# Patient Record
Sex: Female | Born: 1940 | Race: White | Hispanic: No | Smoking: Current every day smoker
Health system: Southern US, Community
[De-identification: ages and names within clinical notes are randomized; demographics above are authoritative.]

## PROBLEM LIST (undated history)

## (undated) DIAGNOSIS — J449 Chronic obstructive pulmonary disease, unspecified: Secondary | ICD-10-CM

## (undated) DIAGNOSIS — I10 Essential (primary) hypertension: Secondary | ICD-10-CM

## (undated) DIAGNOSIS — I4891 Unspecified atrial fibrillation: Secondary | ICD-10-CM

## (undated) DIAGNOSIS — S42009A Fracture of unspecified part of unspecified clavicle, initial encounter for closed fracture: Secondary | ICD-10-CM

## (undated) DIAGNOSIS — K225 Diverticulum of esophagus, acquired: Secondary | ICD-10-CM

## (undated) DIAGNOSIS — N952 Postmenopausal atrophic vaginitis: Secondary | ICD-10-CM

## (undated) HISTORY — DX: Postmenopausal atrophic vaginitis: N95.2

## (undated) HISTORY — PX: HERNIA REPAIR: SHX51

## (undated) HISTORY — DX: Unspecified atrial fibrillation: I48.91

## (undated) HISTORY — DX: Essential (primary) hypertension: I10

## (undated) HISTORY — DX: Chronic obstructive pulmonary disease, unspecified: J44.9

## (undated) HISTORY — DX: Diverticulum of esophagus, acquired: K22.5

---

## 1998-07-28 ENCOUNTER — Emergency Department (HOSPITAL_COMMUNITY): Admission: EM | Admit: 1998-07-28 | Discharge: 1998-07-28 | Payer: Self-pay | Admitting: Emergency Medicine

## 1998-09-30 ENCOUNTER — Other Ambulatory Visit: Admission: RE | Admit: 1998-09-30 | Discharge: 1998-09-30 | Payer: Self-pay | Admitting: Internal Medicine

## 1998-10-15 ENCOUNTER — Encounter (INDEPENDENT_AMBULATORY_CARE_PROVIDER_SITE_OTHER): Payer: Self-pay | Admitting: Specialist

## 1998-10-15 ENCOUNTER — Other Ambulatory Visit: Admission: RE | Admit: 1998-10-15 | Discharge: 1998-10-15 | Payer: Self-pay | Admitting: Obstetrics and Gynecology

## 1998-12-31 ENCOUNTER — Other Ambulatory Visit: Admission: RE | Admit: 1998-12-31 | Discharge: 1998-12-31 | Payer: Self-pay | Admitting: Internal Medicine

## 1999-03-11 ENCOUNTER — Encounter: Admission: RE | Admit: 1999-03-11 | Discharge: 1999-03-11 | Payer: Self-pay | Admitting: *Deleted

## 1999-03-12 ENCOUNTER — Ambulatory Visit (HOSPITAL_BASED_OUTPATIENT_CLINIC_OR_DEPARTMENT_OTHER): Admission: RE | Admit: 1999-03-12 | Discharge: 1999-03-12 | Payer: Self-pay | Admitting: *Deleted

## 1999-12-16 ENCOUNTER — Encounter: Admission: RE | Admit: 1999-12-16 | Discharge: 1999-12-16 | Payer: Self-pay | Admitting: Internal Medicine

## 1999-12-16 ENCOUNTER — Encounter: Payer: Self-pay | Admitting: Internal Medicine

## 2000-02-09 ENCOUNTER — Ambulatory Visit (HOSPITAL_COMMUNITY): Admission: RE | Admit: 2000-02-09 | Discharge: 2000-02-09 | Payer: Self-pay | Admitting: *Deleted

## 2000-02-09 ENCOUNTER — Encounter (INDEPENDENT_AMBULATORY_CARE_PROVIDER_SITE_OTHER): Payer: Self-pay | Admitting: *Deleted

## 2000-11-21 ENCOUNTER — Encounter: Payer: Self-pay | Admitting: Endocrinology

## 2000-11-21 ENCOUNTER — Inpatient Hospital Stay (HOSPITAL_COMMUNITY): Admission: EM | Admit: 2000-11-21 | Discharge: 2000-11-25 | Payer: Self-pay

## 2001-06-09 ENCOUNTER — Encounter: Admission: RE | Admit: 2001-06-09 | Discharge: 2001-06-09 | Payer: Self-pay | Admitting: Family Medicine

## 2001-08-11 ENCOUNTER — Encounter: Admission: RE | Admit: 2001-08-11 | Discharge: 2001-08-11 | Payer: Self-pay | Admitting: Family Medicine

## 2001-08-17 ENCOUNTER — Encounter: Admission: RE | Admit: 2001-08-17 | Discharge: 2001-08-17 | Payer: Self-pay | Admitting: Family Medicine

## 2001-09-15 ENCOUNTER — Encounter: Admission: RE | Admit: 2001-09-15 | Discharge: 2001-09-15 | Payer: Self-pay | Admitting: Family Medicine

## 2001-11-02 ENCOUNTER — Encounter: Admission: RE | Admit: 2001-11-02 | Discharge: 2001-11-02 | Payer: Self-pay | Admitting: Family Medicine

## 2001-12-05 ENCOUNTER — Encounter: Payer: Self-pay | Admitting: Family Medicine

## 2001-12-05 ENCOUNTER — Ambulatory Visit (HOSPITAL_COMMUNITY): Admission: RE | Admit: 2001-12-05 | Discharge: 2001-12-05 | Payer: Self-pay | Admitting: Family Medicine

## 2002-09-07 ENCOUNTER — Encounter: Admission: RE | Admit: 2002-09-07 | Discharge: 2002-09-07 | Payer: Self-pay | Admitting: Sports Medicine

## 2003-01-17 ENCOUNTER — Encounter: Admission: RE | Admit: 2003-01-17 | Discharge: 2003-01-17 | Payer: Self-pay | Admitting: Family Medicine

## 2003-03-21 ENCOUNTER — Encounter: Admission: RE | Admit: 2003-03-21 | Discharge: 2003-03-21 | Payer: Self-pay | Admitting: Family Medicine

## 2003-03-21 ENCOUNTER — Encounter: Admission: RE | Admit: 2003-03-21 | Discharge: 2003-03-21 | Payer: Self-pay | Admitting: Sports Medicine

## 2004-01-21 ENCOUNTER — Ambulatory Visit: Payer: Self-pay | Admitting: Family Medicine

## 2004-02-18 ENCOUNTER — Ambulatory Visit: Payer: Self-pay | Admitting: Family Medicine

## 2004-03-07 ENCOUNTER — Ambulatory Visit (HOSPITAL_COMMUNITY): Admission: RE | Admit: 2004-03-07 | Discharge: 2004-03-07 | Payer: Self-pay | Admitting: Family Medicine

## 2004-03-11 ENCOUNTER — Ambulatory Visit: Payer: Self-pay | Admitting: Family Medicine

## 2004-03-25 ENCOUNTER — Ambulatory Visit: Payer: Self-pay | Admitting: Family Medicine

## 2004-12-18 ENCOUNTER — Ambulatory Visit: Payer: Self-pay | Admitting: Sports Medicine

## 2005-05-12 ENCOUNTER — Ambulatory Visit: Payer: Self-pay | Admitting: Family Medicine

## 2005-05-19 ENCOUNTER — Ambulatory Visit (HOSPITAL_COMMUNITY): Admission: RE | Admit: 2005-05-19 | Discharge: 2005-05-19 | Payer: Self-pay | Admitting: Family Medicine

## 2005-06-09 ENCOUNTER — Ambulatory Visit: Payer: Self-pay | Admitting: Family Medicine

## 2005-06-13 ENCOUNTER — Encounter (INDEPENDENT_AMBULATORY_CARE_PROVIDER_SITE_OTHER): Payer: Self-pay | Admitting: *Deleted

## 2005-06-13 LAB — CONVERTED CEMR LAB

## 2005-06-17 ENCOUNTER — Ambulatory Visit: Payer: Self-pay | Admitting: Family Medicine

## 2005-06-24 ENCOUNTER — Ambulatory Visit (HOSPITAL_COMMUNITY): Admission: RE | Admit: 2005-06-24 | Discharge: 2005-06-24 | Payer: Self-pay | Admitting: Family Medicine

## 2005-09-04 ENCOUNTER — Ambulatory Visit: Payer: Self-pay | Admitting: Family Medicine

## 2005-10-05 ENCOUNTER — Ambulatory Visit: Payer: Self-pay | Admitting: Family Medicine

## 2006-01-13 ENCOUNTER — Ambulatory Visit: Payer: Self-pay | Admitting: Family Medicine

## 2006-01-22 ENCOUNTER — Ambulatory Visit: Payer: Self-pay | Admitting: Family Medicine

## 2006-05-06 DIAGNOSIS — F17200 Nicotine dependence, unspecified, uncomplicated: Secondary | ICD-10-CM | POA: Insufficient documentation

## 2006-05-06 DIAGNOSIS — N952 Postmenopausal atrophic vaginitis: Secondary | ICD-10-CM

## 2006-05-06 DIAGNOSIS — J449 Chronic obstructive pulmonary disease, unspecified: Secondary | ICD-10-CM | POA: Insufficient documentation

## 2006-05-06 DIAGNOSIS — I10 Essential (primary) hypertension: Secondary | ICD-10-CM

## 2006-05-06 DIAGNOSIS — M129 Arthropathy, unspecified: Secondary | ICD-10-CM | POA: Insufficient documentation

## 2006-05-06 HISTORY — DX: Postmenopausal atrophic vaginitis: N95.2

## 2006-05-07 ENCOUNTER — Encounter (INDEPENDENT_AMBULATORY_CARE_PROVIDER_SITE_OTHER): Payer: Self-pay | Admitting: *Deleted

## 2006-06-07 ENCOUNTER — Encounter (INDEPENDENT_AMBULATORY_CARE_PROVIDER_SITE_OTHER): Payer: Self-pay | Admitting: Family Medicine

## 2006-07-12 ENCOUNTER — Encounter (INDEPENDENT_AMBULATORY_CARE_PROVIDER_SITE_OTHER): Payer: Self-pay | Admitting: Family Medicine

## 2006-08-18 ENCOUNTER — Ambulatory Visit (HOSPITAL_COMMUNITY): Admission: RE | Admit: 2006-08-18 | Discharge: 2006-08-18 | Payer: Self-pay | Admitting: Family Medicine

## 2006-08-18 ENCOUNTER — Encounter (INDEPENDENT_AMBULATORY_CARE_PROVIDER_SITE_OTHER): Payer: Self-pay | Admitting: Family Medicine

## 2006-09-01 ENCOUNTER — Ambulatory Visit: Payer: Self-pay | Admitting: Family Medicine

## 2006-09-01 DIAGNOSIS — R1310 Dysphagia, unspecified: Secondary | ICD-10-CM | POA: Insufficient documentation

## 2006-09-06 ENCOUNTER — Ambulatory Visit: Payer: Self-pay | Admitting: Family Medicine

## 2006-09-06 ENCOUNTER — Encounter: Payer: Self-pay | Admitting: Family Medicine

## 2006-09-06 ENCOUNTER — Telehealth: Payer: Self-pay | Admitting: *Deleted

## 2006-09-06 LAB — CONVERTED CEMR LAB
ALT: 18 units/L (ref 0–35)
AST: 20 units/L (ref 0–37)
Albumin: 4.7 g/dL (ref 3.5–5.2)
Alkaline Phosphatase: 96 units/L (ref 39–117)
BUN: 17 mg/dL (ref 6–23)
Glucose, Bld: 88 mg/dL (ref 70–99)
MCV: 96.3 fL
Potassium: 4.9 meq/L (ref 3.5–5.3)
RBC: 4.91 M/uL
TSH: 3.482 microintl units/mL (ref 0.350–5.50)
Total Bilirubin: 0.5 mg/dL (ref 0.3–1.2)
Total Protein: 7.6 g/dL (ref 6.0–8.3)
Triglycerides: 123 mg/dL (ref <150)
VLDL: 25 mg/dL (ref 0–40)
WBC: 5.6 10*3/uL

## 2006-09-07 ENCOUNTER — Ambulatory Visit (HOSPITAL_COMMUNITY): Admission: RE | Admit: 2006-09-07 | Discharge: 2006-09-07 | Payer: Self-pay | Admitting: Sports Medicine

## 2006-09-09 ENCOUNTER — Encounter: Payer: Self-pay | Admitting: Family Medicine

## 2006-09-13 ENCOUNTER — Telehealth: Payer: Self-pay | Admitting: *Deleted

## 2006-09-13 ENCOUNTER — Ambulatory Visit: Payer: Self-pay | Admitting: Sports Medicine

## 2006-09-16 ENCOUNTER — Ambulatory Visit: Payer: Self-pay | Admitting: Family Medicine

## 2006-10-04 ENCOUNTER — Ambulatory Visit: Payer: Self-pay | Admitting: Family Medicine

## 2006-10-04 DIAGNOSIS — K225 Diverticulum of esophagus, acquired: Secondary | ICD-10-CM

## 2006-10-04 HISTORY — DX: Diverticulum of esophagus, acquired: K22.5

## 2006-10-11 ENCOUNTER — Encounter: Payer: Self-pay | Admitting: Family Medicine

## 2006-10-12 ENCOUNTER — Encounter (INDEPENDENT_AMBULATORY_CARE_PROVIDER_SITE_OTHER): Payer: Self-pay | Admitting: *Deleted

## 2006-10-27 ENCOUNTER — Telehealth (INDEPENDENT_AMBULATORY_CARE_PROVIDER_SITE_OTHER): Payer: Self-pay | Admitting: *Deleted

## 2006-11-02 ENCOUNTER — Encounter: Payer: Self-pay | Admitting: Family Medicine

## 2006-11-04 ENCOUNTER — Telehealth: Payer: Self-pay | Admitting: *Deleted

## 2006-11-24 ENCOUNTER — Encounter: Payer: Self-pay | Admitting: Family Medicine

## 2006-12-15 ENCOUNTER — Ambulatory Visit: Payer: Self-pay | Admitting: Family Medicine

## 2006-12-30 ENCOUNTER — Encounter: Payer: Self-pay | Admitting: Family Medicine

## 2007-01-03 ENCOUNTER — Encounter: Payer: Self-pay | Admitting: Family Medicine

## 2007-02-17 ENCOUNTER — Ambulatory Visit: Payer: Self-pay | Admitting: Family Medicine

## 2007-02-18 ENCOUNTER — Encounter: Payer: Self-pay | Admitting: Family Medicine

## 2007-02-23 ENCOUNTER — Telehealth: Payer: Self-pay | Admitting: *Deleted

## 2007-02-24 ENCOUNTER — Ambulatory Visit: Payer: Self-pay | Admitting: Family Medicine

## 2007-02-24 ENCOUNTER — Observation Stay (HOSPITAL_COMMUNITY): Admission: AD | Admit: 2007-02-24 | Discharge: 2007-02-25 | Payer: Self-pay | Admitting: Family Medicine

## 2007-03-11 ENCOUNTER — Ambulatory Visit: Payer: Self-pay | Admitting: Family Medicine

## 2007-03-14 ENCOUNTER — Telehealth (INDEPENDENT_AMBULATORY_CARE_PROVIDER_SITE_OTHER): Payer: Self-pay | Admitting: *Deleted

## 2007-03-16 ENCOUNTER — Encounter: Payer: Self-pay | Admitting: Family Medicine

## 2007-04-21 ENCOUNTER — Encounter: Payer: Self-pay | Admitting: Family Medicine

## 2007-05-02 ENCOUNTER — Encounter: Payer: Self-pay | Admitting: Family Medicine

## 2007-07-12 ENCOUNTER — Encounter: Payer: Self-pay | Admitting: Family Medicine

## 2007-08-15 ENCOUNTER — Encounter: Payer: Self-pay | Admitting: Family Medicine

## 2007-08-26 ENCOUNTER — Ambulatory Visit (HOSPITAL_COMMUNITY): Admission: RE | Admit: 2007-08-26 | Discharge: 2007-08-26 | Payer: Self-pay | Admitting: *Deleted

## 2007-10-17 ENCOUNTER — Telehealth: Payer: Self-pay | Admitting: Family Medicine

## 2007-10-27 ENCOUNTER — Telehealth (INDEPENDENT_AMBULATORY_CARE_PROVIDER_SITE_OTHER): Payer: Self-pay | Admitting: *Deleted

## 2007-11-09 ENCOUNTER — Ambulatory Visit: Payer: Self-pay | Admitting: Family Medicine

## 2007-11-10 ENCOUNTER — Encounter: Payer: Self-pay | Admitting: Family Medicine

## 2007-11-21 ENCOUNTER — Encounter: Payer: Self-pay | Admitting: Family Medicine

## 2007-11-29 ENCOUNTER — Encounter: Payer: Self-pay | Admitting: Emergency Medicine

## 2007-11-29 ENCOUNTER — Inpatient Hospital Stay (HOSPITAL_COMMUNITY): Admission: EM | Admit: 2007-11-29 | Discharge: 2007-12-01 | Payer: Self-pay | Admitting: Family Medicine

## 2007-11-29 ENCOUNTER — Ambulatory Visit: Payer: Self-pay | Admitting: Cardiology

## 2007-11-29 ENCOUNTER — Encounter: Payer: Self-pay | Admitting: Family Medicine

## 2007-11-29 ENCOUNTER — Ambulatory Visit: Payer: Self-pay | Admitting: Family Medicine

## 2007-12-20 ENCOUNTER — Ambulatory Visit: Payer: Self-pay | Admitting: Family Medicine

## 2007-12-20 DIAGNOSIS — Z8679 Personal history of other diseases of the circulatory system: Secondary | ICD-10-CM

## 2008-01-12 ENCOUNTER — Telehealth: Payer: Self-pay | Admitting: Family Medicine

## 2008-01-13 ENCOUNTER — Ambulatory Visit: Payer: Self-pay | Admitting: Cardiology

## 2008-01-19 ENCOUNTER — Ambulatory Visit: Payer: Self-pay

## 2008-01-19 ENCOUNTER — Ambulatory Visit: Payer: Self-pay | Admitting: Cardiology

## 2008-01-19 ENCOUNTER — Encounter: Payer: Self-pay | Admitting: Family Medicine

## 2008-01-23 ENCOUNTER — Ambulatory Visit: Payer: Self-pay | Admitting: Cardiology

## 2008-01-25 ENCOUNTER — Encounter: Payer: Self-pay | Admitting: Family Medicine

## 2008-01-25 ENCOUNTER — Ambulatory Visit: Payer: Self-pay | Admitting: Family Medicine

## 2008-01-30 ENCOUNTER — Ambulatory Visit: Payer: Self-pay | Admitting: Internal Medicine

## 2008-01-30 LAB — CONVERTED CEMR LAB
Chloride: 97 meq/L (ref 96–112)
Creatinine, Ser: 0.82 mg/dL (ref 0.40–1.20)
Sodium: 136 meq/L (ref 135–145)

## 2008-02-08 ENCOUNTER — Encounter: Payer: Self-pay | Admitting: Family Medicine

## 2008-02-09 ENCOUNTER — Ambulatory Visit: Payer: Self-pay | Admitting: Cardiology

## 2008-02-10 ENCOUNTER — Ambulatory Visit: Payer: Self-pay | Admitting: Family Medicine

## 2008-02-14 ENCOUNTER — Ambulatory Visit: Payer: Self-pay | Admitting: Family Medicine

## 2008-02-14 ENCOUNTER — Telehealth: Payer: Self-pay | Admitting: *Deleted

## 2008-02-15 ENCOUNTER — Telehealth: Payer: Self-pay | Admitting: *Deleted

## 2008-02-20 ENCOUNTER — Ambulatory Visit: Payer: Self-pay | Admitting: Cardiology

## 2008-02-28 ENCOUNTER — Ambulatory Visit: Payer: Self-pay | Admitting: Internal Medicine

## 2008-03-14 ENCOUNTER — Ambulatory Visit: Payer: Self-pay | Admitting: Cardiology

## 2008-03-16 ENCOUNTER — Encounter: Payer: Self-pay | Admitting: Family Medicine

## 2008-03-23 ENCOUNTER — Encounter (INDEPENDENT_AMBULATORY_CARE_PROVIDER_SITE_OTHER): Payer: Self-pay | Admitting: Cardiology

## 2008-03-23 ENCOUNTER — Ambulatory Visit: Payer: Self-pay | Admitting: Cardiology

## 2008-03-30 ENCOUNTER — Ambulatory Visit: Payer: Self-pay | Admitting: Internal Medicine

## 2008-04-11 ENCOUNTER — Ambulatory Visit: Payer: Self-pay | Admitting: Cardiology

## 2008-04-25 ENCOUNTER — Ambulatory Visit: Payer: Self-pay | Admitting: Cardiology

## 2008-05-02 ENCOUNTER — Ambulatory Visit: Payer: Self-pay | Admitting: Cardiology

## 2008-05-16 ENCOUNTER — Ambulatory Visit: Payer: Self-pay | Admitting: Cardiovascular Disease

## 2008-06-06 ENCOUNTER — Ambulatory Visit: Payer: Self-pay | Admitting: Cardiology

## 2008-06-27 ENCOUNTER — Ambulatory Visit: Payer: Self-pay | Admitting: Internal Medicine

## 2008-07-06 ENCOUNTER — Ambulatory Visit: Payer: Self-pay | Admitting: Cardiology

## 2008-07-09 ENCOUNTER — Ambulatory Visit: Payer: Self-pay | Admitting: Family Medicine

## 2008-07-19 ENCOUNTER — Ambulatory Visit: Payer: Self-pay | Admitting: Cardiovascular Disease

## 2008-07-19 LAB — CONVERTED CEMR LAB: INR: 5.2 — ABNORMAL HIGH (ref 0.8–1.0)

## 2008-07-26 ENCOUNTER — Ambulatory Visit: Payer: Self-pay | Admitting: Internal Medicine

## 2008-08-07 ENCOUNTER — Encounter: Payer: Self-pay | Admitting: *Deleted

## 2008-08-09 ENCOUNTER — Ambulatory Visit: Payer: Self-pay | Admitting: Internal Medicine

## 2008-08-09 LAB — CONVERTED CEMR LAB: Protime: 20.2

## 2008-08-30 ENCOUNTER — Ambulatory Visit: Payer: Self-pay | Admitting: Internal Medicine

## 2008-09-12 ENCOUNTER — Encounter: Payer: Self-pay | Admitting: *Deleted

## 2008-09-20 ENCOUNTER — Ambulatory Visit: Payer: Self-pay

## 2008-09-24 ENCOUNTER — Encounter (INDEPENDENT_AMBULATORY_CARE_PROVIDER_SITE_OTHER): Payer: Self-pay | Admitting: *Deleted

## 2008-10-08 ENCOUNTER — Telehealth: Payer: Self-pay | Admitting: Family Medicine

## 2008-10-09 ENCOUNTER — Encounter: Payer: Self-pay | Admitting: Family Medicine

## 2008-10-09 ENCOUNTER — Ambulatory Visit: Payer: Self-pay | Admitting: Family Medicine

## 2008-10-09 DIAGNOSIS — R609 Edema, unspecified: Secondary | ICD-10-CM

## 2008-10-10 LAB — CONVERTED CEMR LAB
AST: 17 units/L (ref 0–37)
Albumin: 4.4 g/dL (ref 3.5–5.2)
Alkaline Phosphatase: 96 units/L (ref 39–117)
BUN: 15 mg/dL (ref 6–23)
Calcium: 10.2 mg/dL (ref 8.4–10.5)
Chloride: 102 meq/L (ref 96–112)
TSH: 1.61 microintl units/mL (ref 0.350–4.500)
Total Bilirubin: 0.5 mg/dL (ref 0.3–1.2)
Total Protein: 7.2 g/dL (ref 6.0–8.3)

## 2008-10-11 ENCOUNTER — Ambulatory Visit: Payer: Self-pay | Admitting: Cardiology

## 2008-10-12 ENCOUNTER — Encounter: Payer: Self-pay | Admitting: Family Medicine

## 2008-10-12 LAB — CONVERTED CEMR LAB
Collection Interval-CRCL: 24 hr
Creatinine Clearance: 74 mL/min — ABNORMAL LOW (ref 75–115)
Creatinine, Urine: 40.5 mg/dL
Protein, Ur: 44 mg/24hr — ABNORMAL LOW (ref 50–100)

## 2008-10-25 ENCOUNTER — Ambulatory Visit: Payer: Self-pay | Admitting: Cardiology

## 2008-10-25 LAB — CONVERTED CEMR LAB: POC INR: 2.4

## 2008-11-06 DIAGNOSIS — M810 Age-related osteoporosis without current pathological fracture: Secondary | ICD-10-CM | POA: Insufficient documentation

## 2008-11-06 DIAGNOSIS — F329 Major depressive disorder, single episode, unspecified: Secondary | ICD-10-CM

## 2008-11-07 ENCOUNTER — Encounter: Payer: Self-pay | Admitting: Cardiology

## 2008-11-07 ENCOUNTER — Ambulatory Visit: Payer: Self-pay | Admitting: Cardiology

## 2008-11-07 DIAGNOSIS — R0602 Shortness of breath: Secondary | ICD-10-CM

## 2008-11-07 DIAGNOSIS — R0989 Other specified symptoms and signs involving the circulatory and respiratory systems: Secondary | ICD-10-CM

## 2008-11-15 ENCOUNTER — Ambulatory Visit: Payer: Self-pay | Admitting: Cardiology

## 2008-11-15 ENCOUNTER — Ambulatory Visit: Payer: Self-pay | Admitting: Cardiovascular Disease

## 2008-11-16 LAB — CONVERTED CEMR LAB
Eosinophils Absolute: 0 10*3/uL (ref 0.0–0.7)
Eosinophils Relative: 0.1 % (ref 0.0–5.0)
HCT: 44.8 % (ref 36.0–46.0)
MCV: 97.1 fL (ref 78.0–100.0)
Monocytes Absolute: 0.6 10*3/uL (ref 0.1–1.0)
Neutro Abs: 4.6 10*3/uL (ref 1.4–7.7)
Neutrophils Relative %: 57.3 % (ref 43.0–77.0)
RDW: 12.2 % (ref 11.5–14.6)

## 2008-11-19 ENCOUNTER — Telehealth: Payer: Self-pay | Admitting: Family Medicine

## 2008-11-22 ENCOUNTER — Ambulatory Visit: Payer: Self-pay

## 2008-11-22 ENCOUNTER — Encounter: Payer: Self-pay | Admitting: Cardiology

## 2008-11-29 ENCOUNTER — Ambulatory Visit: Payer: Self-pay | Admitting: Internal Medicine

## 2008-12-06 ENCOUNTER — Encounter: Payer: Self-pay | Admitting: Family Medicine

## 2008-12-13 ENCOUNTER — Ambulatory Visit: Payer: Self-pay | Admitting: Internal Medicine

## 2008-12-13 LAB — CONVERTED CEMR LAB: POC INR: 3.1

## 2008-12-21 ENCOUNTER — Ambulatory Visit: Payer: Self-pay | Admitting: Family Medicine

## 2009-01-02 ENCOUNTER — Ambulatory Visit (HOSPITAL_COMMUNITY): Admission: RE | Admit: 2009-01-02 | Discharge: 2009-01-02 | Payer: Self-pay | Admitting: *Deleted

## 2009-01-03 ENCOUNTER — Ambulatory Visit: Payer: Self-pay | Admitting: Cardiology

## 2009-01-28 ENCOUNTER — Ambulatory Visit: Payer: Self-pay | Admitting: Internal Medicine

## 2009-02-25 ENCOUNTER — Encounter (INDEPENDENT_AMBULATORY_CARE_PROVIDER_SITE_OTHER): Payer: Self-pay | Admitting: Cardiology

## 2009-02-25 ENCOUNTER — Ambulatory Visit: Payer: Self-pay | Admitting: Cardiology

## 2009-02-25 LAB — CONVERTED CEMR LAB: POC INR: 2.4

## 2009-03-25 ENCOUNTER — Ambulatory Visit: Payer: Self-pay | Admitting: Cardiology

## 2009-03-25 LAB — CONVERTED CEMR LAB: POC INR: 2.6

## 2009-04-22 ENCOUNTER — Encounter (INDEPENDENT_AMBULATORY_CARE_PROVIDER_SITE_OTHER): Payer: Self-pay | Admitting: Cardiology

## 2009-04-22 ENCOUNTER — Ambulatory Visit: Payer: Self-pay | Admitting: Cardiology

## 2009-05-16 ENCOUNTER — Ambulatory Visit: Payer: Self-pay | Admitting: Family Medicine

## 2009-05-16 ENCOUNTER — Encounter: Payer: Self-pay | Admitting: Family Medicine

## 2009-05-20 ENCOUNTER — Ambulatory Visit: Payer: Self-pay | Admitting: Internal Medicine

## 2009-05-20 LAB — CONVERTED CEMR LAB: POC INR: 2

## 2009-06-17 ENCOUNTER — Ambulatory Visit: Payer: Self-pay | Admitting: Cardiovascular Disease

## 2009-06-17 LAB — CONVERTED CEMR LAB: POC INR: 2.2

## 2009-07-15 ENCOUNTER — Ambulatory Visit: Payer: Self-pay | Admitting: Cardiology

## 2009-08-12 ENCOUNTER — Ambulatory Visit: Payer: Self-pay | Admitting: Cardiology

## 2009-08-12 ENCOUNTER — Ambulatory Visit: Payer: Self-pay | Admitting: Family Medicine

## 2009-08-13 ENCOUNTER — Telehealth: Payer: Self-pay | Admitting: Family Medicine

## 2009-08-15 ENCOUNTER — Encounter: Payer: Self-pay | Admitting: Family Medicine

## 2009-08-16 ENCOUNTER — Ambulatory Visit: Payer: Self-pay | Admitting: Cardiovascular Disease

## 2009-08-21 ENCOUNTER — Encounter: Payer: Self-pay | Admitting: Cardiology

## 2009-08-21 ENCOUNTER — Ambulatory Visit: Payer: Self-pay | Admitting: Internal Medicine

## 2009-08-21 ENCOUNTER — Ambulatory Visit: Payer: Self-pay | Admitting: Cardiology

## 2009-08-26 LAB — CONVERTED CEMR LAB
BUN: 20 mg/dL (ref 6–23)
Chloride: 97 meq/L (ref 96–112)
Hemoglobin: 15.8 g/dL — ABNORMAL HIGH (ref 12.0–15.0)
MCV: 94.2 fL (ref 78.0–100.0)
Potassium: 5.2 meq/L (ref 3.5–5.3)

## 2009-09-11 ENCOUNTER — Ambulatory Visit: Payer: Self-pay | Admitting: Cardiology

## 2009-09-11 ENCOUNTER — Ambulatory Visit: Payer: Self-pay | Admitting: Internal Medicine

## 2009-09-11 DIAGNOSIS — D7289 Other specified disorders of white blood cells: Secondary | ICD-10-CM | POA: Insufficient documentation

## 2009-09-11 LAB — CONVERTED CEMR LAB: POC INR: 2.8

## 2009-09-12 LAB — CONVERTED CEMR LAB
Basophils Relative: 0.6 % (ref 0.0–3.0)
Eosinophils Relative: 0.5 % (ref 0.0–5.0)
HCT: 42.7 % (ref 36.0–46.0)
Hemoglobin: 14.5 g/dL (ref 12.0–15.0)
Lymphs Abs: 2.6 10*3/uL (ref 0.7–4.0)
MCV: 97.8 fL (ref 78.0–100.0)
Monocytes Relative: 8.4 % (ref 3.0–12.0)
Neutro Abs: 5.6 10*3/uL (ref 1.4–7.7)
WBC: 9.1 10*3/uL (ref 4.5–10.5)

## 2009-10-09 ENCOUNTER — Ambulatory Visit: Payer: Self-pay | Admitting: Cardiology

## 2009-11-12 ENCOUNTER — Ambulatory Visit: Payer: Self-pay | Admitting: Cardiovascular Disease

## 2009-12-11 ENCOUNTER — Ambulatory Visit: Payer: Self-pay | Admitting: Cardiovascular Disease

## 2010-01-08 ENCOUNTER — Ambulatory Visit: Payer: Self-pay | Admitting: Cardiology

## 2010-01-08 LAB — CONVERTED CEMR LAB: POC INR: 2.2

## 2010-01-09 ENCOUNTER — Telehealth: Payer: Self-pay | Admitting: Family Medicine

## 2010-01-28 ENCOUNTER — Ambulatory Visit: Payer: Self-pay | Admitting: Family Medicine

## 2010-02-05 ENCOUNTER — Ambulatory Visit: Payer: Self-pay | Admitting: Internal Medicine

## 2010-02-05 LAB — CONVERTED CEMR LAB: POC INR: 3.8

## 2010-02-11 ENCOUNTER — Encounter: Payer: Self-pay | Admitting: Family Medicine

## 2010-02-14 ENCOUNTER — Telehealth: Payer: Self-pay | Admitting: Family Medicine

## 2010-02-26 ENCOUNTER — Ambulatory Visit: Payer: Self-pay | Admitting: Cardiology

## 2010-03-07 ENCOUNTER — Telehealth (INDEPENDENT_AMBULATORY_CARE_PROVIDER_SITE_OTHER): Payer: Self-pay | Admitting: *Deleted

## 2010-03-13 ENCOUNTER — Ambulatory Visit: Admission: RE | Admit: 2010-03-13 | Discharge: 2010-03-13 | Payer: Self-pay | Source: Home / Self Care

## 2010-03-19 ENCOUNTER — Ambulatory Visit: Admission: RE | Admit: 2010-03-19 | Discharge: 2010-03-19 | Payer: Self-pay | Source: Home / Self Care

## 2010-03-19 LAB — CONVERTED CEMR LAB: POC INR: 2.4

## 2010-04-09 DIAGNOSIS — I4891 Unspecified atrial fibrillation: Secondary | ICD-10-CM

## 2010-04-09 DIAGNOSIS — Z7901 Long term (current) use of anticoagulants: Secondary | ICD-10-CM | POA: Insufficient documentation

## 2010-04-10 NOTE — Letter (Signed)
Summary: Handout Printed  Printed Handout:  - Coumadin Instructions-w/out Meds 

## 2010-04-10 NOTE — Miscellaneous (Signed)
  Clinical Lists Changes  Observations: Added new observation of COLONNXTDUE: 02/10/2010 (05/16/2009 15:45) Added new observation of DM PROGRESS: N/A (05/16/2009 15:45) Added new observation of DM FSREVIEW: N/A (05/16/2009 15:45) Added new observation of LIPID PROGRS: N/A (05/16/2009 15:45) Added new observation of LIPID FSREVW: N/A (05/16/2009 15:45)      Prevention & Chronic Care Immunizations   Influenza vaccine: Fluvax Non-MCR  (12/21/2008)   Influenza vaccine due: 11/27/2008    Tetanus booster: 09/06/2001: Done.   Tetanus booster due: 09/07/2011    Pneumococcal vaccine: Done.  (09/06/2001)   Pneumococcal vaccine due: None    H. zoster vaccine: Not documented  Colorectal Screening   Hemoccult: Not documented    Colonoscopy: Done.  (12/08/1999)   Colonoscopy due: 02/10/2010  Other Screening   Pap smear: normal  (06/13/2005)   Pap smear due: 06/14/2006    Mammogram: ASSESSMENT: Negative - BI-RADS 1^MM DIGITAL SCREENING  (01/02/2009)   Mammogram action/deferral: Screening mammogram in 1 year.     (08/18/2006)   Mammogram due: 09/04/2008    DXA bone density scan: Done.  (06/07/2005)   DXA scan due: None    Smoking status: current  (05/16/2009)   Smoking cessation counseling: yes  (12/20/2007)  Lipids   Total Cholesterol: 241  (09/06/2006)   LDL: 131  (09/06/2006)   LDL Direct: Not documented   HDL: 85  (09/06/2006)   Triglycerides: 123  (09/06/2006)  Hypertension   Last Blood Pressure: 151 / 81  (05/16/2009)   Serum creatinine: 0.84  (10/09/2008)   Serum potassium 4.5  (10/09/2008)  Self-Management Support :    Hypertension self-management support: Not documented

## 2010-04-10 NOTE — Medication Information (Signed)
Summary: rov.mp  Anticoagulant Therapy  Managed by: Cloyde Reams, RN, BSN PCP: Ardeen Garland  MD Supervising MD: Jens Som MD, Arlys John Indication 1: Atrial Fibrillation (ICD-427.31) Lab Used: [facility] [place] Site: [address] INR POC 2.6 INR RANGE 2 - 3  Dietary changes: no    Health status changes: no    Bleeding/hemorrhagic complications: no    Recent/future hospitalizations: no    Any changes in medication regimen? yes       Details: Starting Chantix tomorrow.    Recent/future dental: no  Any missed doses?: no       Is patient compliant with meds? yes       Allergies (verified): 1)  ! Doxycycline  Anticoagulation Management History:      The patient is taking warfarin and comes in today for a routine follow up visit.  Positive risk factors for bleeding include an age of 70 years or older.  The bleeding index is 'intermediate risk'.  Positive CHADS2 values include History of HTN.  Negative CHADS2 values include Age > 71 years old.  The start date was 01/13/2008.  Her last INR was 5.2 ratio.  Anticoagulation responsible provider: Jens Som MD, Arlys John.  INR POC: 2.6.  Cuvette Lot#: [id number].  Exp: 06/2010.    Anticoagulation Management Assessment/Plan:      The patient's current anticoagulation dose is Coumadin 5 mg tabs: Take as directed by coumadin clinic..  The target INR is 2.0-3.0.  The next INR is due 04/22/2009.  Anticoagulation instructions were given to patient.  Results were reviewed/authorized by Cloyde Reams, RN, BSN.  She was notified by Cloyde Reams RN.         Prior Anticoagulation Instructions: INR 2.4  Continue taking 1 tab on each Tuesday and Saturday, and 0.5 tab on all other days.    Recheck in 4 weeks.    Current Anticoagulation Instructions: INR 2.6  Continue on same dosage 1/2 tablet daily except 1 tablet on Tuesdays and Saturdays.   Recheck in 4 weeks.

## 2010-04-10 NOTE — Assessment & Plan Note (Signed)
Summary: [facility]   Visit Type:  9 months follow up Primary Provider:  Ardeen Garland  MD  CC:  No complains.  History of Present Illness: [name] is a very pleasant female who has a history of COPD as well as paroxysmal atrial fibrillation.  I last saw her in Sept of 2010. A Myoview   performed on January 19, 2008 revealed inferoseptal thinning, but there was no scar or ischemia.  Her ejection fraction was 76%.  Note, her LV function was normal by echocardiogram performed on November 29, 2007 with an ejection fraction of 60-70%. A previous TSH was normal. Abdominal ultrasound was normal with no aneurysm in September 2009. Carotid Dopplers were normal in September 2009. Since I last saw her she has dyspnea with exertion relieved with rest. It is unchanged compared to previous. There is no associated chest pain. There is no orthopnea, PND,  or syncope. She has some pedal edema towards the end of the day that improves overnight. She occasionally feels brief palpitations but not sustained. There is no associated symptoms.   Current Medications (verified): 1)  Singulair 10 Mg  Tabs (Montelukast Sodium) .Marland Kitchen.. 1 Tab Once Daily 2)  Advair Diskus 500-50 Mcg/dose  Misc (Fluticasone-Salmeterol) .Marland Kitchen.. 1 Puff Bid 3)  Proventil Hfa 108 (90 Base) Mcg/act Aers (Albuterol Sulfate) .... Inhale 2 Puff Using Inhaler Every Four Hours 4)  Lisinopril 10 Mg  Tabs (Lisinopril) .... Take 1 Tab By Mouth Daily 5)  Hydrochlorothiazide 12.5 Mg Caps (Hydrochlorothiazide) .Marland Kitchen.. 1 By Mouth Daily For High Blood Pressure 6)  Diltiazem Hcl 90 Mg Tabs (Diltiazem Hcl) .... Take 2 Tablets Two Times A Day 7)  Xyzal 5 Mg Tabs (Levocetirizine Dihydrochloride) .... 2 Tabs By Mouth Daily 8)  Coumadin 5 Mg Tabs (Warfarin Sodium) .... Take As Directed By Coumadin Clinic. 9)  Prednisone 20 Mg Tabs (Prednisone) .... Take 2 Tabs By Mouth Daily X 10 Days Qs. Last Dose Today  Allergies: 1)  ! 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 Colonoscopy  2001  WNL - 08/17/2001 Endoscopy Dr. Virginia Rochester 2001   WNL - 08/17/2001 Right inguinal hernia repair 2000 - 08/17/2001 Tubal ligation - 08/17/2001  Social History: Reviewed history from 11/29/2007 and no changes required. Second marriage for 10 years, lives with husband.  3 grown children. Currently uinemployed. Tobacco 1 ppd, remote etoh, no drug use. Smokes since age 34.  Review of Systems       no fevers or chills, productive cough, hemoptysis, dysphasia, odynophagia, melena, hematochezia, dysuria, hematuria, rash, seizure activity, orthopnea, PND,  claudication. Remaining systems are negative.   Vital Signs:  Patient profile:   70 year old female Height:      63 inches Weight:      100.31 pounds BMI:     17.83 Pulse rate:   81 / minute Pulse rhythm:   regular Resp:     18 per minute BP sitting:   128 / 64  (left arm) Cuff size:   regular  Vitals Entered By: Vikki Ports (August 21, 2009 11:46 AM)  Physical Exam  General:  Well-developed frail in  no acute distress.  Skin is warm and dry.  HEENT is normal.  Neck is supple. No thyromegaly.  Chest reveals diffuse expiratory wheezing Cardiovascular exam is regular rate and rhythm.  Abdominal exam nontender or distended. No masses palpated. Extremities show trace edema. neuro grossly intact    EKG  Procedure date:  08/21/2009  Findings:      Normal sinus rhythm at a rate of  81. Cannot rule out prior septal infarct. Minor nonspecific ST changes.  Impression & Recommendations:  Problem # 1:  COUMADIN THERAPY (ICD-V58.61) Monitored in the Coumadin clinic. Goal INR 2-3. Check CBC. Orders: T-CBC No Diff ([id number])  Problem # 2:  DYSPNEA (ICD-786.05) Most likely secondary to COPD. Her updated medication list for this problem includes:    Lisinopril 10 Mg Tabs (Lisinopril) .Marland Kitchen... Take 1 tab by mouth daily    Hydrochlorothiazide 12.5 Mg Caps (Hydrochlorothiazide) .Marland Kitchen... 1 by mouth daily for high blood pressure    Diltiazem Hcl 90 Mg Tabs (Diltiazem hcl) .Marland Kitchen... Take 2 tablets two times a day  Problem # 3:  ATRIAL FIBRILLATION, HX OF (ICD-V12.59) Patient remains in sinus rhythm. She cannot take long-acting Cardizem for financial reasons. I've instructed her to take regular Cardizem 4 times daily. Continue Coumadin. Her updated medication list for this problem includes:    Lisinopril 10 Mg Tabs (Lisinopril) .Marland Kitchen... Take 1 tab by mouth daily    Diltiazem Hcl 90 Mg Tabs (Diltiazem hcl) .Marland Kitchen... Take 2 tablets two times a day    Coumadin 5 Mg Tabs (Warfarin sodium) .Marland Kitchen... Take as directed by coumadin clinic.  Problem # 4:  HYPERTENSION, BENIGN SYSTEMIC (ICD-401.1) Blood pressure controlled on present medications. Will continue. Her updated medication list for this problem includes:    Lisinopril 10 Mg Tabs (Lisinopril) .Marland Kitchen... Take 1 tab by mouth daily    Hydrochlorothiazide 12.5 Mg Caps (Hydrochlorothiazide) .Marland Kitchen... 1 by mouth daily for high blood pressure    Diltiazem Hcl 90 Mg Tabs (Diltiazem hcl) .Marland Kitchen... Take 2 tablets two times a day  Orders: T-Basic Metabolic Panel [phone])  Problem # 5:  COPD (ICD-496) Management per primary care. Her updated medication list for this problem includes:    Singulair 10 Mg Tabs (Montelukast sodium) .Marland Kitchen... 1 tab once daily    Advair Diskus 500-50 Mcg/dose Misc (Fluticasone-salmeterol) .Marland Kitchen... 1 puff bid    Proventil Hfa 108 (90 Base)  Mcg/act Aers (Albuterol sulfate) ..... Inhale 2 puff using inhaler every four hours  Problem # 6:  TOBACCO DEPENDENCE (ICD-305.1) Patient counseled on discontinuing.  Patient Instructions: 1)  Your physician recommends that you schedule a follow-up appointment in: ONE YEAR

## 2010-04-10 NOTE — Assessment & Plan Note (Signed)
Summary: f/u,df   Vital Signs:  Patient profile:   70 year old female Height:      63 inches Weight:      104 pounds BMI:     18.49 Temp:     97.7 degrees F oral Pulse rate:   92 / minute BP sitting:   151 / 81  (left arm) Cuff size:   regular  Vitals Entered By: Tessie Fass CMA (May 16, 2009 1:44 PM) CC: F/U BP and COPD Is Patient Diabetic? No Pain Assessment Patient in pain? no        Primary Care Provider:  Ardeen Garland  MD  CC:  F/U BP and COPD.  History of Present Illness: Ms. Roskelley comes in today for follow-up of HTN and COPD 1) HTN - on diltiazem (mostly for Afib), lisinopril, and HCTZ.  Taking them as directed.  Would like ot go back to dilt capsules as tehy are easier for her to swallow.  Has PAF and is on coumadin, managed by [facility].  Has h/o INR increasing when on prednisone for COPD. 2) COPD - has been doing very well on her current regimen.  This week has had slight URI with increased congestion and wheezing.  Needing Albuterol once a day (otherwise does not need it).  No fevers or productive cough.   3) preventive - uptodate on mammo.  Does not do paps anymore.  Unsure when her last colonscopy was.  [facility]ates was done [facility] but can't remember the doctor or what year.    Habits & Providers  Alcohol-Tobacco-Diet     Tobacco Status: current     Cigarette Packs/Day: 1.0  Allergies: 1)  ! Doxycycline  Physical Exam  General:  Well-developed,well-nourished,in no acute distress; alert,appropriate and cooperative throughout examination Lungs:  diffusely decreased breath sounds.  No wheezing or crackles.  Normal work of breathing Heart:  RRR without murmur Pulses:  2+ radjial Extremities:  no edema   Impression & Recommendations:  Problem # 1:  HYPERTENSION, BENIGN SYSTEMIC (ICD-401.1) Assessment Unchanged  Well-controlled.  BP at goal on recheck.  Continue current regimen. Dilt changed to capsules. Her updated medication list  for this problem includes:    Lisinopril 10 Mg Tabs (Lisinopril) .Marland Kitchen... Take 1 tab by mouth daily    Hydrochlorothiazide 12.5 Mg Caps (Hydrochlorothiazide) .Marland Kitchen... 1 by mouth daily for high blood pressure    Diltiazem Hcl Cr 90 Mg Xr12h-cap (Diltiazem hcl) .Marland Kitchen... 2 tabs by mouth bid  Orders: [facility]- Est Level  3 ([address])  Problem # 2:  COPD (ICD-496) Assessment: Unchanged  Stable.  Continue current regimen.  No sign of exacerbation at this time.  Her updated medication list for this problem includes:    Singulair 10 Mg Tabs (Montelukast sodium) .Marland Kitchen... 1 tab once daily    Advair Diskus 500-50 Mcg/dose Misc (Fluticasone-salmeterol) .Marland Kitchen... 1 puff bid    Proventil Hfa 108 (90 Base) Mcg/act Aers (Albuterol sulfate) ..... Inhale 2 puff using inhaler every four hours  Orders: [facility]- Est Level  3 ([address]454)  Complete Medication List: 1)  Singulair 10 Mg Tabs (Montelukast sodium) .Marland Kitchen.. 1 tab once daily 2)  Advair Diskus 500-50 Mcg/dose Misc (Fluticasone-salmeterol) .Marland Kitchen.. 1 puff bid 3)  Proventil Hfa 108 (90 Base) Mcg/act Aers (Albuterol sulfate) .... Inhale 2 puff using inhaler every four hours 4)  Lisinopril 10 Mg Tabs (Lisinopril) .... Take 1 tab by mouth daily 5)  Hydrochlorothiazide 12.5 Mg Caps (Hydrochlorothiazide) .Marland Kitchen.. 1 by mouth daily for high blood  pressure 6)  Diltiazem Hcl Cr 90 Mg Xr12h-cap (Diltiazem hcl) .... 2 tabs by mouth bid 7)  Xyzal 5 Mg Tabs (Levocetirizine dihydrochloride) .... 2 tabs by mouth daily 8)  Coumadin 5 Mg Tabs (Warfarin sodium) .... Take as directed by coumadin clinic. Prescriptions: SINGULAIR 10 MG  TABS (MONTELUKAST SODIUM) 1 tab once daily  #30 x 11   Entered and Authorized by:   Ardeen Garland  MD   Signed by:   Ardeen Garland  MD on 05/16/2009   Method used:   Faxed to ...       [facility] (retail)       [address] [place], [place]  [address]       Ph: [phone]       Fax: [phone]   RxID:   [id number] SINGULAIR 10 MG  TABS  (MONTELUKAST SODIUM) 1 tab once daily  #30 x 11   Entered and Authorized by:   Ardeen Garland  MD   Signed by:   Ardeen Garland  MD on 05/16/2009   Method used:   Electronically to        [facility] [address] (retail)       [phone]       [place], [place]  [address]       Ph: [phone]       Fax: [phone]   RxID:   [id number] DILTIAZEM HCL CR 90 MG XR12H-CAP (DILTIAZEM HCL) 2 tabs by mouth BID  #120 x 6   Entered and Authorized by:   Ardeen Garland  MD   Signed by:   Ardeen Garland  MD on 05/16/2009   Method used:   Electronically to        [facility] [address] (retail)       [phone]       [place], [place]  [address]       Ph: [phone]       Fax: [phone]   RxID:   [id number]

## 2010-04-10 NOTE — Medication Information (Signed)
Summary: rov/tp  Anticoagulant Therapy  Managed by: Geoffry Paradise, PharmD Referring MD: Jens Som PCP: Ardeen Garland  MD Supervising MD: Shirlee Latch MD, Dalton Indication 1: Atrial Fibrillation (ICD-427.31) Lab Used: [facility] [place] Site: [address] INR RANGE 2 - 3  Dietary changes: no    Health status changes: no    Bleeding/hemorrhagic complications: no    Recent/future hospitalizations: no    Any changes in medication regimen? no    Recent/future dental: no  Any missed doses?: no       Is patient compliant with meds? yes       Allergies: 1)  ! Doxycycline  Anticoagulation Management History:      Positive risk factors for bleeding include an age of 70 years or older.  The bleeding index is 'intermediate risk'.  Positive CHADS2 values include History of HTN.  Negative CHADS2 values include Age > 35 years old.  The start date was 01/13/2008.  Her last INR was 5.2 ratio.  Anticoagulation responsible [name]: Shirlee Latch MD, Dalton.  Exp: 02/2011.    Anticoagulation Management Assessment/Plan:      The patient's current anticoagulation dose is Coumadin 5 mg tabs: Take as directed by coumadin clinic..  The target INR is 2.0-3.0.  The next INR is due 03/19/2010.  Anticoagulation instructions were given to patient.  Results were reviewed/authorized by Geoffry Paradise, PharmD.         Prior Anticoagulation Instructions: INR 3.8 Skip dose for tomorrow, then resume previous dose of 0.5 tablet everyday except 1 tablet on Tuesday and Saturday Recheck INR in 3 weeks  Current Anticoagulation Instructions: INR:  3.2  Your INR is a little high today.  Eat some extra greens in the next few days.  Continue your coumadin at 1 tablert on tuesdays and saturdays and 0.5 tablet on other days of the week.  Return to office in 3 weeks for another INR check.

## 2010-04-10 NOTE — Progress Notes (Signed)
Summary: Rx  Phone Note Refill Request Call back at Home Phone [phone]   Refills Requested: Medication #1:  LISINOPRIL 10 MG  TABS Take 1 tab by mouth daily Initial call taken by: Knox Royalty,  March 07, 2010 10:20 AM  Follow-up for Phone Call        Rx sent  Follow-up by: Theresia Lo RN,  March 07, 2010 11:53 AM

## 2010-04-10 NOTE — Medication Information (Signed)
Summary: rov/ewj  Anticoagulant Therapy  Managed by: Weston Brass, PharmD PCP: Ardeen Garland  MD Supervising MD: Clifton James MD, Cristal Deer Indication 1: Atrial Fibrillation (ICD-427.31) Lab Used: [facility] [place] Site: [address] INR POC 2.2 INR RANGE 2 - 3  Dietary changes: no    Health status changes: no    Bleeding/hemorrhagic complications: no    Recent/future hospitalizations: no    Any changes in medication regimen? no    Recent/future dental: no  Any missed doses?: no       Is patient compliant with meds? yes       Allergies: 1)  ! Doxycycline  Anticoagulation Management History:      The patient is taking warfarin and comes in today for a routine follow up visit.  Positive risk factors for bleeding include an age of 70 years or older.  The bleeding index is 'intermediate risk'.  Positive CHADS2 values include History of HTN.  Negative CHADS2 values include Age > 41 years old.  The start date was 01/13/2008.  Her last INR was 5.2 ratio.  Anticoagulation responsible provider: Clifton James MD, Cristal Deer.  INR POC: 2.2.  Cuvette Lot#: [id number].  Exp: 07/2010.    Anticoagulation Management Assessment/Plan:      The patient's current anticoagulation dose is Coumadin 5 mg tabs: Take as directed by coumadin clinic..  The target INR is 2.0-3.0.  The next INR is due 07/15/2009.  Anticoagulation instructions were given to patient.  Results were reviewed/authorized by Weston Brass, PharmD.  She was notified by Weston Brass PharmD.         Prior Anticoagulation Instructions: INR 2.0  Continue on same dosage 1/2 tablet daily except 1 tablet on Tuesdays and Saturdays.  Recheck in 4 weeks.    Current Anticoagulation Instructions: INR 2.2  Continue same dose of 1/2 tablet every day except 1 tablet on Tuesday and Saturday

## 2010-04-10 NOTE — Progress Notes (Signed)
Summary: meds prob  Phone Note Call from Patient Call back at Home Phone [phone]   Caller: Patient Summary of Call: [facility] Dept is having a hard time getting her Advair 500-50 in and it will be a while longer but they do have the Advair 250-50 samples and wants to know if it's OK to go ahead and give this to her until other come in.pls call Crystal at [facility] [phone] Initial call taken by: De Nurse,  January 09, 2010 3:56 PM  Follow-up for Phone Call        called back and left vm at telephone number to please go ahead and fill with the advair 250/50 dosage until the 500/50 comes in. Ellin Mayhew MD  January 09, 2010 5:00 PM      Appended Document: meds prob Crystal from [facility] calls about message that was left by Dr. Edmonia James yesterday evening regarding giving patient Adviar 250/50 until they can get in the 500/50.  she wanted to make sure the instructions are the same and RN advised yes same instructions. one puff twice daily.

## 2010-04-10 NOTE — Medication Information (Signed)
Summary: rov/sp  Anticoagulant Therapy  Managed by: Bethena Midget, RN, BSN Referring MD: Jens Som PCP: Ardeen Garland  MD Supervising MD: Antoine Poche MD, Fayrene Fearing Indication 1: Atrial Fibrillation (ICD-427.31) Lab Used: [facility] [place] Site: [address] INR POC 2.6 INR RANGE 2 - 3  Dietary changes: no    Health status changes: yes       Details: COPD- excerbation  Bleeding/hemorrhagic complications: no    Recent/future hospitalizations: no    Any changes in medication regimen? yes       Details: Starting Prednisone 20mg s 2 tabs daily for 10 days- starting today.   Recent/future dental: no  Any missed doses?: no       Is patient compliant with meds? yes       Allergies: 1)  ! Doxycycline  Anticoagulation Management History:      The patient is taking warfarin and comes in today for a routine follow up visit.  Positive risk factors for bleeding include an age of 70 years or older.  The bleeding index is 'intermediate risk'.  Positive CHADS2 values include History of HTN.  Negative CHADS2 values include Age > 57 years old.  The start date was 01/13/2008.  Her last INR was 5.2 ratio.  Anticoagulation responsible provider: Antoine Poche MD, Fayrene Fearing.  INR POC: 2.6.  Cuvette Lot#: [id number].  Exp: 10/2010.    Anticoagulation Management Assessment/Plan:      The patient's current anticoagulation dose is Coumadin 5 mg tabs: Take as directed by coumadin clinic..  The target INR is 2.0-3.0.  The next INR is due 08/16/2009.  Anticoagulation instructions were given to patient.  Results were reviewed/authorized by Bethena Midget, RN, BSN.  She was notified by Bethena Midget, RN, BSN.         Prior Anticoagulation Instructions: INR 2.4  Continue same dose of 1/2 tablet every day except 1 tablet on Tuesday and Saturday   Current Anticoagulation Instructions: INR 2.6 Continue 2.5mg s everyday except 5mg s on Tuesdays and Saturdays. Recheck in 4 days.

## 2010-04-10 NOTE — Miscellaneous (Signed)
Summary: dilt change  Clinical Lists Changes  Medications: Changed medication from DILTIAZEM HCL CR 90 MG XR12H-CAP (DILTIAZEM HCL) 2 tabs by mouth BID to DILTIAZEM HCL 90 MG TABS (DILTIAZEM HCL) 1 tab by mouth q 6 hrs - Signed Rx of DILTIAZEM HCL 90 MG TABS (DILTIAZEM HCL) 1 tab by mouth q 6 hrs;  #120 x 5;  Signed;  Entered by: Ardeen Garland  MD;  Authorized by: Ardeen Garland  MD;  Method used: Electronically to [facility] [phone] [place], [place], [place]  [address], Ph: [phone], Fax: [phone]  Pharmacist had called saying the patient can't afford the cost of the extended release capsules.  Prescriptions: DILTIAZEM HCL 90 MG TABS (DILTIAZEM HCL) 1 tab by mouth q 6 hrs  #120 x 5   Entered and Authorized by:   Ardeen Garland  MD   Signed by:   Ardeen Garland  MD on 08/15/2009   Method used:   Electronically to        [facility] [address] (retail)       [phone]       [place], [place]  [address]       Ph: [phone]       Fax: [phone]   RxID:   [id number]

## 2010-04-10 NOTE — Miscellaneous (Signed)
  Clinical Lists Changes  Problems: Removed problem of NEED PROPHYLACTIC VACCINATION&INOCULATION FLU (ICD-V04.81) Removed problem of ENCOUNTER FOR LONG-TERM USE OF OTHER MEDICATIONS (ICD-V58.69) Removed problem of INFLUENZA DUE TO ID NOVEL H1N1 INFLUENZA VIRUS (ICD-488.1)      Allergies: 1)  ! Doxycycline

## 2010-04-10 NOTE — Medication Information (Signed)
Summary: rov/cb  Anticoagulant Therapy  Managed by: Bethena Midget, RN, BSN Referring MD: Jens Som PCP: Ardeen Garland  MD Supervising MD: Gala Romney MD, Saidi Santacroce Indication 1: Atrial Fibrillation (ICD-427.31) Lab Used: [facility] [place] Site: [address] INR POC 2.9 INR RANGE 2 - 3  Dietary changes: no    Health status changes: yes       Details: COPD exacerbation  Bleeding/hemorrhagic complications: no    Recent/future hospitalizations: no    Any changes in medication regimen? yes       Details: Took Prednisone for 10 days. Completes regimen today but will get refill and resume for another 10 days  Recent/future dental: no  Any missed doses?: no       Is patient compliant with meds? yes       Allergies: 1)  ! Doxycycline  Anticoagulation Management History:      The patient is taking warfarin and comes in today for a routine follow up visit.  Positive risk factors for bleeding include an age of 70 years or older.  The bleeding index is 'intermediate risk'.  Positive CHADS2 values include History of HTN.  Negative CHADS2 values include Age > 81 years old.  The start date was 01/13/2008.  Her last INR was 5.2 ratio.  Anticoagulation responsible provider: Jamon Hayhurst MD, Reuel Boom.  INR POC: 2.9.  Cuvette Lot#: [id number].  Exp: 10/2010.    Anticoagulation Management Assessment/Plan:      The patient's current anticoagulation dose is Coumadin 5 mg tabs: Take as directed by coumadin clinic..  The target INR is 2.0-3.0.  The next INR is due 09/11/2009.  Anticoagulation instructions were given to patient.  Results were reviewed/authorized by Bethena Midget, RN, BSN.  She was notified by Bethena Midget, RN, BSN.         Prior Anticoagulation Instructions: INR 2.6. Take 0.5 tablet daily except 1 tablet on Tues and Sat. Recheck on 08/21/09.   Current Anticoagulation Instructions: INR 2.9 Continue 2.5mg s daily except 5mg s on Tuesdays and Saturdays. Recheck in 3 weeks.

## 2010-04-10 NOTE — Medication Information (Signed)
Summary: rov/tm  Anticoagulant Therapy  Managed by: Elaina Pattee, PharmD Referring MD: Jens Som PCP: Ardeen Garland  MD Supervising MD: Clifton James MD, Cristal Deer Indication 1: Atrial Fibrillation (ICD-427.31) Lab Used: [facility] [place] Site: [address] INR POC 2.6 INR RANGE 2 - 3  Dietary changes: no    Health status changes: no    Bleeding/hemorrhagic complications: no    Recent/future hospitalizations: no    Any changes in medication regimen? yes       Details: Prednisone will DC on Wednesday 08/21/09.  Recent/future dental: no  Any missed doses?: no       Is patient compliant with meds? yes       Allergies: 1)  ! Doxycycline  Anticoagulation Management History:      The patient is taking warfarin and comes in today for a routine follow up visit.  Positive risk factors for bleeding include an age of 70 years or older.  The bleeding index is 'intermediate risk'.  Positive CHADS2 values include History of HTN.  Negative CHADS2 values include Age > 98 years old.  The start date was 01/13/2008.  Her last INR was 5.2 ratio.  Anticoagulation responsible provider: Clifton James MD, Cristal Deer.  INR POC: 2.6.  Cuvette Lot#: [id number].  Exp: 10/2010.    Anticoagulation Management Assessment/Plan:      The patient's current anticoagulation dose is Coumadin 5 mg tabs: Take as directed by coumadin clinic..  The target INR is 2.0-3.0.  The next INR is due 08/21/2009.  Anticoagulation instructions were given to patient.  Results were reviewed/authorized by Elaina Pattee, PharmD.  She was notified by Elaina Pattee, PharmD.         Prior Anticoagulation Instructions: INR 2.6 Continue 2.5mg s everyday except 5mg s on Tuesdays and Saturdays. Recheck in 4 days.   Current Anticoagulation Instructions: INR 2.6. Take 0.5 tablet daily except 1 tablet on Tues and Sat. Recheck on 08/21/09.

## 2010-04-10 NOTE — Assessment & Plan Note (Signed)
Summary: breathing problem,tcb   Vital Signs:  Patient profile:   70 year old female Height:      63 inches Weight:      102 pounds BMI:     18.13 O2 Sat:      92 % on Room air Temp:     97.6 degrees F oral Pulse rate:   104 / minute BP sitting:   163 / 97  (left arm) Cuff size:   regular  Vitals Entered By: Tessie Fass CMA (August 12, 2009 10:12 AM)  O2 Flow:  Room air CC: dyspnea x 2 weeks Is Patient Diabetic? No Pain Assessment Patient in pain? no        Primary Care Provider:  Ardeen Garland  MD  CC:  dyspnea x 2 weeks.  History of Present Illness:  SOB for greater than 2 weeks. Worse with exertion, has been using Albuterol approx 3-4x a day for rescue treatments, taking Advair and singular as prescribed. Sputum has increased and she feels more congested, cough is worse and occasionally productive of white sputum but difficult to bring it up. Denies, fever, chest pain, N/V. Still smoking 1ppd but states a little less these past couple of days.  Habits & Providers  Alcohol-Tobacco-Diet     Tobacco Status: current     Tobacco Counseling: to quit use of tobacco products     Cigarette Packs/Day: 1.0  Current Medications (verified): 1)  Singulair 10 Mg  Tabs (Montelukast Sodium) .Marland Kitchen.. 1 Tab Once Daily 2)  Advair Diskus 500-50 Mcg/dose  Misc (Fluticasone-Salmeterol) .Marland Kitchen.. 1 Puff Bid 3)  Proventil Hfa 108 (90 Base) Mcg/act Aers (Albuterol Sulfate) .... Inhale 2 Puff Using Inhaler Every Four Hours 4)  Lisinopril 10 Mg  Tabs (Lisinopril) .... Take 1 Tab By Mouth Daily 5)  Hydrochlorothiazide 12.5 Mg Caps (Hydrochlorothiazide) .Marland Kitchen.. 1 By Mouth Daily For High Blood Pressure 6)  Diltiazem Hcl Cr 90 Mg Xr12h-Cap (Diltiazem Hcl) .... 2 Tabs By Mouth Bid 7)  Xyzal 5 Mg Tabs (Levocetirizine Dihydrochloride) .... 2 Tabs By Mouth Daily 8)  Coumadin 5 Mg Tabs (Warfarin Sodium) .... Take As Directed By Coumadin Clinic. 9)  Prednisone 20 Mg Tabs (Prednisone) .... Take 2 Tabs By Mouth  Daily X 10 Days Qs  Allergies (verified): 1)  ! 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   Neck:  supple.   Lungs:  slightly increased WOB able to speak in full sentences Oxygen sat 92% on RA increased end expiratory flow, with bilateral inspiratory and expiratory wheeze, decreased air movment at bases  s/p Neb ( ALbuterol/Atrovent)- improved air movement, still decreased at bases bilat, improved bronchospasm, pt states she feels a lot better  Heart:  irregular rhythem, mild tachycarida HR 100 Pulses:  2+ bilat, no cyanosis   Impression & Recommendations:  Problem # 1:  COPD (ICD-496) Assessment Deteriorated Will treat for COPD exacerbation, non produtive sputum with little change therefore will hold on antibiotics and  treat with prolonged steroid course which pt has used in the past. Neb given in house. Given red flags to return Her updated medication list for this problem includes:    Singulair 10 Mg Tabs (Montelukast sodium) .Marland Kitchen... 1 tab once daily    Advair Diskus 500-50 Mcg/dose Misc (Fluticasone-salmeterol) .Marland Kitchen... 1 puff bid    Proventil Hfa 108 (90 Base) Mcg/act Aers (Albuterol sulfate) .....  Inhale 2 puff using inhaler every four hours  Orders: Pulse Oximetry- [facility] (94760) Albuterol Sulfate Sol 1mg  unit dose (O1308) Atrovent 1mg  (Neb) (J7644) [facility]- Est Level  3 (65784)  Complete Medication List: 1)  Singulair 10 Mg Tabs (Montelukast sodium) .Marland Kitchen.. 1 tab once daily 2)  Advair Diskus 500-50 Mcg/dose Misc (Fluticasone-salmeterol) .Marland Kitchen.. 1 puff bid 3)  Proventil Hfa 108 (90 Base) Mcg/act Aers (Albuterol sulfate) .... Inhale 2 puff using inhaler every four hours 4)  Lisinopril 10 Mg Tabs (Lisinopril) .... Take 1 tab by mouth daily 5)  Hydrochlorothiazide 12.5 Mg Caps (Hydrochlorothiazide) .Marland Kitchen.. 1 by mouth daily for high blood pressure 6)  Diltiazem Hcl Cr 90 Mg Xr12h-cap (Diltiazem hcl) .... 2 tabs by mouth bid 7)  Xyzal 5 Mg Tabs (Levocetirizine dihydrochloride) .... 2 tabs by mouth daily 8)  Coumadin 5 Mg Tabs (Warfarin sodium) .... Take as directed by coumadin clinic. 9)  Prednisone 20 Mg Tabs (Prednisone) .... Take 2 tabs by mouth daily x 10 days qs  Patient Instructions: 1)  It was very nice to meet you today! 2)  I am treating you for a COPD exacerbation, Please start the prednisone as directed for 10 days. 3)  If your mucous starts to come up and it turns colors- yellow/green please let me know as you may need additional antibiotics 4)  Use your inhaler every 4 hours for the next 2 days, then as needed 5)  Return if your breathing is not any better or go to nearest ER  6)  You can try Mucinex - 1 tab twice a day, with a full glass of water Prescriptions: PREDNISONE 20 MG TABS (PREDNISONE) Take 2 tabs by mouth daily x 10 days QS  #20 x 1   Entered and Authorized by:   [name] MD   Signed by:   [name] MD on 08/12/2009   Method used:   Electronically to        [facility] [address] (retail)       [phone]       [place], [place]  [address]       Ph: [phone]       Fax: [phone]   RxID:    [id number]    Medication Administration  Medication # 1:    Medication: Albuterol Sulfate Sol 1mg  unit dose    Diagnosis: COPD (ICD-496)    Dose: 2.5mg     Route: inhaled    Exp Date: 02/2011    Lot #: [id number]    Mfr: nephron    Comments: 2.5mg /50ml given    Patient tolerated medication without complications    Given by: Tessie Fass CMA (August 12, 2009 11:26 AM)  Medication # 2:    Medication: Atrovent 1mg  (Neb)    Diagnosis: COPD (ICD-496)    Dose: 0.5mg     Route: inhaled    Exp Date: 10/2010    Lot #: [id number]    Mfr: nephron    Comments: 0.5mg /2.16ml given    Patient tolerated medication without complications  Given by: Tessie Fass CMA (August 12, 2009 11:27 AM)  Orders Added: 1)  Pulse Oximetry- [facility] [94760] 2)  Albuterol Sulfate Sol 1mg  unit dose [J7613] 3)  Atrovent 1mg  (Neb) [B2841] 4)  [facility]- Est Level  3 [32440]

## 2010-04-10 NOTE — Progress Notes (Signed)
  Phone Note Refill Request Call back at Home Phone [phone] Message from:  Patient  Refills Requested: Medication #1:  HYDROCHLOROTHIAZIDE 12.5 MG CAPS 1 by mouth daily for high blood pressure Initial call taken by: De Nurse,  February 14, 2010 2:40 PM  Follow-up for Phone Call        Scheduled [name] for 1/5 appt to meet with new  doc and med refills. Follow-up by: Abundio Miu,  February 17, 2010 10:28 AM    Prescriptions: HYDROCHLOROTHIAZIDE 12.5 MG CAPS (HYDROCHLOROTHIAZIDE) 1 by mouth daily for high blood pressure  #30 x 3   Entered and Authorized by:   Ellin Mayhew MD   Signed by:   Ellin Mayhew MD on 02/17/2010   Method used:   Electronically to        [facility] [address] (retail)       [phone]       [place], [place]  [address]       Ph: [phone]       Fax: [phone]   RxID:   [id number]  Rx refilled to et her through to her f/up appt.  Will ask staff to call and notify pt that I have refilled the bp med as requested.  Ellin Mayhew MD  February 17, 2010 1:33 PM

## 2010-04-10 NOTE — Medication Information (Signed)
Summary: rov/sp  Anticoagulant Therapy  Managed by: Weston Brass, PharmD Referring MD: Jens Som PCP: Ardeen Garland  MD Supervising MD: Gala Romney MD, Reuel Boom Indication 1: Atrial Fibrillation (ICD-427.31) Lab Used: [facility] [place] Site: [address] INR POC 3.8 INR RANGE 2 - 3  Dietary changes: yes       Details: Had an extra glass of wine last night  Health status changes: no    Bleeding/hemorrhagic complications: no    Recent/future hospitalizations: no    Any changes in medication regimen? no    Recent/future dental: no  Any missed doses?: no       Is patient compliant with meds? yes       Allergies: 1)  ! Doxycycline  Anticoagulation Management History:      The patient is taking warfarin and comes in today for a routine follow up visit.  Positive risk factors for bleeding include an age of 32 years or older.  The bleeding index is 'intermediate risk'.  Positive CHADS2 values include History of HTN.  Negative CHADS2 values include Age > 16 years old.  The start date was 01/13/2008.  Her last INR was 5.2 ratio.  Anticoagulation responsible provider: Bensimhon MD, Reuel Boom.  INR POC: 3.8.  Cuvette Lot#: [id number].  Exp: 02/2011.    Anticoagulation Management Assessment/Plan:      The patient's current anticoagulation dose is Coumadin 5 mg tabs: Take as directed by coumadin clinic..  The target INR is 2.0-3.0.  The next INR is due 02/26/2010.  Anticoagulation instructions were given to patient.  Results were reviewed/authorized by Weston Brass, PharmD.  She was notified by Hoy Register, PharmD [place].         Prior Anticoagulation Instructions: INR 2.2  Continue same dose of 1/2 tablet every day except 1 tablet on Tuesday and Saturday. Recheck INR in 4 weeks.   Current Anticoagulation Instructions: INR 3.8 Skip dose for tomorrow, then resume previous dose of 0.5 tablet everyday except 1 tablet on Tuesday and Saturday Recheck INR in 3 weeks

## 2010-04-10 NOTE — Medication Information (Signed)
Summary: rov/tm  Anticoagulant Therapy  Managed by: Bethena Midget, RN, BSN Referring MD: Jens Som PCP: Ardeen Garland  MD Supervising MD: Eden Emms MD, Peter Indication 1: Atrial Fibrillation (ICD-427.31) Lab Used: [facility] [place] Site: [address] INR POC 2.7 INR RANGE 2 - 3  Dietary changes: no    Health status changes: no    Bleeding/hemorrhagic complications: no    Recent/future hospitalizations: no    Any changes in medication regimen? no    Recent/future dental: no  Any missed doses?: no       Is patient compliant with meds? yes       Allergies: 1)  ! Doxycycline  Anticoagulation Management History:      Positive risk factors for bleeding include an age of 5 years or older.  The bleeding index is 'intermediate risk'.  Positive CHADS2 values include History of HTN.  Negative CHADS2 values include Age > 65 years old.  The start date was 01/13/2008.  Her last INR was 5.2 ratio.  Anticoagulation responsible Ralphie Lovelady: Eden Emms MD, Theron Arista.  INR POC: 2.7.  Cuvette Lot#: [id number].  Exp: 12/2010.    Anticoagulation Management Assessment/Plan:      The patient's current anticoagulation dose is Coumadin 5 mg tabs: Take as directed by coumadin clinic..  The target INR is 2.0-3.0.  The next INR is due 12/11/2009.  Anticoagulation instructions were given to patient.  Results were reviewed/authorized by Bethena Midget, RN, BSN.  She was notified by Kennieth Francois.         Prior Anticoagulation Instructions: INR 2.2 Continue 2.5mg s everyday except 5mg s on Tuesdays and Saturdays. Recheck in 4 weeks.   Current Anticoagulation Instructions: INR 2.7  Continue taking one-half tablet every day except one tablet on Tuesday and Saturday.  We will see you in four weeks.

## 2010-04-10 NOTE — Medication Information (Signed)
Summary: ROV  Anticoagulant Therapy  Managed by: Bethena Midget, RN, BSN Referring MD: Jens Som PCP: Ardeen Garland  MD Supervising MD: Johney Frame MD, Fayrene Fearing Indication 1: Atrial Fibrillation (ICD-427.31) Lab Used: [facility] [place] Site: [address] INR POC 2.4 INR RANGE 2 - 3  Dietary changes: no    Health status changes: no    Bleeding/hemorrhagic complications: no    Recent/future hospitalizations: no    Any changes in medication regimen? no    Recent/future dental: no  Any missed doses?: no       Is patient compliant with meds? yes       Allergies: 1)  ! Doxycycline  Anticoagulation Management History:      The patient is taking warfarin and comes in today for a routine follow up visit.  Positive risk factors for bleeding include an age of 70 years or older.  The bleeding index is 'intermediate risk'.  Positive CHADS2 values include History of HTN.  Negative CHADS2 values include Age > 40 years old.  The start date was 01/13/2008.  Her last INR was 5.2 ratio.  Anticoagulation responsible provider: Chelcee Korpi MD, Fayrene Fearing.  INR POC: 2.4.  Cuvette Lot#: [id number].  Exp: 04/2011.    Anticoagulation Management Assessment/Plan:      The patient's current anticoagulation dose is Coumadin 5 mg tabs: Take as directed by coumadin clinic..  The target INR is 2.0-3.0.  The next INR is due 04/16/2010.  Anticoagulation instructions were given to patient.  Results were reviewed/authorized by Bethena Midget, RN, BSN.  She was notified by Bethena Midget, RN, BSN.         Prior Anticoagulation Instructions: INR:  3.2  Your INR is a little high today.  Eat some extra greens in the next few days.  Continue your coumadin at 1 tablert on tuesdays and saturdays and 0.5 tablet on other days of the week.  Return to office in 3 weeks for another INR check.    Current Anticoagulation Instructions: INR 2.4 Continue 1/2 pill everyday except 1 pill on Tuesdays and Saturdays. Recheck in 4 weeks.

## 2010-04-10 NOTE — Letter (Signed)
Summary: Generic Letter  [facility]  [address]   [place], [place] [address]   Phone: [phone]  Fax: [phone]    05/16/2009  Jenet Fujimoto [address] [place], [place]  [address]  Dear Ms. Turano,  Your last colonoscopy was in December of 2001.  That means you will be due for a repeat colonscopy in December of this year.  It can be scheduled at that time.  If you have any questions, please give [place] a call.          Sincerely,   Ardeen Garland  MD  Appended Document: Generic Letter pt letter mailed

## 2010-04-10 NOTE — Progress Notes (Signed)
Summary: refill  Phone Note Refill Request Call back at Home Phone [phone] Message from:  Patient  Refills Requested: Medication #1:  DILTIAZEM HCL CR 90 MG XR12H-CAP 2 tabs by mouth BID Initial call taken by: De Nurse,  August 13, 2009 11:29 AM  Follow-up for Phone Call        sent rx.  Follow-up by: Lamar Laundry, MD June 7th, 2011 11:30AM

## 2010-04-10 NOTE — Medication Information (Signed)
Summary: rov/tm  Anticoagulant Therapy  Managed by: Cloyde Reams, RN, BSN PCP: Ardeen Garland  MD Supervising MD: Tenny Craw MD, Gunnar Fusi Indication 1: Atrial Fibrillation (ICD-427.31) Lab Used: [facility] [place] Site: [address] INR POC 2.0 INR RANGE 2 - 3  Dietary changes: no    Health status changes: no    Bleeding/hemorrhagic complications: no    Recent/future hospitalizations: no    Any changes in medication regimen? no    Recent/future dental: no  Any missed doses?: no       Is patient compliant with meds? yes       Allergies: 1)  ! Doxycycline  Anticoagulation Management History:      The patient is taking warfarin and comes in today for a routine follow up visit.  Positive risk factors for bleeding include an age of 71 years or older.  The bleeding index is 'intermediate risk'.  Positive CHADS2 values include History of HTN.  Negative CHADS2 values include Age > 104 years old.  The start date was 01/13/2008.  Her last INR was 5.2 ratio.  Anticoagulation responsible provider: Tenny Craw MD, Gunnar Fusi.  INR POC: 2.0.  Cuvette Lot#: [id number].  Exp: 07/2010.    Anticoagulation Management Assessment/Plan:      The patient's current anticoagulation dose is Coumadin 5 mg tabs: Take as directed by coumadin clinic..  The target INR is 2.0-3.0.  The next INR is due 06/17/2009.  Anticoagulation instructions were given to patient.  Results were reviewed/authorized by Cloyde Reams, RN, BSN.  She was notified by Cloyde Reams RN.         Prior Anticoagulation Instructions: INR 2.5  Continue 0.5 tab daily except 1 tab Tuesdays and Saturdays.  Recheck in 4 weeks.  The patient is to hold four doses of coumadin.  The dosage to be resumed includes:   Current Anticoagulation Instructions: INR 2.0  Continue on same dosage 1/2 tablet daily except 1 tablet on Tuesdays and Saturdays.  Recheck in 4 weeks.

## 2010-04-10 NOTE — Assessment & Plan Note (Signed)
Summary: meet new doc/meds update/bmc   Vital Signs:  Patient profile:   70 year old female Height:      63 inches Weight:      104 pounds BMI:     18.49 Pulse rate:   92 / minute BP sitting:   138 / 78  (right arm)  Vitals Entered By: Arlyss Repress CMA, (March 13, 2010 1:43 PM) CC: meet new PCP. refill meds. Is Patient Diabetic? No Pain Assessment Patient in pain? no        Primary Care Provider:  Ardeen Garland  MD  CC:  meet new PCP. refill meds..  History of Present Illness: cold symptoms: runny nose x 2 weeks.  worse cough x 2 weeks.  throat irritation from drainage.  no sore throat. dry cough, no productive cough, no fever.  no n/v/d.  smoking: continues to smoke 1ppd, has tried to quit in the past but reports to much irritatiability.  copd: takes all meds as prescribed.  denies recent exacerbation. no increased sob. no cp. no increase in wheezing.  no increased mucous production.  htn: pt states that her bp has been well-controlled.  no blurry vision, no h/a.  no dizzness/syncope.   Habits & Providers  Alcohol-Tobacco-Diet     Tobacco Status: current     Tobacco Counseling: to quit use of tobacco products     Cigarette Packs/Day: 1.0  Comments: tried several times to quitt smoking. is interested in trying again  Current Medications (verified): 1)  Advair Diskus 500-50 Mcg/dose  Misc (Fluticasone-Salmeterol) .Marland Kitchen.. 1 Puff Bid 2)  Proventil Hfa 108 (90 Base) Mcg/act Aers (Albuterol Sulfate) .... Inhale 2 Puff Using Inhaler Every Four Hours 3)  Lisinopril 10 Mg  Tabs (Lisinopril) .... Take 1 Tab By Mouth Daily 4)  Hydrochlorothiazide 12.5 Mg Caps (Hydrochlorothiazide) .Marland Kitchen.. 1 By Mouth Daily For High Blood Pressure 5)  Diltiazem Hcl 90 Mg Tabs (Diltiazem Hcl) .... Take 1 Tablet By Mouth 4 X Day 6)  Coumadin 5 Mg Tabs (Warfarin Sodium) .... Take As Directed By Coumadin Clinic.  Allergies (verified): 1)  ! Doxycycline  Review of Systems       as per  hpi  Physical Exam  General:  VSS Well-developed,well-nourished,in no acute distress; alert,appropriate and cooperative throughout examination Ears:  External ear exam shows no significant lesions or deformities.  Otoscopic examination reveals clear canals, tympanic membranes are intact bilaterally  Mouth:  Oral mucosa and oropharynx without lesions or exudates.  Teeth in good repair. Lungs:  Normal respiratory effort, chest expands symmetrically. Lungs are clear to auscultation, no crackles or wheezes. Heart:  Normal rate and regular rhythm. S1 and S2 normal without gallop, murmur, click, rub or other extra sounds. Extremities:  no edema Psych:  Cognition and judgment appear intact. Alert and cooperative with normal attention span and concentration. No apparent delusions, illusions, hallucinations   Impression & Recommendations:  Problem # 1:  COPD (ICD-496) Assessment Unchanged had only 1 copd exacerbation during past year.  Appears to be in good control.  Good compliance with advair.  If pt begins to have more frequent exacerbations will consider addtion of spiriva.   advised pt to stop smoking.   refilled meds.  stopped singular- pt said it was not helping and she no longer wanted to take this.  The following medications were removed from the medication list:    Singulair 10 Mg Tabs (Montelukast sodium) .Marland Kitchen... 1 tab once daily Her updated medication list for this problem includes:  Advair Diskus 500-50 Mcg/dose Misc (Fluticasone-salmeterol) .Marland Kitchen... 1 puff bid    Proventil Hfa 108 (90 Base) Mcg/act Aers (Albuterol sulfate) ..... Inhale 2 puff using inhaler every four hours  Orders: [facility]- Est  Level 4 ([address])  Problem # 2:  HYPERTENSION, BENIGN SYSTEMIC (ICD-401.1) Assessment: Unchanged will continue current regimen.   Her updated medication list for this problem includes:    Lisinopril 10 Mg Tabs (Lisinopril) .Marland Kitchen... Take 1 tab by mouth daily    Hydrochlorothiazide 12.5 Mg Caps  (Hydrochlorothiazide) .Marland Kitchen... 1 by mouth daily for high blood pressure    Diltiazem Hcl 90 Mg Tabs (Diltiazem hcl) .Marland Kitchen... Take 1 tablet by mouth 4 x day  Orders: [facility]- Est  Level 4 ([address])  Problem # 3:  TOBACCO DEPENDENCE (ICD-305.1) continues to smoke 1 ppd.  Gave information about meeting one-on-one with Dr. Pascal Lux and Dr. Raymondo Band to get a plan to quit.  Pt states that she will think about it.  Orders: [facility]- Est  Level 4 ([address])  Problem # 4:  prevention: pt wants to differ all prevention screenings to later in the year when she will have better insurance coverage.   Complete Medication List: 1)  Advair Diskus 500-50 Mcg/dose Misc (Fluticasone-salmeterol) .Marland Kitchen.. 1 puff bid 2)  Proventil Hfa 108 (90 Base) Mcg/act Aers (Albuterol sulfate) .... Inhale 2 puff using inhaler every four hours 3)  Lisinopril 10 Mg Tabs (Lisinopril) .... Take 1 tab by mouth daily 4)  Hydrochlorothiazide 12.5 Mg Caps (Hydrochlorothiazide) .Marland Kitchen.. 1 by mouth daily for high blood pressure 5)  Diltiazem Hcl 90 Mg Tabs (Diltiazem hcl) .... Take 1 tablet by mouth 4 x day 6)  Coumadin 5 Mg Tabs (Warfarin sodium) .... Take as directed by coumadin clinic.  Patient Instructions: 1)  return as needed.  2)  return in 6 month for copd follow up. Prescriptions: DILTIAZEM HCL 90 MG TABS (DILTIAZEM HCL) take 1 tablet by mouth 4 x day  #120 x 3   Entered and Authorized by:   Ellin Mayhew MD   Signed by:   Ellin Mayhew MD on 03/13/2010   Method used:   Electronically to        [facility] [address] (retail)       [phone]       [place], [place]  [address]       Ph: [phone]       Fax: [phone]   RxID:   [id number] HYDROCHLOROTHIAZIDE 12.5 MG CAPS (HYDROCHLOROTHIAZIDE) 1 by mouth daily for high blood pressure  #30 x 6   Entered and Authorized by:   Ellin Mayhew MD   Signed by:   Ellin Mayhew MD on 03/13/2010   Method used:   Electronically to        [facility]  [address] (retail)       [phone]       [place], [place]  [address]       Ph: [phone]       Fax: [phone]   RxID:   [id number] LISINOPRIL 10 MG  TABS (LISINOPRIL) Take 1 tab by mouth daily  #30 x 6   Entered and Authorized by:   Ellin Mayhew MD   Signed by:   Ellin Mayhew MD on 03/13/2010   Method used:   Electronically to        [facility] [address] (retail)  [address]       [place], [place]  [address]       Ph: [phone]       Fax: [phone]   RxID:   [id number]    Orders Added: 1)  [facility]- Est  Level 4 [[address]]     Prevention & Chronic Care Immunizations   Influenza vaccine: Fluvax MCR  (01/28/2010)   Influenza vaccine due: 11/27/2008    Tetanus booster: 09/06/2001: Done.   Tetanus booster due: 09/07/2011    Pneumococcal vaccine: Done.  (09/06/2001)   Pneumococcal vaccine due: None    H. zoster vaccine: Not documented   H. zoster vaccine deferral: Deferred  (03/13/2010)  Colorectal Screening   Hemoccult: Not documented    Colonoscopy: Done.  (12/08/1999)   Colonoscopy due: 02/10/2010  Other Screening   Pap smear: normal  (06/13/2005)   Pap smear due: 06/14/2006    Mammogram: ASSESSMENT: Negative - BI-RADS 1^MM DIGITAL SCREENING  (01/02/2009)   Mammogram action/deferral: Screening mammogram in 1 year.     (08/18/2006)   Mammogram due: 09/04/2008    DXA bone density scan: Done.  (06/07/2005)   DXA scan due: None    Smoking status: current  (03/13/2010)   Smoking cessation counseling: yes  (12/20/2007)  Lipids   Total Cholesterol: 241  (09/06/2006)   LDL: 131  (09/06/2006)   LDL Direct: Not documented   HDL: 85  (09/06/2006)   Triglycerides: 123  (09/06/2006)  Hypertension   Last Blood Pressure: 138 / 78  (03/13/2010)   Serum creatinine: 0.98  (08/21/2009)   Serum potassium 5.2  (08/21/2009)  Self-Management Support :    Hypertension  self-management support: Not documented

## 2010-04-10 NOTE — Assessment & Plan Note (Signed)
Summary: flu shot,df  Nurse Visit   Vital Signs:  Patient profile:   70 year old female Temp:     98.6 degrees F  Vitals Entered By: Theresia Lo RN (January 28, 2010 1:57 PM)  Allergies: 1)  ! Doxycycline  Immunizations Administered:  Influenza Vaccine # 1:    Vaccine Type: Fluvax MCR    Site: left deltoid    Mfr: [employer]Smith[employer]    Dose: 0.5 ml    Route: IM    Given by: Theresia Lo RN    Exp. Date: 09/06/2010    Lot #: [id number]    VIS given: 10/01/09 version given January 28, 2010.  Flu Vaccine Consent Questions:    Do you have a history of severe allergic reactions to this vaccine? no    Any prior history of allergic reactions to egg and/or gelatin? no    Do you have a sensitivity to the preservative Thimersol? no    Do you have a past history of Guillan-Barre Syndrome? no    Do you currently have an acute febrile illness? no    Have you ever had a severe reaction to latex? no    Vaccine information given and explained to patient? yes    Are you currently pregnant? no  Orders Added: 1)  Influenza Vaccine MCR [00025] 2)  Administration Flu vaccine - MCR [G0008]       Vital Signs:  Patient profile:   70 year old female Temp:     98.6 degrees F  Vitals Entered By: Theresia Lo RN (January 28, 2010 1:57 PM)

## 2010-04-10 NOTE — Medication Information (Signed)
Summary: rov/tm  Anticoagulant Therapy  Managed by: Cloyde Reams, RN, BSN Referring MD: Jens Som PCP: Ardeen Garland  MD Supervising MD: Johney Frame MD, Fayrene Fearing Indication 1: Atrial Fibrillation (ICD-427.31) Lab Used: [facility] [place] Site: [address] INR POC 2.8 INR RANGE 2 - 3  Dietary changes: no    Health status changes: no    Bleeding/hemorrhagic complications: no    Recent/future hospitalizations: no    Any changes in medication regimen? no    Recent/future dental: no  Any missed doses?: no       Is patient compliant with meds? yes       Allergies: 1)  ! Doxycycline  Anticoagulation Management History:      The patient is taking warfarin and comes in today for a routine follow up visit.  Positive risk factors for bleeding include an age of 70 years or older.  The bleeding index is 'intermediate risk'.  Positive CHADS2 values include History of HTN.  Negative CHADS2 values include Age > 27 years old.  The start date was 01/13/2008.  Her last INR was 5.2 ratio.  Anticoagulation responsible provider: Macallan Ord MD, Fayrene Fearing.  INR POC: 2.8.  Cuvette Lot#: [id number].  Exp: 11/2010.    Anticoagulation Management Assessment/Plan:      The patient's current anticoagulation dose is Coumadin 5 mg tabs: Take as directed by coumadin clinic..  The target INR is 2.0-3.0.  The next INR is due 10/09/2009.  Anticoagulation instructions were given to patient.  Results were reviewed/authorized by Cloyde Reams, RN, BSN.  She was notified by Cloyde Reams, RN, BSN.         Prior Anticoagulation Instructions: INR 2.9 Continue 2.5mg s daily except 5mg s on Tuesdays and Saturdays. Recheck in 3 weeks.   Current Anticoagulation Instructions: INR 2.8  Continue on same dosage 2.5mg  daily except 5mg  on Tuesdays and Saturdays.  Recheck in 4 weeks.

## 2010-04-10 NOTE — Medication Information (Signed)
Summary: rov/cs  Anticoagulant Therapy  Managed by: Weston Brass, PharmD Referring MD: Jens Som PCP: Ardeen Garland  MD Supervising MD: Clifton James MD, Cristal Deer Indication 1: Atrial Fibrillation (ICD-427.31) Lab Used: [facility]  Site: [address] INR POC 2.0 INR RANGE 2 - 3  Dietary changes: no    Health status changes: no    Bleeding/hemorrhagic complications: no    Recent/future hospitalizations: no    Any changes in medication regimen? no    Recent/future dental: no  Any missed doses?: no       Is patient compliant with meds? yes       Current Medications (verified): 1)  Singulair 10 Mg  Tabs (Montelukast Sodium) .Marland Kitchen.. 1 Tab Once Daily 2)  Advair Diskus 500-50 Mcg/dose  Misc (Fluticasone-Salmeterol) .Marland Kitchen.. 1 Puff Bid 3)  Proventil Hfa 108 (90 Base) Mcg/act Aers (Albuterol Sulfate) .... Inhale 2 Puff Using Inhaler Every Four Hours 4)  Lisinopril 10 Mg  Tabs (Lisinopril) .... Take 1 Tab By Mouth Daily 5)  Hydrochlorothiazide 12.5 Mg Caps (Hydrochlorothiazide) .Marland Kitchen.. 1 By Mouth Daily For High Blood Pressure 6)  Diltiazem Hcl 90 Mg Tabs (Diltiazem Hcl) .... Take 2 Tablets Two Times A Day 7)  Xyzal 5 Mg Tabs (Levocetirizine Dihydrochloride) .... 2 Tabs By Mouth Daily 8)  Coumadin 5 Mg Tabs (Warfarin Sodium) .... Take As Directed By Coumadin Clinic. 9)  Prednisone 20 Mg Tabs (Prednisone) .... Take 2 Tabs By Mouth Daily X 10 Days Qs. Last Dose Today  Allergies: 1)  ! Doxycycline  Anticoagulation Management History:      The patient is taking warfarin and comes in today for a routine follow up visit.  Positive risk factors for bleeding include an age of 70 years or older.  The bleeding index is 'intermediate risk'.  Positive CHADS2 values include History of HTN.  Negative CHADS2 values include Age > 47 years old.  The start date was 01/13/2008.  Her last INR was 5.2 ratio.  Anticoagulation responsible provider: Clifton James MD, Cristal Deer.  INR POC: 2.0.  Cuvette  Lot#: [id number].  Exp: 01/2011.    Anticoagulation Management Assessment/Plan:      The patient's current anticoagulation dose is Coumadin 5 mg tabs: Take as directed by coumadin clinic..  The target INR is 2.0-3.0.  The next INR is due 01/08/2010.  Anticoagulation instructions were given to patient.  Results were reviewed/authorized by Weston Brass, PharmD.  She was notified by Haynes Hoehn, PharmD Candidate.         Prior Anticoagulation Instructions: INR 2.7  Continue taking one-half tablet every day except one tablet on Tuesday and Saturday.  We will see you in four weeks.   Current Anticoagulation Instructions: INR 2.0   Continue Coumadin as scheduled:  1/2 tablet every day except 1 tablet on Tuesday and Saturday.  Return to clinic in 4 weeks.

## 2010-04-10 NOTE — Medication Information (Signed)
Summary: rov/sp  Anticoagulant Therapy  Managed by: Weston Brass, PharmD Referring MD: Jens Som PCP: Ardeen Garland  MD Supervising MD: Riley Kill MD, Maisie Fus Indication 1: Atrial Fibrillation (ICD-427.31) Lab Used: [facility] [place] Site: [address] INR POC 2.4 INR RANGE 2 - 3  Dietary changes: no    Health status changes: no    Bleeding/hemorrhagic complications: no    Recent/future hospitalizations: no    Any changes in medication regimen? no    Recent/future dental: no  Any missed doses?: no       Is patient compliant with meds? yes       Allergies: 1)  ! Doxycycline  Anticoagulation Management History:      The patient is taking warfarin and comes in today for a routine follow up visit.  Positive risk factors for bleeding include an age of 70 years or older.  The bleeding index is 'intermediate risk'.  Positive CHADS2 values include History of HTN.  Negative CHADS2 values include Age > 56 years old.  The start date was 01/13/2008.  Her last INR was 5.2 ratio.  Anticoagulation responsible provider: Riley Kill MD, Maisie Fus.  INR POC: 2.4.  Cuvette Lot#: [id number].  Exp: 08/2010.    Anticoagulation Management Assessment/Plan:      The patient's current anticoagulation dose is Coumadin 5 mg tabs: Take as directed by coumadin clinic..  The target INR is 2.0-3.0.  The next INR is due 08/12/2009.  Anticoagulation instructions were given to patient.  Results were reviewed/authorized by Weston Brass, PharmD.  She was notified by Weston Brass PharmD.         Prior Anticoagulation Instructions: INR 2.2  Continue same dose of 1/2 tablet every day except 1 tablet on Tuesday and Saturday   Current Anticoagulation Instructions: INR 2.4  Continue same dose of 1/2 tablet every day except 1 tablet on Tuesday and Saturday  Prescriptions: COUMADIN 5 MG TABS (WARFARIN SODIUM) Take as directed by coumadin clinic.  #90 x 1   Entered by:   Weston Brass PharmD   Authorized by:   Ferman Hamming, MD, [facility]   Signed by:   Weston Brass PharmD on 07/15/2009   Method used:   Electronically to        [facility] [address] (retail)       [phone]       [place], [place]  [address]       Ph: [phone]       Fax: [phone]   RxID:   [id number]

## 2010-04-10 NOTE — Medication Information (Signed)
Summary: rov/cs  Anticoagulant Therapy  Managed by: Weston Brass, PharmD Referring MD: Jens Som PCP: Ardeen Garland  MD Supervising MD: Riley Kill MD, Maisie Fus Indication 1: Atrial Fibrillation (ICD-427.31) Lab Used: [facility] [place] Site: [address] INR POC 2.2 INR RANGE 2 - 3  Dietary changes: no    Health status changes: no    Bleeding/hemorrhagic complications: no    Recent/future hospitalizations: no    Any changes in medication regimen? no    Recent/future dental: no  Any missed doses?: no       Is patient compliant with meds? yes       Allergies: 1)  ! Doxycycline  Anticoagulation Management History:      The patient is taking warfarin and comes in today for a routine follow up visit.  Positive risk factors for bleeding include an age of 8 years or older.  The bleeding index is 'intermediate risk'.  Positive CHADS2 values include History of HTN.  Negative CHADS2 values include Age > 63 years old.  The start date was 01/13/2008.  Her last INR was 5.2 ratio.  Anticoagulation responsible provider: Riley Kill MD, Maisie Fus.  INR POC: 2.2.  Cuvette Lot#: [id number].  Exp: 02/2011.    Anticoagulation Management Assessment/Plan:      The patient's current anticoagulation dose is Coumadin 5 mg tabs: Take as directed by coumadin clinic..  The target INR is 2.0-3.0.  The next INR is due 02/05/2010.  Anticoagulation instructions were given to patient.  Results were reviewed/authorized by Weston Brass, PharmD.  She was notified by Weston Brass PharmD.         Prior Anticoagulation Instructions: INR 2.0   Continue Coumadin as scheduled:  1/2 tablet every day except 1 tablet on Tuesday and Saturday.  Return to clinic in 4 weeks.    Current Anticoagulation Instructions: INR 2.2  Continue same dose of 1/2 tablet every day except 1 tablet on Tuesday and Saturday. Recheck INR in 4 weeks.

## 2010-04-10 NOTE — Medication Information (Signed)
Summary: rov/ewj  Anticoagulant Therapy  Managed by: Bethena Midget, RN, BSN Referring MD: Jens Som PCP: Ardeen Garland  MD Supervising MD: Riley Kill MD, Maisie Fus Indication 1: Atrial Fibrillation (ICD-427.31) Lab Used: [facility] [place] Site: [address] INR POC 2.2 INR RANGE 2 - 3  Dietary changes: no    Health status changes: no    Bleeding/hemorrhagic complications: no    Recent/future hospitalizations: no    Any changes in medication regimen? no    Recent/future dental: no  Any missed doses?: no       Is patient compliant with meds? yes       Allergies: 1)  ! Doxycycline  Anticoagulation Management History:      The patient is taking warfarin and comes in today for a routine follow up visit.  Positive risk factors for bleeding include an age of 70 years or older.  The bleeding index is 'intermediate risk'.  Positive CHADS2 values include History of HTN.  Negative CHADS2 values include Age > 67 years old.  The start date was 01/13/2008.  Her last INR was 5.2 ratio.  Anticoagulation responsible provider: Riley Kill MD, Maisie Fus.  INR POC: 2.2.  Cuvette Lot#: [id number].  Exp: 12/2010.    Anticoagulation Management Assessment/Plan:      The patient's current anticoagulation dose is Coumadin 5 mg tabs: Take as directed by coumadin clinic..  The target INR is 2.0-3.0.  The next INR is due 11/12/2009.  Anticoagulation instructions were given to patient.  Results were reviewed/authorized by Bethena Midget, RN, BSN.  She was notified by Bethena Midget, RN, BSN.         Prior Anticoagulation Instructions: INR 2.8  Continue on same dosage 2.5mg  daily except 5mg  on Tuesdays and Saturdays.  Recheck in 4 weeks.    Current Anticoagulation Instructions: INR 2.2 Continue 2.5mg s everyday except 5mg s on Tuesdays and Saturdays. Recheck in 4 weeks.

## 2010-04-10 NOTE — Medication Information (Signed)
Summary: rov/ewj  Anticoagulant Therapy  Managed by: Shelby Dubin, PharmD, BCPS, CPP PCP: Ardeen Garland  MD Supervising MD: Shirlee Latch MD, [place] Indication 1: Atrial Fibrillation (ICD-427.31) Lab Used: [facility] [place] Site: [address] INR POC 2.5 INR RANGE 2 - 3  Dietary changes: no    Health status changes: no    Bleeding/hemorrhagic complications: no    Recent/future hospitalizations: no    Any changes in medication regimen? no    Recent/future dental: no  Any missed doses?: no       Is patient compliant with meds? yes       Current Medications (verified): 1)  Singulair 10 Mg  Tabs (Montelukast Sodium) .Marland Kitchen.. 1 Tab Once Daily 2)  Advair Diskus 500-50 Mcg/dose  Misc (Fluticasone-Salmeterol) .Marland Kitchen.. 1 Puff Bid 3)  Proventil Hfa 108 (90 Base) Mcg/act Aers (Albuterol Sulfate) .... Inhale 2 Puff Using Inhaler Every Four Hours 4)  Lisinopril 10 Mg  Tabs (Lisinopril) .... Take 1 Tab By Mouth Daily 5)  Hydrochlorothiazide 12.5 Mg Caps (Hydrochlorothiazide) .Marland Kitchen.. 1 By Mouth Daily For High Blood Pressure 6)  Diltiazem Hcl 90 Mg Tabs (Diltiazem Hcl) .... Take 2 Tabs By Mouth Two Times A Day 7)  Xyzal 5 Mg Tabs (Levocetirizine Dihydrochloride) .... 2 Tabs By Mouth Daily 8)  Coumadin 5 Mg Tabs (Warfarin Sodium) .... Take As Directed By Coumadin Clinic.  Allergies (verified): 1)  ! Doxycycline  Anticoagulation Management History:      The patient is taking warfarin and comes in today for a routine follow up visit.  Positive risk factors for bleeding include an age of 27 years or older.  The bleeding index is 'intermediate risk'.  Positive CHADS2 values include History of HTN.  Negative CHADS2 values include Age > 29 years old.  The start date was 01/13/2008.  Her last INR was 5.2 ratio.  Anticoagulation responsible provider: Shirlee Latch MD, [place].  INR POC: 2.5.  Cuvette Lot#: [id number].  Exp: 06/2010.    Anticoagulation Management Assessment/Plan:      The patient's current  anticoagulation dose is Coumadin 5 mg tabs: Take as directed by coumadin clinic..  The target INR is 2.0-3.0.  The next INR is due 05/20/2009.  Anticoagulation instructions were given to patient.  Results were reviewed/authorized by Shelby Dubin, PharmD, BCPS, CPP.  She was notified by Shelby Dubin PharmD, BCPS, CPP.         Prior Anticoagulation Instructions: INR 2.6  Continue on same dosage 1/2 tablet daily except 1 tablet on Tuesdays and Saturdays.   Recheck in 4 weeks.    Current Anticoagulation Instructions: INR 2.5  Continue 0.5 tab daily except 1 tab Tuesdays and Saturdays.  Recheck in 4 weeks.  The patient is to hold four doses of coumadin.  The dosage to be resumed includes:

## 2010-04-16 ENCOUNTER — Encounter (INDEPENDENT_AMBULATORY_CARE_PROVIDER_SITE_OTHER): Payer: Self-pay

## 2010-04-16 ENCOUNTER — Encounter: Payer: Self-pay | Admitting: Internal Medicine

## 2010-04-16 DIAGNOSIS — Z7901 Long term (current) use of anticoagulants: Secondary | ICD-10-CM

## 2010-04-16 DIAGNOSIS — I4891 Unspecified atrial fibrillation: Secondary | ICD-10-CM

## 2010-04-16 LAB — CONVERTED CEMR LAB: POC INR: 1.9

## 2010-04-24 NOTE — Medication Information (Signed)
Summary: Coumadin Clinic  Anticoagulant Therapy  Managed by: Bethena Midget, RN, BSN Referring MD: Jens Som PCP: Ardeen Garland  MD Supervising MD: Graciela Husbands MD, Viviann Spare Indication 1: Atrial Fibrillation (ICD-427.31) Lab Used: [facility] [place] Site: [address] INR POC 1.9 INR RANGE 2 - 3  Dietary changes: yes       Details: Eating more leafy veggies  Health status changes: no    Bleeding/hemorrhagic complications: no    Recent/future hospitalizations: no    Any changes in medication regimen? no    Recent/future dental: no  Any missed doses?: no       Is patient compliant with meds? yes       Allergies: 1)  ! Doxycycline  Anticoagulation Management History:      The patient is taking warfarin and comes in today for a routine follow up visit.  Positive risk factors for bleeding include an age of 7 years or older.  The bleeding index is 'intermediate risk'.  Positive CHADS2 values include History of HTN.  Negative CHADS2 values include Age > 37 years old.  The start date was 01/13/2008.  Her last INR was 5.2 ratio.  Anticoagulation responsible provider: Graciela Husbands MD, Viviann Spare.  INR POC: 1.9.  Cuvette Lot#: [id number].  Exp: 03/2011.    Anticoagulation Management Assessment/Plan:      The patient's current anticoagulation dose is Coumadin 5 mg tabs: Take as directed by coumadin clinic..  The target INR is 2.0-3.0.  The next INR is due 05/14/2010.  Anticoagulation instructions were given to patient.  Results were reviewed/authorized by Bethena Midget, RN, BSN.  She was notified by Bethena Midget, RN, BSN.         Prior Anticoagulation Instructions: INR 2.4 Continue 1/2 pill everyday except 1 pill on Tuesdays and Saturdays. Recheck in 4 weeks.   Current Anticoagulation Instructions: INR 1.9 Today take extra 1/2 pill then resume 1/2 pill everyday except 1 pill on Tuesdays and Saturdays. Recheck in 4 weeks.

## 2010-05-14 ENCOUNTER — Encounter (INDEPENDENT_AMBULATORY_CARE_PROVIDER_SITE_OTHER): Payer: Self-pay

## 2010-05-14 ENCOUNTER — Encounter: Payer: Self-pay | Admitting: Internal Medicine

## 2010-05-14 DIAGNOSIS — Z7901 Long term (current) use of anticoagulants: Secondary | ICD-10-CM

## 2010-05-14 DIAGNOSIS — I4891 Unspecified atrial fibrillation: Secondary | ICD-10-CM

## 2010-05-14 LAB — CONVERTED CEMR LAB: POC INR: 3.4

## 2010-05-15 ENCOUNTER — Ambulatory Visit (INDEPENDENT_AMBULATORY_CARE_PROVIDER_SITE_OTHER): Payer: Self-pay | Admitting: Family Medicine

## 2010-05-15 ENCOUNTER — Ambulatory Visit (HOSPITAL_COMMUNITY)
Admission: RE | Admit: 2010-05-15 | Discharge: 2010-05-15 | Disposition: A | Discharge status: Home / Self Care | Payer: Self-pay | Source: Ambulatory Visit | Attending: Family Medicine | Admitting: Family Medicine

## 2010-05-15 ENCOUNTER — Telehealth: Payer: Self-pay | Admitting: Family Medicine

## 2010-05-15 VITALS — BP 145/85 | HR 89 | Temp 98.0°F | Ht 63.0 in | Wt 101.4 lb

## 2010-05-15 DIAGNOSIS — J449 Chronic obstructive pulmonary disease, unspecified: Secondary | ICD-10-CM

## 2010-05-15 DIAGNOSIS — J438 Other emphysema: Secondary | ICD-10-CM | POA: Insufficient documentation

## 2010-05-15 DIAGNOSIS — R059 Cough, unspecified: Secondary | ICD-10-CM | POA: Insufficient documentation

## 2010-05-15 DIAGNOSIS — R05 Cough: Secondary | ICD-10-CM | POA: Insufficient documentation

## 2010-05-15 DIAGNOSIS — R509 Fever, unspecified: Secondary | ICD-10-CM | POA: Insufficient documentation

## 2010-05-15 LAB — CONVERTED CEMR LAB
Basophils Absolute: 0 10*3/uL (ref 0.0–0.1)
Eosinophils Relative: 1 % (ref 0–5)
HCT: 46.6 % — ABNORMAL HIGH (ref 36.0–46.0)
Lymphocytes Relative: 28 % (ref 12–46)
Lymphs Abs: 2.7 10*3/uL (ref 0.7–4.0)
Neutro Abs: 5.9 10*3/uL (ref 1.7–7.7)
Platelets: 350 10*3/uL (ref 150–400)
RDW: 13.3 % (ref 11.5–15.5)
WBC: 9.6 10*3/uL (ref 4.0–10.5)

## 2010-05-15 LAB — CBC WITH DIFFERENTIAL/PLATELET
Basophils Relative: 0 % (ref 0–1)
Eosinophils Absolute: 0.1 10*3/uL (ref 0.0–0.7)
Hemoglobin: 16.2 g/dL — ABNORMAL HIGH (ref 12.0–15.0)
Lymphs Abs: 2.7 10*3/uL (ref 0.7–4.0)
MCHC: 34.8 g/dL (ref 30.0–36.0)
Monocytes Relative: 10 % (ref 3–12)
Neutro Abs: 5.9 10*3/uL (ref 1.7–7.7)
Neutrophils Relative %: 61 % (ref 43–77)
Platelets: 350 10*3/uL (ref 150–400)
RBC: 4.95 MIL/uL (ref 3.87–5.11)

## 2010-05-15 MED ORDER — IPRATROPIUM BROMIDE 0.02 % IN SOLN: 0.5000 mg | RESPIRATORY_TRACT | Status: DC

## 2010-05-15 MED ORDER — IPRATROPIUM BROMIDE 0.02 % IN SOLN
0.5000 mg | Freq: Once | RESPIRATORY_TRACT | Status: AC
  Administered 2010-05-15: 0.5 mg via RESPIRATORY_TRACT

## 2010-05-15 MED ORDER — AMOXICILLIN 250 MG/5ML PO SUSR: 50.0000 mg/kg/d | Freq: Three times a day (TID) | ORAL | Status: DC

## 2010-05-15 MED ORDER — ALBUTEROL SULFATE (2.5 MG/3ML) 0.083% IN NEBU
2.5000 mg | INHALATION_SOLUTION | Freq: Once | RESPIRATORY_TRACT | Status: AC
  Administered 2010-05-15: 2.5 mg via RESPIRATORY_TRACT

## 2010-05-15 MED ORDER — PREDNISONE 20 MG PO TABS: 60.0000 mg | ORAL_TABLET | Freq: Every day | ORAL | Status: AC

## 2010-05-15 MED ORDER — ALBUTEROL SULFATE (2.5 MG/3ML) 0.083% IN NEBU: 2.5000 mg | INHALATION_SOLUTION | RESPIRATORY_TRACT | Status: DC

## 2010-05-15 NOTE — Assessment & Plan Note (Addendum)
Provided patient with nebulizer of Albuterol/Atrovent. Patient much improved s/p treatment.  Pulse ox improved to 92%.  Patient still with wheezing on exam, worse at bases, on post-treatment physical exam, but wheezing much improved overall.  Likely chronically hypoxic due to COPD.  Record review shows pulse ox ranging from 92 - 94% since 2008. Patient febrile here in clinic -- she was very surprised upon learning this.  Denies any fevers/chills at home.   No focal signs of infection on exam.  Plan to perform chest xray today to assess for PNA in patient with cough and fever.   Will call patient with results.   CBC in clinic with diff today.   Most likely will treat as COPD exacerbation caused by febrile viral URI - will treat with Bactrim as pt allergic to Doxycycline, oral steroids, but await CXR results.    Received CXR results - provided documentation in Telephone Note.   Treat as COPD exac.

## 2010-05-15 NOTE — Telephone Encounter (Signed)
Called and spoke with patient to review CXR results.  No PNA on xray.  Will treat as COPD exacerbation.  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  Gave verbal instructions to fu next week, she can cancel appt if much improved.  Patient expressed understanding and gratitude.

## 2010-05-15 NOTE — Progress Notes (Signed)
  Subjective:    Patient ID: [name], female    DOB: 02-09-1941, 70 y.o.   MRN: [id number]  HPI 1.  Dyspnea:  Has had increasing dyspnea for past 2 weeks.  Tried to put off coming to clinic because she's always put on Prednisone, but states that she doesn't like this as it causes problems with her INR levels.  Has had cough, increasing dypsnea on exertion.  Some runny nose, nasal congestion at beginning of symptoms 2 weeks ago, but no longer.  Has been using Advair twice daily as prescribed, Albuterol 6-7 x daily with little relief.  No fevers, chills, chest pain, palpitations, nausea/vomiting/diarrhea.     Review of Systems See HPI above for review of systems.       Objective:   Physical Exam Gen:  Thin lady sitting on edge of exam table in no acute distress.  Vital signs reviewed, RR of 17 calculated.   HEENT:  [place]/AT.  EOMI, PERRL.  MMM, tonsils non-erythematous, non-edematous.  External ears WNL, Bilateral TM's normal without retraction, redness or bulging.  Cardiac:  Regular rate and rhythm without murmur auscultated.  Good S1/S2. Pulm:  Diffuse wheezing throughout all lung fields.  Prolonged expiratory phase present.  No rales noted.  No intercostal or subcostal retractions/no accessory muscles of respiration noted. Ext:  No clubbing/cyanosis/erythema.  No edema noted bilateral lower extremities.              Assessment & Plan:

## 2010-05-16 ENCOUNTER — Telehealth: Payer: Self-pay | Admitting: Cardiology

## 2010-05-20 ENCOUNTER — Encounter: Payer: Self-pay | Admitting: Cardiovascular Disease

## 2010-05-20 ENCOUNTER — Encounter (INDEPENDENT_AMBULATORY_CARE_PROVIDER_SITE_OTHER): Payer: Self-pay

## 2010-05-20 DIAGNOSIS — I4891 Unspecified atrial fibrillation: Secondary | ICD-10-CM

## 2010-05-20 DIAGNOSIS — Z7901 Long term (current) use of anticoagulants: Secondary | ICD-10-CM

## 2010-05-20 LAB — CONVERTED CEMR LAB: POC INR: 3.4

## 2010-05-20 NOTE — Medication Information (Signed)
Summary: rov/tm  Anticoagulant Therapy  Managed by: Windell Hummingbird, RN Referring MD: Jens Som PCP: Ardeen Garland  MD Supervising MD: Graciela Husbands MD, Viviann Spare Indication 1: Atrial Fibrillation (ICD-427.31) Lab Used: [facility] [place] Site: [address] INR POC 3.4 INR RANGE 2 - 3  Dietary changes: yes       Details: eating less leafy greens  Health status changes: yes       Details: Breathing difficulty.  Expect to go back on prednisone as soon as sees MD.  Bleeding/hemorrhagic complications: no    Recent/future hospitalizations: no    Any changes in medication regimen? no    Recent/future dental: no  Any missed doses?: no       Is patient compliant with meds? yes      Comments: Pt. will call us if/when begins prednisone treatments.  Allergies: 1)  ! Doxycycline  Anticoagulation Management History:      The patient is taking warfarin and comes in today for a routine follow up visit.  Positive risk factors for bleeding include an age of 11 years or older.  The bleeding index is 'intermediate risk'.  Positive CHADS2 values include History of HTN.  Negative CHADS2 values include Age > 66 years old.  The start date was 01/13/2008.  Her last INR was 5.2 ratio.  Anticoagulation responsible provider: Graciela Husbands MD, Viviann Spare.  INR POC: 3.4.  Cuvette Lot#: [id number].  Exp: 03/2011.    Anticoagulation Management Assessment/Plan:      The patient's current anticoagulation dose is Coumadin 5 mg tabs: Take as directed by coumadin clinic..  The target INR is 2.0-3.0.  The next INR is due 06/04/2010.  Anticoagulation instructions were given to patient.  Results were reviewed/authorized by Windell Hummingbird, RN.  She was notified by Windell Hummingbird, RN.         Prior Anticoagulation Instructions: INR 1.9 Today take extra 1/2 pill then resume 1/2 pill everyday except 1 pill on Tuesdays and Saturdays. Recheck in 4 weeks.   Current Anticoagulation Instructions: INR 3.4 Skip Thursday's dose.  Then resume  taking 1/2 tablet every day, except take 1 tablet on Tuesdays and Saturdays. Recheck in 3 weeks.

## 2010-05-20 NOTE — Progress Notes (Signed)
Summary: pt start on presidone  Phone Note Call from Patient Call back at Home Phone [phone]   Caller: Patient Reason for Call: Talk to Nurse Summary of Call: pt has started her presidone was told to call the office.  Initial call taken by: Lorne Skeens,  May 16, 2010 10:39 AM  Follow-up for Phone Call        spoke with pt, pt was calling to let the coumadin clinic she had restarted her prednisone. will foward to cvrr Deliah Goody, RN  May 16, 2010 3:03 PM  Called spoke with pt made OV to check PT/INR on 05/21/10. Follow-up by: Cloyde Reams RN,  May 16, 2010 5:04 PM

## 2010-05-27 NOTE — Medication Information (Signed)
Summary: started on prednisone and amoxicillin on 05/16/10/ewj  Anticoagulant Therapy  Managed by: Weston Brass, PharmD Referring MD: Jens Som PCP: Ardeen Garland  MD Supervising MD: Clifton James MD, Cristal Deer Indication 1: Atrial Fibrillation (ICD-427.31) Lab Used: [facility] [place] Site: [address] INR POC 3.4 INR RANGE 2 - 3  Dietary changes: yes       Details: decreased greens over past 5 days  Health status changes: no    Bleeding/hemorrhagic complications: no    Recent/future hospitalizations: no    Any changes in medication regimen? yes       Details: on Prednisone since last week.  Last dose today. Will finish amoxicillin tomorrow  Recent/future dental: no  Any missed doses?: no       Is patient compliant with meds? yes       Allergies: 1)  ! Doxycycline  Anticoagulation Management History:      The patient is taking warfarin and comes in today for a routine follow up visit.  Positive risk factors for bleeding include an age of 70 years or older.  The bleeding index is 'intermediate risk'.  Positive CHADS2 values include History of HTN.  Negative CHADS2 values include Age > 52 years old.  The start date was 01/13/2008.  Her last INR was 5.2 ratio.  Anticoagulation responsible provider: Clifton James MD, Cristal Deer.  INR POC: 3.4.  Exp: 03/2011.    Anticoagulation Management Assessment/Plan:      The patient's current anticoagulation dose is Coumadin 5 mg tabs: Take as directed by coumadin clinic..  The target INR is 2.0-3.0.  The next INR is due 06/10/2010.  Anticoagulation instructions were given to patient.  Results were reviewed/authorized by Weston Brass, PharmD.  She was notified by Weston Brass PharmD.         Prior Anticoagulation Instructions: INR 3.4 Skip Thursday's dose.  Then resume taking 1/2 tablet every day, except take 1 tablet on Tuesdays and Saturdays. Recheck in 3 weeks.  Current Anticoagulation Instructions: INR 3.4  Skip today's dose of  Coumadin then resume same dose of 1/2 tablet every day except 1 tablet on Tuseday and Saturday.  Recheck INR in 3 weeks.

## 2010-06-10 ENCOUNTER — Ambulatory Visit (INDEPENDENT_AMBULATORY_CARE_PROVIDER_SITE_OTHER): Payer: Self-pay | Admitting: *Deleted

## 2010-06-10 DIAGNOSIS — Z7901 Long term (current) use of anticoagulants: Secondary | ICD-10-CM

## 2010-06-10 DIAGNOSIS — I4891 Unspecified atrial fibrillation: Secondary | ICD-10-CM

## 2010-06-10 NOTE — Patient Instructions (Signed)
INR 2.3  Continue same dose of 1/2 tablet every day except 1 tablet on Tuesday and Saturday.  Recheck INR in 4 weeks.

## 2010-07-08 ENCOUNTER — Encounter: Payer: Self-pay | Admitting: *Deleted

## 2010-07-09 ENCOUNTER — Ambulatory Visit (INDEPENDENT_AMBULATORY_CARE_PROVIDER_SITE_OTHER): Payer: Self-pay | Admitting: *Deleted

## 2010-07-09 DIAGNOSIS — Z7901 Long term (current) use of anticoagulants: Secondary | ICD-10-CM

## 2010-07-09 DIAGNOSIS — I4891 Unspecified atrial fibrillation: Secondary | ICD-10-CM

## 2010-07-14 ENCOUNTER — Encounter: Payer: Self-pay | Admitting: Home Health Services

## 2010-07-22 NOTE — Discharge Summary (Signed)
NAMEVERENISE, [name] NO.:  [id number]   MEDICAL RECORD NO.:  [id number]          PATIENT TYPE:  INP   LOCATION:  5128                         FACILITY:  [facility]   PHYSICIAN:  Pearlean Brownie, M.D.DATE OF BIRTH:  1940-09-18   DATE OF ADMISSION:  02/24/2007  DATE OF DISCHARGE:  02/25/2007                               DISCHARGE SUMMARY   PRIMARY CARE PHYSICIAN:  Ardeen Garland, M.D., [facility].   DISCHARGE DIAGNOSES:  1. Chronic obstructive pulmonary disease exacerbation.  2. Uncontrolled asthma.  3. Hypertension.  4. Tobacco dependence.  5. Osteoporosis.  6. History of alcohol abuse.   DISCHARGE MEDICATIONS:  1. Prednisone 40 mg 2 tablets p.o. daily x7 days.  2. Cardura 1 mg p.o. b.i.d.  3. Singulair 10 mg p.o. daily.  4. Diovan 160 mg p.o. daily.  5. Spiriva 18 mcg inhaler 1 puff in the morning daily.  6. Chantix starter pack per manufacturer instructions  7. Advair 500/50 discus 1 puff twice daily.  8. Albuterol 90 mcg MDI 2 puffs q.4h. as needed for wheezing.   CONSULTS:  None.   PROCEDURES:  None.   LABS:  On admission, patient had an ABG with pH of 7.46, CO2 38.6, and  O2 of 59.3.  White blood cell count was 8.3, hemoglobin 15.3, and  platelets of 264.  Sodium 136, potassium 4, chloride 98, bicarb 28, BUN  12, creatinine 0.86, glucose 104.   Patient had a chest x-ray that was significant for COPD/emphysema with  no acute cardiopulmonary abnormalities.   BRIEF HOSPITAL COURSE:  This is a 70 year old female with a history of  asthma and COPD that failed outpatient treatment of COPD on prednisone  and doxycycline that was admitted for dyspnea.   1. Dyspnea:  This was likely a missed picture of uncontrolled asthma      and COPD exacerbation.  Patient was admitted and started on O2 by      nasal cannula and also given nebulizer treatments q.4h. for her      wheezing.  She was also started on a new medication, Spiriva 1 puff  in the morning daily.  She was continued on her Advair and also on      her albuterol inhaler as needed and also on her Singulair.  She was      restarted on her prednisone 60 mg and was transitioned to 40 mg      daily upon discharge.  She had a chest x-ray that was significant      for COPD and emphysema with no acute cardiac changes.  No pneumonia      present.  Patient's work of breathing improved overnight.  The next      morning, she denied any dyspnea and was ready to go home.  She was      put on ambulation tests and tested for her O2 saturation.  She was      able to walk down the hall without O2, and her sats remained at      92%.  She will have close followup in January and finish one  week's      dose of prednisone 40 mg.   1. Hypertension:  She was continued on her Diovan and Cardura.  While      hospitalized, her blood pressures remained stable.   1. Tobacco dependence:  Patient was given a nicotine patch during her      hospitalization.  She was also consulted on smoking cessation.  She      states that she understands that her COPD exacerbations and      uncontrolled asthma are related to her tobacco use, and she was      willing to restart her Chantix and attempt to quit smoking.  She      was given a prescription for the Chantix starter packs, as she      stopped using the Chantix for several weeks now.   1. History of alcohol abuse:  The patient was thought to have symptoms      of withdrawal, although this is unclear at this point; however, she      was placed on Ativan protocol overnight.  She claims that her      trembling and her symptoms were related to anxiety and not to      alcohol withdrawal.  She states that she only drinks one glass of      wine a night.  The Ativan protocol was discontinued.  She was not      discharged with any Ativan.   DISCHARGE INSTRUCTIONS:  Patient is to be on a low sodium, heart-healthy  diet.  No restrictions for activity.  No  restrictions or instructions  for wound care.  She is to call her primary care doctor or return to the  ED if shortness of breath is not improved with her home meds.   FOLLOW-UP APPOINTMENTS:  She is to follow up with Dr. Georgiana Shore at [facility], phone number [phone], on March 21, 2007 at 1:30  p.m.   DISCHARGE CONDITION:  She was in stable condition.      Marisue Ivan, MD  Electronically Signed      Pearlean Brownie, M.D.  Electronically Signed    KL/MEDQ  D:  02/25/2007  T:  02/26/2007  Job:  [id number]   cc:   Ardeen Garland, MD

## 2010-07-22 NOTE — H&P (Signed)
[name], [name]:  [id number]   MEDICAL RECORD NO.:  [id number]          PATIENT TYPE:  INP   LOCATION:  3729                         FACILITY:  [facility]   PHYSICIAN:  Pearlean Brownie, M.D.DATE OF BIRTH:  Jul 13, 1940   DATE OF ADMISSION:  11/29/2007  DATE OF DISCHARGE:  12/01/2007                              HISTORY & PHYSICAL   PRIMARY CARE [name]:  Dr. Ardeen Garland, [facility].   CHIEF COMPLAINT:  Irregular heart rhythm.   HISTORY OF PRESENT ILLNESS:  A 70 year old female with past medical  history significant for hypertension, COPD, asthma, presented to [facility] ED with refractory nosebleed, which resolved with oxymetazoline  drops by EMS.  Upon evaluation in ED, the patient found to have atrial  fibrillation with rapid ventricular response.  The patient denied  history of AFib, denied chest pain, palpitations, acute shortness of  breath, dizziness, lightheadedness.  Cardizem drip was started in the  [facility] ED at 10 mg per hour as heart rate was in the 160s.  Blood  pressure dropped and the patient was given 500 mL bolus.  Upon transfer  to [facility] care, Cardizem was decreased to 5 mg per hour.  The patient is  currently asymptomatic from atrial fibrillation with a heart rate of  110.   PAST MEDICAL HISTORY:  1. Hypertension.  2. COPD.  3. Asthma.  4. Osteoporosis.  5. Arthritis.  6. Tobacco dependence.  7. 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   PAST SURGICAL HISTORY:  1. Bilateral inguinal hernia.  2. Tubal ligation.   MEDICATIONS:  1. Diovan 160 mg daily.  2. Singulair 10 mg daily.  3. Cardura 1 mg b.i.d.  4. Advair 550 1 puff b.i.d.  5. Proventil 90 mcg p.r.n.   New medications the patient has not recently started:  1. Lisinopril 10 mg daily.  2. Hydrochlorothiazide 12.5 mg daily.   ALLERGIES:  The patient states no known drug allergies.  However PCP  system has DOXYCYCLINE.   FAMILY  HISTORY:  Hypertension, mother.   SOCIAL HISTORY:  Lives with husband, retired Production designer, theatre/television/film for [employer].  Positive tobacco, 50-pack years, EtOH, glass of wine with  dinner, illicit drugs denied.   REVIEW OF SYSTEMS:  Positive nasal congestion, positive nonproductive  cough, negative fever, negative nausea/vomiting, negative abdominal  symptoms.  All other per HPI.   PHYSICAL EXAMINATION:  VITAL SIGNS:  Temperature 98.3.  Heart rate 93,  range 87-94.  Per RN, maximum heart rate 130, not sustained.  BP 93/74.  O2 sat 93-95% on 2 liters.  Respiratory rate 18-20.  GENERAL:  No acute distress.  Alert and oriented x3.  Pleasant.  HEENT:  PERRL.    DICTATION ENDED AT THIS POINT.      [name], MD       Pearlean Brownie, M.D.     KD/MEDQ  D:  11/30/2007  T:  12/01/2007  Job:  [id number]

## 2010-07-22 NOTE — Assessment & Plan Note (Signed)
[facility]                            CARDIOLOGY OFFICE NOTE   NOBUKO, GSELL                    MRN:          [id number]  DATE:04/25/2008                            DOB:          Aug 21, 1940    Ms. Beery is a very pleasant female who has a history of COPD as well  as paroxysmal atrial fibrillation.  I last saw her on January 13, 2008.  At that time, we scheduled a Myoview which was performed on January 19, 2008.  She was found to have inferoseptal thinning, but there was no  scar or ischemia.  Her ejection fraction was 76%.  Note, her LV function  has been normal by previous echocardiogram as well.  Since I last saw  her, she does have dyspnea on exertion, which has been a long term  issue.  It is unchanged compared to previous.  It resolves promptly with  rest.  There is no orthopnea or PND.  She occasionally has mild right  pedal edema.  There is no chest pain, palpitations, or syncope.   MEDICATIONS:  1. Aspirin 325 mg p.o. daily.  2. Singulair 10 mg p.o. daily.  3. HCTZ 12.5 mg p.o. daily.  4. Advair Diskus.  5. Lisinopril 10 mg p.o. daily.  6. Cardizem 90 mg tablets 2 p.o. b.i.d.  7. Coumadin as directed.   PHYSICAL EXAMINATION:  VITAL SIGNS:  Today shows a blood pressure of  142/76 and her pulse is 80.  HEENT:  Normal.  NECK:  Supple.  CHEST:  Diminished breath sounds.  CARDIOVASCULAR:  Regular rhythm.  ABDOMEN:  There is no tenderness.  EXTREMITIES:  No edema.   Her electrocardiogram shows a sinus rhythm at a rate of 80.  There are  nonspecific ST changes.   DIAGNOSES:  1. Paroxysmal atrial fibrillation - the patient remains in sinus      rhythm on examination.  We will continue with her Cardizem for rate      control, if her atrial fibrillation recurs.  We will also continue      with Coumadin with goal INR of 2-3.  She has an embolic risk factor      of hypertension.  However, she is also a female and is greater  than      70 years of age.  Note, her left ventricular function is normal.  I      will discontinue her aspirin.  2. Hypertension - blood pressure is adequately controlled on present      medications.  3. Chronic obstructive pulmonary disease.  4. Tobacco abuse - we discussed the importance of discontinuing this.  5. History of osteoporosis.  6. Zenker diverticulum.   We will see her back in 6 months.     Madolyn Frieze Jens Som, MD, [facility]  Electronically Signed    BSC/MedQ  DD: 04/25/2008  DT: 04/26/2008  Job #: [id number]   cc:   Ardeen Garland, MD

## 2010-07-22 NOTE — Assessment & Plan Note (Signed)
[place]                            CARDIOLOGY OFFICE NOTE   [name], [name]                    MRN:          [id number]  DATE:01/13/2008                            DOB:          1940-08-15    The patient is a very pleasant 70 year old female who was recently  admitted to [facility].  She had presented to the [facility] ED  with a nose bleed.  However, in the emergency room, she was noted to  have tachycardic pulse and also irregularity.  She was found to be in  atrial fibrillation and was transferred to [facility] for  admission.  She was placed on Cardizem and converted spontaneously to  normal sinus rhythm.  She was changed to p.o. Cardizem and treated with  aspirin, particularly in light of her recent nose bleed.  Note, she did  have a TSH during that admission that was normal at 2.910.  She also had  an echocardiogram on November 29, 2007, which showed an ejection  fraction of 60-70%.  Since that time, she does have some dyspnea on  exertion, but attributes this to her lungs.  There is no orthopnea, PND,  or pedal edema.  She has not had palpitations, chest pain, or syncope.  Note, her dyspnea is unchanged compared to previous.  It resolves  promptly with rest.  There is no associated chest pain.   MEDICATIONS AT PRESENT:  1. Cardizem 180 mg p.o. daily.  2. Aspirin 325 mg p.o. daily.  3. Singulair 10 mg p.o. daily.  4. HCTZ 12.5 mg p.o. daily.  5. Advair Diskus.  6. Lisinopril 10 mg p.o. daily.   PHYSICAL EXAMINATION:  VITAL SIGNS:  Today, shows a blood pressure of  151/92 and the pulse of 93.  She weighs 104 pounds.  HEENT:  Normal.  NECK:  Supple.  CHEST:  Clear.  CARDIOVASCULAR:  Regular rate and rhythm.  ABDOMEN:  No tenderness.  EXTREMITIES:  No edema.   Electrocardiogram shows sinus rhythm at a rate of 93.  There are no ST  changes noted.   DIAGNOSES:  1. Paroxysmal atrial fibrillation - Ms. Kasper  remains in sinus rhythm      today.  Her left ventricular function is normal.  We will continue      with Cardizem for rate control.  I had long discussions with her      today concerning the risk of an embolic event.  She has a CHADS2      score of 1 for hypertension, but she is also female and age greater      than 57.  I therefore feel that her risk of an embolic event is      higher.  I have recommended Coumadin and she agrees.  She cannot      obtain the drug immediately due to finances.  However, we will      begin with 5 mg 3 times daily when she is able and then proceed      with 5 mg alternating with 2.5 mg p.o. daily.  She will then  have      her INR checked 4 days after initiating this in our Coumadin Clinic      and be followed there.  She will discontinue her aspirin after      starting the Coumadin.  We will also schedule her to have an      adenosine Myoview for risk stratification.  2. Hypertension - Blood pressure is mildly elevated.  We will track      this and increase her lisinopril as needed.  3. Chronic obstructive pulmonary disease.  4. Tobacco abuse - We discussed the importance of discontinuing this      for between 3-10 minutes.  5. History of osteoporosis.  6. History of Zenker diverticulum.   I will see her back in approximately 3 months or sooner if necessary.     Madolyn Frieze Jens Som, MD, [facility]  Electronically Signed    BSC/MedQ  DD: 01/13/2008  DT: 01/13/2008  Job #: [id number]   cc:   Ardeen Garland, MD

## 2010-07-22 NOTE — H&P (Signed)
NAMEDEICY, RUSK             ACCOUNT NO.:  [id number]   MEDICAL RECORD NO.:  [id number]          PATIENT TYPE:  INP   LOCATION:  3729                         FACILITY:  [facility]   PHYSICIAN:  Santiago Bumpers. Hensel, M.D.DATE OF BIRTH:  Dec 04, 1940   DATE OF ADMISSION:  11/29/2007  DATE OF DISCHARGE:                              HISTORY & PHYSICAL   PRIMARY CARE Dhana Totton:  Ardeen Garland, MD, [facility]   CHIEF COMPLAINT:  Irregular heart rhythm.   HISTORY OF PRESENT ILLNESS:  A 70 year old female with past medical  history significant for hypertension, COPD, and asthma, who presented to  [facility] ED with refractory nosebleed which resolved with  oxymetazoline drops by EMS.  Upon evaluation in the ED, the patient  found to have atrial fibrillation with rapid ventricular response.  The  patient denied history of AFib.  Denied chest pain, palpitations, acute  shortness of breath, dizziness, and lightheadedness.  Cardizem drip  started in [facility] ED at 10 mg/hour as heart rate was in the 160s.  Blood pressure dropped and the patient was given 500 mL bolus.  Upon  transfer to [facility], Cardizem was decreased to 5 mg/hour.  Patient is  currently asymptomatic from atrial fibrillation.   PAST MEDICAL HISTORY:  1. Hypertension.  2. COPD.  3. Asthma.  4. Osteoporosis.  5. Arthritis.  6. Tobacco dependence.  7. 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   PAST SURGICAL HISTORY:  1. Bilateral inguinal hernia.  2. Tubal ligation.   MEDICATIONS:  1. Diovan 160 mg daily.  2. Singulair 10 mg daily.  3. Cardura 1 mg b.i.d.  4. Advair 1 puff b.i.d.  5. Proventil 90 mcg p.r.n.   NEW MEDICATIONS:  The patient has not recently started;  1. Lisinopril 10 mg daily.  2. HCTZ 12.5 mg daily.   ALLERGIES:  No known drug allergies.  Questionable allergy to  DOXYCYCLINE.   FAMILY HISTORY:  Hypertension in mother.   SOCIAL HISTORY:  Lives with husband, retired  Secretary/administrator.  Positive tobacco 50 pack years.  EtOH, glass of wine with  dinner nightly.  Denies illicit drugs.   REVIEW OF SYSTEMS:  Positive nasal congestion.  Positive nonproductive  cough.  Denies fever, nausea, vomiting, and abdominal symptoms,  otherwise, per HPI.   PHYSICAL EXAMINATION:  VITAL SIGNS:  Temperature 98.3; heart rate 93 per  RN, maximum heart rate 130 nonsustained; blood pressure 93/74; O2 sat 93-  95% on 2 L; and respiratory rate 18 and 20.  GENERAL:  No acute distress, alert and oriented x3, pleasant.  HEENT:  PERRLA, nonicteric.  EOMI.  Oropharynx is clear.  Moist mucous  membranes.  Dried blood in nostrils bilaterally.  No active bleeding.  NECK:  No lymphadenopathy.  No thyromegaly.  No bruit.  CVS:  Irregularly irregular rhythm.  Telemetry shows atrial  fibrillation.  RESPIRATORY:  Expiratory wheeze bilaterally.  ABDOMEN:  Positive bowel sounds.  Soft, nontender, and nondistended.  No  masses.  EXTREMITIES:  Pulses are 2+.  No edema.  No cyanosis.  NEURO:  No focal  deficits.  Cranial nerves II-XII grossly intact.   LABORATORY DATA:  Chest x-ray, no acute abnormalities and COPD.  EKG,  AFib with rapid ventricular response.  INR 0.9, PT 12.2, and PTT 28.  BMET; sodium 141, potassium 4.0, chloride 104, CO2 25, BUN 17,  creatinine 0.9, glucose 141, calcium 9.4, total protein 7.0, albumin  4.3, total bili 0.5, AST 26, ALT 18, and alk phos 103.  Point-of-care  negative x1.  CBC; white count 9.3, hemoglobin 14.9, hematocrit 43.2,  and platelets 290 differential within normal limits.   ASSESSMENT AND PLAN:  A 70 year old female with new-onset atrial  fibrillation.  1. Atrial fibrillation.  We will admit to cardiac intensive care unit.      New onset atrial fibrillation fib.  We will obtain TSH level, 2-D      echo to evaluate atrial fibrillation.  No history of new medication      started.  No history of atrial fibrillation.  We will  continue      Cardizem drip.  Hold all other blood pressure medications.  [place]      score of 1.  We will start aspirin 325 mg daily.  No evidence of      ischemia on EKG and cardiac enzymes x1.  Other possibilities      include COPD, however, the patient is not currently with acute      flare.  The patient is stable.  Continue rate control.  We will      consult Cardiology in a.m.  2. Epistaxis resolved.  INR within normal limits.  No history of      bleeding disorder.  We will monitor.  3. Hypertension.  Blood pressure, currently well.  Hypertensive on      Cardizem drip.  We will hold new home BP medications and monitor.  4. Chronic obstructive pulmonary disease.  Currently sating 92-95% on      2 L.  We will wean O2 to room air to keep sats greater than 92%.      Continue Advair and albuterol p.r.n. Continue Singulair daily.      Currently without chronic obstructive pulmonary disease flare      exacerbation.   Gastrointestinal and deep vein thrombosis prophylaxis.  Protonix 40 mg  p.o. daily and Lovenox 40 mg subcu daily.   DISPOSITION:  Pending cardiac evaluation.      [name], MD  Electronically Signed      Santiago Bumpers. Leveda Anna, M.D.  Electronically Signed    KD/MEDQ  D:  11/30/2007  T:  12/01/2007  Job:  [id number]

## 2010-07-22 NOTE — Discharge Summary (Signed)
NAMEGAYLA, [name]             ACCOUNT NO.:  [id number]   MEDICAL RECORD NO.:  [id number]          PATIENT TYPE:  INP   LOCATION:  3729                         FACILITY:  [facility]   PHYSICIAN:  Santiago Bumpers. Hensel, M.D.DATE OF BIRTH:  [birth year]   DATE OF ADMISSION:  11/29/2007  DATE OF DISCHARGE:  12/01/2007                               DISCHARGE SUMMARY   Attending- Dr. [facility]   DISCHARGE DIAGNOSES:  1. Paroxysmal atrial fibrillation.  2. Hypertension.  3. Tobacco dependence.  4. Epistaxis.  5. Chronic obstructive pulmonary disease.   CONSULTS:  Cardiology.   PROCEDURES/STUDIES:  1. EKG, atrial fibrillation with rapid ventricular response.  2. Chest x-ray on November 29, 2007, no acute abnormalities, COPD.  3. Echocardiogram, ejection fraction 60%-70%.  No visualized      abnormalities.   DISCHARGE LABS:  TSH 2.910, within normal limits.  Admission CBC within  normal limits.  Hemoglobin 14.9, hematocrit 43.2.  Admission CMET within  normal limits.   BRIEF HISTORY AND PHYSICAL:  A 69 year old female with past medical  history significant for hypertension, COPD, and asthma presented to the  [facility] ED with refractory nosebleed which resolved with Afrin drops  by EMS.  Upon evaluation in the ED, found to have new onset atrial  fibrillation with rapid ventricular response.   HOSPITAL COURSE:  1. Paroxysmal atrial fibrillation.  New onset atrial fibrillation upon      initial evaluation in ED.  The patient's rate controlled on      Cardizem drip.  Cardiology was consulted on hospital day #1.      Atrial fibrillation was self-limited and the patient reverted to      normal sinus rhythm without cardioversion.  Cardizem drip was      discontinued by Cardiology and the patient started on p.o. Cardizem      CD.  The patient's CHADS score was 1 for history of hypertension.      The patient was anticoagulated with full-dose aspirin 325 mg daily.  Due to CHADS score of 1 and recent epistaxis, decision was made to      hold off on anticoagulation with Coumadin.  If the patient      continues to have atrial fibrillation in the near future, we will      consider Coumadin therapy.  Workup for causes of atrial      fibrillation included TSH and echocardiogram.  Both were within      normal limits.  The patient also has history of COPD which may have      been a trigger for paroxysmal atrial fibrillation.  The patient      will follow up with Cardiology in 2-4 weeks.  We will continue      Cardizem, long acting until then.  2. Hypertension.  The patient was initially hypertensive upon      presentation to [facility] while on Cardizem drip.  Cardizem drip      was decreased from 10 mg per hour to 5 mg per hour prior to      switched to p.o. Cardizem.  All other antihypertensives were      discontinued including Diovan and Cardura.  The patient's blood      pressure will be maintained on Cardizem upon discharge.  3. Tobacco dependence.  The patient has history of tobacco dependence      1 pack per day.  The patient was given nicotine patch during      admission.  Was counseled on smoking cessation.  Plans to use      Chantix for smoking cessation as outpatient.  4. Epistaxis.  The patient was initially evaluated in ED due to      refractory epistaxis.  Nosebleed did stop with Afrin drops given by      EMS.  There were no other signs of epistaxis during admission.  The      patient was not fully anticoagulated due to recent nosebleeds.  5. COPD, stable during admission.  Continued on Advair and albuterol      p.r.n.   DISCHARGE MEDICATIONS AND INSTRUCTIONS:  1. Cardizem ER 180 mg p.o. daily.  2. Aspirin 325 mg p.o. daily.  3. Singulair 10 mg p.o. daily.  4. Advair 500/50 Diskus 1 puff b.i.d.  5. Albuterol MDI 1 puff q.4-6 h. p.r.n. shortness of breath.   Discharge Condition- Stable   FOLLOWUP:  Cardiology, Dr. Olga Millers in 2-4  weeks and Dr. Ardeen Garland, [facility], phone number [phone]  within next week.      [name], MD  Electronically Signed      Santiago Bumpers. Leveda Anna, M.D.  Electronically Signed    KD/MEDQ  D:  12/01/2007  T:  12/02/2007  Job:  [id number]   cc:   Madolyn Frieze. Jens Som, MD, [facility]  Ardeen Garland, MD

## 2010-07-22 NOTE — Consult Note (Signed)
NAMEELDA, [name]             ACCOUNT NO.:  [id number]   MEDICAL RECORD NO.:  [id number]          PATIENT TYPE:  INP   LOCATION:  2918                         FACILITY:  [facility]   PHYSICIAN:  Madolyn Frieze. Jens Som, MD, FACCDATE OF BIRTH:  14-Jul-1940   DATE OF CONSULTATION:  11/29/2007  DATE OF DISCHARGE:                                 CONSULTATION   The patient is a 69 year old female with past medical history of  hypertension, COPD, and tobacco abuse, who I am asked to evaluate for  atrial fibrillation.  The patient states she has no prior cardiac  history.  She typically does have dyspnea on exertion, which she has had  for quite some time and feels is most likely secondary to her COPD.  However, there is no orthopnea, PND, pedal edema, or exertional chest  pain.  There is no history of syncope.  She does state that her heart  will occasionally flutter, but this is not sustained.  She developed a  nosebleed last evening.  She presented to the emergency room as she  states it was quite significant.  It did resolve on its own.  However,  she was also noted to be in atrial fibrillation with rapid ventricular  response and was admitted and was placed on IV Cardizem.  Cardiology is  now asked to further evaluate.   MEDICATIONS AT PRESENT:  1. Cardizem drip.  2. Singulair 10 mg daily.  3. Cardura 1 mg p.o. b.i.d., which is on hold.  4. Advair.  5. Albuterol.  6. Hydrochlorothiazide and lisinopril, on hold.  7. Aspirin 325 mg p.o. daily.  8. She is also on Xopenex.   ALLERGIES:  She has an allergy to DOXYCYCLINE.   SOCIAL HISTORY:  She has a long history of tobacco use.  She consumes 1  glass of wine per evening.   FAMILY HISTORY:  Negative for coronary artery disease.   PAST MEDICAL HISTORY:  Significant for hypertension, but there is no  diabetes mellitus or hyperlipidemia.  She does have a history of COPD.  She has a history of depression and pancreatic dysfunction per her  notes.  She also has osteoporosis.  She has a Zenker diverticulum.  She  also has arthritis.  She has had a prior tubal ligation as well as  hernia surgery.   REVIEW OF SYSTEMS:  She denies any headaches, fevers, or chills.  There  is no productive cough or hemoptysis.  There is no dysphagia,  odynophagia, melena, or hematochezia.  There is no dysuria or hematuria.  No rash or seizure activity.  There is no orthopnea, PND, or pedal  edema.  Remaining systems are negative.   PHYSICAL EXAMINATION:  VITAL SIGNS:  Blood pressure 107/75 and her pulse  is 83.  She is not febrile.  GENERAL:  She is well developed and somewhat frail.  She is in no acute  distress at present.  She does not appear to be depressed.  There is no  peripheral clubbing.  SKIN:  Warm and dry.  BACK:  Normal.  HEENT:  Normal with normal eyelids.  NECK:  Supple with a normal upstroke bilaterally.  No bruits noted.  There is no jugular venous distention.  I cannot appreciate thyromegaly.  CHEST:  Significant for decreased breath sounds throughout with mild  expiratory wheeze.  CARDIOVASCULAR:  Regular rate and rhythm with a normal S1 and S2.  I  cannot appreciate murmurs, rubs, or gallops.  ABDOMEN:  Nontender and nondistended.  Positive bowel sounds.  No  hepatosplenomegaly.  No mass appreciated.  There is no abdominal bruit.  She has 2+ femoral pulses bilaterally.  No bruits.  EXTREMITIES:  No edema.  I could palpate no cords.  She has 2+ dorsalis  pedis pulses bilaterally.  NEUROLOGIC:  Grossly intact.   LABORATORY DATA:  White blood cell count of 9.3 with a hemoglobin of  14.9, hematocrit of 43.2, and platelet count is 290.  Her sodium is 140  with potassium of 4.0.  Her BUN and creatinine are 17 and 0.9  respectively.  Her chest x-ray shows no acute abnormalities.  Her  electrocardiogram on admission showed an atrial fibrillation with a  rapid ventricular response at 160.  There are minor nonspecific ST   changes.   DIAGNOSES:  1. New diagnosis atrial fibrillation - the patient has atrial      fibrillation of unknown duration.  However, she did convert      spontaneously in sinus rhythm on a Cardizem drip immediately prior      to my evaluation.  We will therefore discontinue her IV Cardizem      instead treat with Cardizem CD p.o.  This will help control her      rate of atrial fibrillation recurs, and I think she will be at risk      for this given her history of lung disease.  We will also check a      TSH as well as an echocardiogram.  She has a CHADS2 score of 1 for      hypertension.  However, she would be at higher risk for an embolic      event given the fact that she is a female and is aged 71.  However,      she also has had recent significant epistaxis.  For now, we will      treat with aspirin 325 mg p.o. daily.  We can consider Coumadin in      the future if she has no further issues with epistaxis.  2. Hypertension - since we are adding Cardizem to her medical regimen,      we will discontinue her lisinopril, HCTZ, and doxazosin.  We can      advance her Cardizem as needed to control her blood pressure and      again, it will control her heart rate as well.  3. Chronic obstructive pulmonary disease- management per the Primary      Care Service.  4. Tobacco abuse - we discussed the importance of discontinuing this.  5. Epistaxis.   We will be happy to follow while she is in the hospital.      Madolyn Frieze. Jens Som, MD, [facility]  Electronically Signed     BSC/MEDQ  D:  11/29/2007  T:  11/30/2007  Job:  [id number]

## 2010-07-25 NOTE — Procedures (Signed)
[place]. [facility]  Patient:    [name], [name]                         MRN: [id number] Adm. Date:  [id number] Attending:  Sabino Gasser                           Procedure Report  PROCEDURE:  Colonoscopy.  INDICATIONS:  Hemoccult positivity.  ANESTHESIA:  An additional Demerol 25 mg, Versed 2.5 mg.  DESCRIPTION OF PROCEDURE:  With the patient mildly sedated in the left lateral decubitus position, the Olympus videoscopic colonoscope PCF-160 was inserted in the rectum and passed under direct vision to the cecum.  Cecum identified by the ileocecal valve and the appendiceal orifice, both of which were photographed.  From this point, the colonoscope was slowly withdrawn, taking circumferential views of the entire colonic mucosa, stopping only in the rectum, which appeared normal on direct view and showed internal hemorrhoids on retroflex view.  The endoscope was straightened and withdrawn.  The patients vital signs and pulse oximetry remained stable.  The patient tolerated the procedure well with no apparent complications.  FINDINGS:  Internal hemorrhoids, otherwise unremarkable examination.  See endoscopy note for further details of follow-up. DD:  02/09/00 TD:  02/09/00 Job: [id number]

## 2010-07-25 NOTE — Discharge Summary (Signed)
[place]. [facility]  Patient:    [name], [name] Visit Number: [id number] MRN: [id number]          Service Type: MED Location: MICU 2108 [id number] Attending Physician:  Michiel Sites Dictated by:   Janae Bridgeman Eloise Harman., M.D. Admit Date:  11/21/2000 Disc. Date: 11/25/00   CC:         Barbaraann Share, M.D. [facility]   Discharge Summary  DATE OF BIRTH:  05/31/1940.  FINAL DIAGNOSES:  1. Metabolic encephalopathy due to severe hyponatremia.  2. Acute and chronic alcoholism.  3. Transient seizure, probable due to effects of hyponatremia.  4. Hypertension.  5. Asthma.  6. Nicotine dependence.  7. Subacute and chronic pancreatitis.  8. History of hiatal hernia.  9. Hyperlipidemia. 10. Recurring tachycardia. 11. Situational depression.  HISTORY OF PRESENT ILLNESS:  This 70 year old white female was admitted by Dr. Michiel Sites in my absence.  She presented with a profound symptom complex of several days of nausea, vomiting, diarrhea, and weakness.  She developed some paresthesias in her arms and face and on the day of presentation to the emergency room, she was complaining of some chest pain. She had some seizure-like activity and disorientation.  On arrival at her home, EMS reported the patient to be allegedly alert, although not responsive to tactile and verbal stimulus.  She was found to have a blood sugar of 133 mg%.  She had an IV inserted, oxygen applied, and she was transported to the emergency room for further evaluation.  On arrival in the emergency room, her emergency ISTAT was performed and showed a respiratory alkalosis with pH of 7.58, pO2 65, bicarbonate of 36.  Her serum sodium was reported to be 103 mEq/L with a profoundly low potassium of 2.2, chloride of 60, and BUN of only 3.  Dr. Juleen China attended and admitted her to my service.  HOSPITAL COURSE:  The patient was placed in the intensive care unit.  She was initially treated  with hypertonic saline solution and then normal saline.  Her serum sodium gradually rose to a level of 134 and potassium with supplementation up to 4.5.  Her glucose remained within normal range, as well as her renal functions.  Her oxygen saturations remained stable on low-flow oxygen supplements.  Her liver functions were quite abnormal with ALT of 71, ALP of 119, total bilirubin 2.3, amylase 79, lipase low at 15.  Followup ALT was 127, ALP 111, total bilirubin 2.2.  Urine osmolality was suppressed at 374.  Her ammonia level was slightly elevated at 40.  T4 was 9.3, T3 uptake 44.5, TSH 2.112.  T3 by RIA was slightly elevated at 184.  Her plasma cortisol was 49.1 and ACTH was somewhat elevated at 23.  Her urine drug screen was negative.  Urinalysis showed yellow clear urine, specific gravity 1.014, pH 7, trace hemoglobin, 40 mg% of ketones, 1 unit of urobilinogen, few epithelial cells, and few bacteria.  Urine culture showed no growth.  Chest x-ray showed no active cardiopulmonary process.  A CT scan of the head was negative for any acute process.  An EKG showed normal sinus rhythm, with a rightward axis, prolonged QT interval with a lot of muscle artifact present on the tracing.  On the morning of discharge from the medical ICU, given the fact that there were no regular hospital beds for 48 hours following the order for discharge from the unit, the patient was assessed by physical therapy, was able  to ambulate around the unit with only minimal assistance.  She was eating and hungry, oriented.  She was completely amnesic for any and all events leading up to her hospitalization, including an office visit on November 19, 2000, having seen my physician assistant that day.  NOSOCOMIAL INFECTIONS:  None.  OPERATIONS:  None.  TRANSFUSIONS:  None.  CONSULTATIONS:  Critical care medicine with Dr. Marcelyn Bruins, et. al.  They attended her during the early phases of her hospitalization  and later this examiner reassumed care.  DISCHARGE INSTRUCTIONS:  MEDICATIONS: 1. She will continue on a multivitamin daily. 2. Avapro 300 mg once daily for her blood pressure. 3. Pancrease tablets three times daily for pancreatic supplementation. 4. Premarin 0.625 mg daily. 5. Provera 2.5 mg daily. 6. She will use her Albuterol inhaler as needed for any cough or wheezing. 7. She is encouraged to continue using a nicotine patch and to avoid    cigarette smoking completely. 8. A small number of generic Librium will be provided for agitation. 9. She will continue on some generic Zantac 150 mg daily over the next two    weeks.  DIET:  Regular.  Avoid all alcohol in any form.  FOLLOWUP:  She will follow up in the office for a routine visit in approximately three weeks and call as needed for any medical advice.  CONDITION ON DISCHARGE:  Greatly improved. Dictated by:   Janae Bridgeman Eloise Harman., M.D. Attending Physician:  Michiel Sites DD:  11/25/00 TD:  11/25/00 Job: [id number]

## 2010-07-25 NOTE — Op Note (Signed)
[place]. [facility]  Patient:    [name], [name]                         MRN: [id number] Proc. Date: 02/09/00 Adm. Date:  [id number] Attending:  Sabino Gasser                           Operative Report  PROCEDURE:  Upper endoscopy.  INDICATIONS:  Hemoccult positive stools.  ANESTHESIA:  Demerol 75 mg, Versed 7.5 mg.  PROCEDURE:  With the patient mildly sedated in the left lateral decubitus position the Olympus Videoscopic endoscope was inserted in the mouth and passed under direct vision through the esophagus which appeared normal into the stomach.  The fundus, body, antrum were well visualized.  The antrum showed changes of possible H. pylori gastritis.  This was photographed and biopsied and entered into the duodenal bulb and second portion of the duodenum both of which appeared normal and were photographed.  From this point the endoscope was slowly withdrawn taking circumferential views of the entire duodenal mucosa until the endoscope had been pulled back and the stomach placed in retroflexion to view the stomach from below and a hiatal hernia was visualized and photographed.  The endoscope was straightened, and withdrawn, taking circumferential views of the remaining gastric and esophageal mucosa which otherwise appeared normal. The patients vital signs and pulse oximeter remained stable.  The patient tolerated the procedure well without apparent complications.  FINDINGS:  Hiatal hernia, question of gastritis.  PLAN: Await biopsy report.  FOLLOWUP:  The patient will call me for results and follow up with me as an outpatient. DD:  02/09/00 TD:  02/09/00 Job: [id number]

## 2010-07-25 NOTE — H&P (Signed)
[place] ([place])  Patient:    [name], [name] Visit Number: [id number] MRN: [id number]          Service Type: MED Location: MICU 2108 [id number] Attending Physician:  Michiel Sites Dictated by:   [place], P.A.-C. Admit Date:  11/21/2000                           History and Physical  DATE OF BIRTH:  [birth year]  ADMITTING PHYSICIAN:  Janae Bridgeman. Eloise Harman., M.D.  CHIEF COMPLAINT:  A possible seizure.  HISTORY OF PRESENT ILLNESS:  I am unable to obtain a current history because the patient is sedated and intermittently agitated.  She is unable to answer any questions.  The history I am providing comes from previous office records and hospital records I have located.  Evidently, she was in the office on Friday with complaints of a three day history of mild nausea, vomiting, and diarrhea which had abated over the previous 12 hours.  She had had no symptoms on Friday.  Her stools have been normal without melena or hematochezia.  She was eating and drinking well on Friday.  She did complain that day of some tingling sensation in the forearms and cheeks bilaterally but no focal deficits.  She was also complaining of increased stress at that time, believing some of her symptoms were secondary to nerves from losing her job in January and being unable to find other employment.  She had been having trouble sleeping with frequent awakenings during the night.  She has also had slowly decreasing energy and appetite.  She admitted to being depressed and although she was begun on Paxil CR 12.5, she had not yet begun that medication.  She did admit to drinking excessively with at least 3-4 shots of vodka nightly.  Evidently, on Friday night, her husband reports giving her several beers.  They awoke during the night with some sort of seizure-like activity and did call 911.  In the ER, her sodium and potassium were low at 103 and 2.2, respectively.   Her head CAT scan was normal, and the patient was admitted for treatment.  CURRENT MEDICATIONS: 1. HCTZ 25 mg 1/2 q.d. 2. PremPro 0.625 mg q.d. 3. Albuterol p.r.n. 4. Cardura 4 mg 1/2 b.i.d. 5. Vicodin 5/500, 1-2 q.4h. as needed. 6. Singulair 10 mg q.d. 7. Diovan 160 mg q.d. 8. Pancrease 16,000 mg t.i.d. 9. Advair 50/100, 1 puff b.i.d.  Note, that she had been out of her Pancrease, PremPro, and Singulair for several weeks.  ALLERGIES:  ATENOLOL feels bad, CORGARD decreased libido, ALTACE headaches, PRINIVIL cough.  SOCIAL HISTORY:  She is married, smokes 2 packs per day, and drinks daily, including at least 3-4 vodka shots each night.  FAMILY HISTORY:  Limited.  Father is deceased and mother deceased with coronary artery disease.  PAST MEDICAL HISTORY: 1. Hypertension. 2. Hiatal hernia. 3. Hyperlipidemia. 4. Chronic pancreatitis. 5. COPD. 6. Asthma. 7. Recurrent tachycardia. 8. Alcohol and tobacco dependence.  PAST SURGICAL HISTORY: 1. Tonsillectomy. 2. Appendectomy. 3. Tubal ligation. 4. Right inguinal hernia repair.  PREVIOUS PROCEDURES: 1. Colonoscopy in December 2001, showing hemorrhoids. 2. Endoscopy December 2001, showing hiatal hernia.  LABORATORY VALUES:  Normal CBC, but comprehensive panel reveals low sodium at 103, low potassium at 2.2, low chloride at 60, low albumin at 3.1, total bilirubin elevated at 2.3, alkaline phosphatase elevated at 119, calcium decreased at 8.2, SGOT elevated  at 137, SGPT elevated at 71, total protein low at 5.8, magnesium low at 1.1.  Both amylase and lipase and normal at 79 and 15, respectively.  Urine drug screen is negative.  Blood alcohol was less than 5.  Head CT has not yet been dictated but per ER and Dr. Otilio Carpen note was negative.  PHYSICAL EXAMINATION:  VITAL SIGNS:  At noon, 116/68 with a mean pressure of 82, heart rate 110, respirations 31, O2 saturations 95% on room air.  GENERAL:  Very thin, frail-appearing,  white female who is resting supine with intermittent episodes of agitation.  She otherwise sedated and unable to answer questions.  She does appear to respond to painful stimuli but certainly unable to answer questions.  SKIN:  Warm and dry.  There is no rash.  HEENT:  Funduscopic exam is not completed, but pupils are equally round and reactive.  She is normocephalic, atraumatic and no evidence of head trauma.  NECK:  Supple without bruit.  CARDIOVASCULAR:  She is tachycardic with sinus rhythm.  There is no murmur, rub or gallop.  LUNGS:  Faint end expiratory wheezes.  There are no rhonchi or rales appreciated.  ABDOMEN:  Soft.  Bowel sounds are present.  She is somewhat rigid with excessive striae.  There is no abdominal mass or organomegaly.  RECTAL/GENITOURINARY:  Deferred.  EXTREMITIES:  No edema.  NEUROLOGIC:  Unable to complete this exam.  She is agitated and has been recently sedated with extra Ativan to prevent DTs or potential seizures.  ASSESSMENT:  1. Possible seizure.  2. Recent history of nausea, vomiting, and diarrhea.  3. Hypokalemia.  4. Hyponatremia.  5. Alcohol dependence and probable delirium tremens.  6. Tobacco dependence, 2 packs per day.  7. Chronic pancreatitis, recently out of Pancrease for several weeks.  8. Hypertension.  9. Hiatal hernia. 10. Hyperlipidemia. 11. Chronic obstructive pulmonary disease and asthma in a chronic smoker. 12. Recurrent tachycardia. 13. Previous surgeries.     a. Tonsillectomy.     b. Appendectomy.     c. Tubal ligation.     d. Right inguinal hernia repair.  PLAN:  Admit to Dr. Lendell Caprice.  Will begin IVs to correct electrolyte imbalance and monitor her electrolytes closely.  Will place her on monitor, initiate seizure precautions, and detox protocol.  Will begin p.o. liquids when she is able and also continue p.o. medications when she is able.  If she is not improving within 24 hours, will need to use IV  medications for this.  Portable  x-ray was completed this morning and still pending.  Currently, she has desaturated.  We have placed a nasal cannula with 2 liters of 02 and are currently going to order an arterial blood gas. Dictated by:   [place], P.A.-C. Attending Physician:  Michiel Sites DD:  11/21/00 TD:  11/21/00 Job: [phone]

## 2010-07-25 NOTE — Op Note (Signed)
[place]. [facility]  Patient:    JASZMINE, NAVEJAS                         MRN: [id number] Proc. Date: 02/09/00 Adm. Date:  [id number] Attending:  Sabino Gasser                           Operative Report  PROCEDURE PERFORMED:  Upper endoscopy.  ENDOSCOPIST:  Sabino Gasser, M.D.  INDICATIONS FOR PROCEDURE:  Heme positive stools.  ANESTHESIA:  Demerol 75 mg, Versed 7.5 mg.  DESCRIPTION OF PROCEDURE:  With the patient mildly sedated in the left lateral decubitus position, the Olympus video endoscope was inserted in the mouth and passed under direct vision through the esophagus, which appeared normal, into the stomach.  The fundus, body and antrum were well visualized.  The antrum showed changes of possible Helicobacter pylori gastritis.  This was photographed and biopsied.  We entered into the duodenal bulb and second portion of the duodenum, both of which appeared normal and were photographed. From this point, the endoscope was slowly withdrawn taking circumferential views of the entire duodenal mucosa until the endoscope had been pulled back into the stomach and placed on retroflexion to view the stomach from below and a hiatal hernia was visualized and photographed.  The endoscope was then straightened and withdrawn taking circumferential views of the entire gastric and esophageal mucosa, which otherwise appeared normal.  Patients vital signs and pulse oximeter remained stable.  The patient tolerated the procedure well without apparent complications.  FINDINGS:  Hiatal hernia.  Question of gastritis, await biopsy report.  The patient will call me for results and follow up with me as an outpatient.  PLAN: DD:  02/09/00 TD:  02/09/00 Job: [id number]

## 2010-08-06 ENCOUNTER — Ambulatory Visit (INDEPENDENT_AMBULATORY_CARE_PROVIDER_SITE_OTHER): Payer: Self-pay | Admitting: *Deleted

## 2010-08-06 DIAGNOSIS — Z7901 Long term (current) use of anticoagulants: Secondary | ICD-10-CM

## 2010-08-06 DIAGNOSIS — I4891 Unspecified atrial fibrillation: Secondary | ICD-10-CM

## 2010-08-11 ENCOUNTER — Telehealth: Payer: Self-pay | Admitting: *Deleted

## 2010-08-11 DIAGNOSIS — I10 Essential (primary) hypertension: Secondary | ICD-10-CM

## 2010-08-11 MED ORDER — DILTIAZEM HCL 90 MG PO TABS: 90.0000 mg | ORAL_TABLET | Freq: Four times a day (QID) | ORAL | Status: DC

## 2010-08-11 NOTE — Telephone Encounter (Signed)
Will call patient to advises she needs to schedule appointment for follow up. Line is busy at present time.

## 2010-08-14 ENCOUNTER — Other Ambulatory Visit: Payer: Self-pay | Admitting: Family Medicine

## 2010-08-14 MED ORDER — ALBUTEROL SULFATE HFA 108 (90 BASE) MCG/ACT IN AERS: 2.0000 | INHALATION_SPRAY | RESPIRATORY_TRACT | Status: DC | PRN

## 2010-09-03 ENCOUNTER — Ambulatory Visit (INDEPENDENT_AMBULATORY_CARE_PROVIDER_SITE_OTHER): Payer: Self-pay | Admitting: *Deleted

## 2010-09-03 DIAGNOSIS — I4891 Unspecified atrial fibrillation: Secondary | ICD-10-CM

## 2010-09-03 DIAGNOSIS — Z7901 Long term (current) use of anticoagulants: Secondary | ICD-10-CM

## 2010-09-03 LAB — POCT INR: INR: 2.2

## 2010-10-01 ENCOUNTER — Ambulatory Visit (INDEPENDENT_AMBULATORY_CARE_PROVIDER_SITE_OTHER): Payer: Medicare Other | Admitting: *Deleted

## 2010-10-01 ENCOUNTER — Ambulatory Visit (INDEPENDENT_AMBULATORY_CARE_PROVIDER_SITE_OTHER): Payer: Medicare Other | Admitting: Family Medicine

## 2010-10-01 VITALS — BP 168/81 | HR 94 | Temp 98.4°F | Ht 63.0 in | Wt 96.0 lb

## 2010-10-01 DIAGNOSIS — I4891 Unspecified atrial fibrillation: Secondary | ICD-10-CM

## 2010-10-01 DIAGNOSIS — I10 Essential (primary) hypertension: Secondary | ICD-10-CM

## 2010-10-01 DIAGNOSIS — Z7901 Long term (current) use of anticoagulants: Secondary | ICD-10-CM

## 2010-10-01 DIAGNOSIS — F17200 Nicotine dependence, unspecified, uncomplicated: Secondary | ICD-10-CM

## 2010-10-01 LAB — LDL CHOLESTEROL, DIRECT: Direct LDL: 115 mg/dL — ABNORMAL HIGH

## 2010-10-01 MED ORDER — WARFARIN SODIUM 5 MG PO TABS: 5.0000 mg | ORAL_TABLET | ORAL | Status: DC

## 2010-10-01 MED ORDER — HYDROCHLOROTHIAZIDE 12.5 MG PO CAPS: 12.5000 mg | ORAL_CAPSULE | Freq: Every day | ORAL | Status: DC

## 2010-10-01 MED ORDER — DILTIAZEM HCL 90 MG PO TABS: 90.0000 mg | ORAL_TABLET | Freq: Four times a day (QID) | ORAL | Status: DC

## 2010-10-01 MED ORDER — FLUTICASONE-SALMETEROL 500-50 MCG/DOSE IN AEPB: 1.0000 | INHALATION_SPRAY | Freq: Two times a day (BID) | RESPIRATORY_TRACT | Status: DC

## 2010-10-01 MED ORDER — LISINOPRIL 10 MG PO TABS: 10.0000 mg | ORAL_TABLET | Freq: Every day | ORAL | Status: DC

## 2010-10-01 NOTE — Patient Instructions (Signed)
Pain at right iliosacral joint: Tylenol 650mg  by mouth 2 x day as needed. Blood pressure: Check 1 x per week for the next 3-4 weeks.  Tobacco: Consider making an appointment with Dr. Raymondo Band to learn more about smoking cessation medications/products.  1-800-quit-now. Return for blood pressure recheck in 26month.

## 2010-10-02 ENCOUNTER — Encounter: Payer: Self-pay | Admitting: Family Medicine

## 2010-10-02 LAB — COMPREHENSIVE METABOLIC PANEL
Albumin: 4.7 g/dL (ref 3.5–5.2)
BUN: 13 mg/dL (ref 6–23)
CO2: 27 mEq/L (ref 19–32)
Calcium: 10.4 mg/dL (ref 8.4–10.5)
Chloride: 98 mEq/L (ref 96–112)
Creat: 0.81 mg/dL (ref 0.50–1.10)
Potassium: 4.6 mEq/L (ref 3.5–5.3)

## 2010-10-04 ENCOUNTER — Encounter: Payer: Self-pay | Admitting: Family Medicine

## 2010-10-04 DIAGNOSIS — M2559 Pain in other specified joint: Secondary | ICD-10-CM | POA: Insufficient documentation

## 2010-10-04 NOTE — Assessment & Plan Note (Signed)
On lisinopril 10mg  and hctz 12.5mg  daily.  sbp of 160 on arrival, improved to 140 at end of visit.  Pt to check blood pressure and keep log book x 3 weeks. Then return for follow up visit. If consistent elevation will increase hctz.

## 2010-10-04 NOTE — Assessment & Plan Note (Signed)
Pt to call and make appt with Dr. Raymondo Band for smoking cessation class.

## 2010-10-04 NOTE — Progress Notes (Signed)
  Subjective:    Patient ID: [name], female    DOB: 1940/08/30, 70 y.o.   MRN: [id number]  HPI Needs refills: Pt states she needs refills on some of her medications and would like these refilled today.   Hypertension: BP elevated on arrival to 168/81.  On recheck bp 140/80.  Pt doesn't check bp at home.   Smoking cessation: States that she would like to stop smoking and she feels that she is now ready.  Would like to set a start date but feels like she will need some type of nicotine replacement to help her.  Has questions about the different forms of nicotine replacement.   Back pain: Right lower back pain.  Pain in one spot in right lower back.  No radiation down legs.  No pain in spine.  Pain increases with movement, bending, lifting.  Improves with rest.  Occurs off and on a few times per week.  No fever. No bowel or bladder problems.   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    Review of Systems No weight loss.  No increased cough.  No fever.  No dizziness.  No cp.     Objective:   Physical Exam  Constitutional: She is oriented to person, place, and time. She appears well-developed and well-nourished.  HENT:  Head: Normocephalic and atraumatic.  Neck: Normal range of motion.  Cardiovascular: Normal rate and regular rhythm.   No murmur heard. Pulmonary/Chest: Effort normal and breath sounds normal. No respiratory distress.  Abdominal: Soft. She exhibits no distension. There is no tenderness.  Musculoskeletal: Normal range of motion. She exhibits no edema.  Neurological: She is alert and oriented to person, place, and time.  Skin: No rash noted.  back exam: pain with palpation of right SI joint.  No pain with palp of spinous process or paraspinal muscles.  Sensation intact bilateral.  Movement 5/5 bilateral in lower ext.         Assessment & Plan:   No problem-specific  assessment & plan notes found for this encounter.

## 2010-10-04 NOTE — Assessment & Plan Note (Signed)
Pain in right lower back most likely is SI joint pain.  Tylenol bid scheduled x 1-2 weeks.  Then prn.  Pt to return if any new or worsening of symptoms

## 2010-10-29 ENCOUNTER — Ambulatory Visit (INDEPENDENT_AMBULATORY_CARE_PROVIDER_SITE_OTHER): Payer: Medicare Other | Admitting: *Deleted

## 2010-10-29 DIAGNOSIS — I4891 Unspecified atrial fibrillation: Secondary | ICD-10-CM

## 2010-10-29 DIAGNOSIS — Z7901 Long term (current) use of anticoagulants: Secondary | ICD-10-CM

## 2010-11-26 ENCOUNTER — Ambulatory Visit (INDEPENDENT_AMBULATORY_CARE_PROVIDER_SITE_OTHER): Payer: Medicare Other | Admitting: *Deleted

## 2010-11-26 DIAGNOSIS — I4891 Unspecified atrial fibrillation: Secondary | ICD-10-CM

## 2010-11-26 DIAGNOSIS — Z7901 Long term (current) use of anticoagulants: Secondary | ICD-10-CM

## 2010-11-26 LAB — POCT INR: INR: 2.5

## 2010-12-08 LAB — DIFFERENTIAL
Basophils Relative: 2 — ABNORMAL HIGH
Eosinophils Absolute: 0.2
Eosinophils Relative: 2
Monocytes Absolute: 0.8
Monocytes Relative: 8
Neutro Abs: 4.7

## 2010-12-08 LAB — CBC
HCT: 43.2
MCV: 94.6
Platelets: 290
RDW: 12.6
WBC: 9.3

## 2010-12-08 LAB — COMPREHENSIVE METABOLIC PANEL
ALT: 18
AST: 26
Albumin: 4.3
Alkaline Phosphatase: 103
Chloride: 104
GFR calc Af Amer: >60
Potassium: 4
Sodium: 141
Total Protein: 7

## 2010-12-08 LAB — TSH: TSH: 2.91

## 2010-12-08 LAB — POCT CARDIAC MARKERS: Troponin i, poc: <0.05

## 2010-12-08 LAB — APTT: aPTT: 28

## 2010-12-12 LAB — BASIC METABOLIC PANEL
BUN: 12
GFR calc Af Amer: >60
GFR calc non Af Amer: >60
Potassium: 4

## 2010-12-12 LAB — DIFFERENTIAL
Basophils Absolute: 0
Lymphocytes Relative: 14
Neutro Abs: 6
Neutrophils Relative %: 73

## 2010-12-12 LAB — BLOOD GAS, ARTERIAL
Drawn by: 283371
FIO2: 0.21
pCO2 arterial: 38.6
pO2, Arterial: 59.3 — ABNORMAL LOW

## 2010-12-12 LAB — CBC
HCT: 44.6
Platelets: 264
WBC: 8.3

## 2010-12-24 ENCOUNTER — Ambulatory Visit (INDEPENDENT_AMBULATORY_CARE_PROVIDER_SITE_OTHER): Payer: Medicare Other | Admitting: *Deleted

## 2010-12-24 DIAGNOSIS — I4891 Unspecified atrial fibrillation: Secondary | ICD-10-CM

## 2010-12-24 DIAGNOSIS — Z7901 Long term (current) use of anticoagulants: Secondary | ICD-10-CM

## 2010-12-26 ENCOUNTER — Ambulatory Visit (INDEPENDENT_AMBULATORY_CARE_PROVIDER_SITE_OTHER): Payer: Medicare Other | Admitting: *Deleted

## 2010-12-26 DIAGNOSIS — Z23 Encounter for immunization: Secondary | ICD-10-CM

## 2011-01-31 ENCOUNTER — Other Ambulatory Visit: Payer: Self-pay | Admitting: Family Medicine

## 2011-01-31 MED ORDER — FLUTICASONE-SALMETEROL 500-50 MCG/DOSE IN AEPB: 1.0000 | INHALATION_SPRAY | Freq: Two times a day (BID) | RESPIRATORY_TRACT | Status: DC

## 2011-02-04 ENCOUNTER — Ambulatory Visit (INDEPENDENT_AMBULATORY_CARE_PROVIDER_SITE_OTHER): Payer: Medicare Other | Admitting: *Deleted

## 2011-02-04 DIAGNOSIS — Z7901 Long term (current) use of anticoagulants: Secondary | ICD-10-CM

## 2011-02-04 DIAGNOSIS — I4891 Unspecified atrial fibrillation: Secondary | ICD-10-CM

## 2011-02-04 LAB — POCT INR: INR: 2.7

## 2011-03-05 ENCOUNTER — Telehealth: Payer: Self-pay | Admitting: Family Medicine

## 2011-03-05 NOTE — Telephone Encounter (Signed)
Will fwd. To PCP for refill. .[name]  

## 2011-03-05 NOTE — Telephone Encounter (Signed)
[name] needs a refill for Advair [facility]  The phone # there is [phone].  Attention:  Diane.

## 2011-03-05 NOTE — Telephone Encounter (Signed)
This was refilled in November and sent to [facility] pharmacy on [address].  Pt needs to call [facility] pharmacy and ask them to transfer the prescription.  Can you please call pt and let her know this. Thanks, [employer]

## 2011-03-18 ENCOUNTER — Ambulatory Visit (INDEPENDENT_AMBULATORY_CARE_PROVIDER_SITE_OTHER): Payer: Medicare Other | Admitting: *Deleted

## 2011-03-18 DIAGNOSIS — I4891 Unspecified atrial fibrillation: Secondary | ICD-10-CM

## 2011-03-18 DIAGNOSIS — Z7901 Long term (current) use of anticoagulants: Secondary | ICD-10-CM

## 2011-03-18 LAB — POCT INR: INR: 3

## 2011-03-20 ENCOUNTER — Other Ambulatory Visit: Payer: Self-pay | Admitting: Family Medicine

## 2011-03-20 MED ORDER — FLUTICASONE-SALMETEROL 500-50 MCG/DOSE IN AEPB: 1.0000 | INHALATION_SPRAY | Freq: Two times a day (BID) | RESPIRATORY_TRACT | Status: DC

## 2011-03-31 ENCOUNTER — Ambulatory Visit (INDEPENDENT_AMBULATORY_CARE_PROVIDER_SITE_OTHER): Payer: Medicare Other | Admitting: Family Medicine

## 2011-03-31 ENCOUNTER — Encounter: Payer: Self-pay | Admitting: Family Medicine

## 2011-03-31 VITALS — BP 155/90 | HR 97 | Temp 98.2°F | Ht 63.0 in | Wt 98.0 lb

## 2011-03-31 DIAGNOSIS — J449 Chronic obstructive pulmonary disease, unspecified: Secondary | ICD-10-CM

## 2011-03-31 DIAGNOSIS — J441 Chronic obstructive pulmonary disease with (acute) exacerbation: Secondary | ICD-10-CM | POA: Insufficient documentation

## 2011-03-31 MED ORDER — IPRATROPIUM BROMIDE 0.02 % IN SOLN
0.5000 mg | Freq: Once | RESPIRATORY_TRACT | Status: AC
  Administered 2011-03-31: 0.5 mg via RESPIRATORY_TRACT

## 2011-03-31 MED ORDER — ALBUTEROL SULFATE (2.5 MG/3ML) 0.083% IN NEBU
2.5000 mg | INHALATION_SOLUTION | Freq: Once | RESPIRATORY_TRACT | Status: AC
  Administered 2011-03-31: 2.5 mg via RESPIRATORY_TRACT

## 2011-03-31 MED ORDER — ALBUTEROL SULFATE HFA 108 (90 BASE) MCG/ACT IN AERS: 2.0000 | INHALATION_SPRAY | RESPIRATORY_TRACT | Status: DC | PRN

## 2011-03-31 MED ORDER — IPRATROPIUM BROMIDE 0.02 % IN SOLN: 500.0000 ug | Freq: Four times a day (QID) | RESPIRATORY_TRACT | Status: DC

## 2011-03-31 MED ORDER — ALBUTEROL SULFATE (2.5 MG/3ML) 0.083% IN NEBU: 2.5000 mg | INHALATION_SOLUTION | Freq: Four times a day (QID) | RESPIRATORY_TRACT | Status: DC | PRN

## 2011-03-31 MED ORDER — IPRATROPIUM BROMIDE HFA 17 MCG/ACT IN AERS: 2.0000 | INHALATION_SPRAY | Freq: Four times a day (QID) | RESPIRATORY_TRACT | Status: DC

## 2011-03-31 MED ORDER — PREDNISONE 50 MG PO TABS: 50.0000 mg | ORAL_TABLET | Freq: Every day | ORAL | Status: DC

## 2011-03-31 NOTE — Assessment & Plan Note (Signed)
With exacerbation today. As discussed below. However she does not have a nebulizer at home and think she may benefit from it. Therefore all prescribed a nebulizer and Atrovent and albuterol nebulizer treatments.

## 2011-03-31 NOTE — Assessment & Plan Note (Signed)
Patient with COPD exacerbation today. Treated in the office with Atrovent and albuterol. Plan to send home with prednisone 50 for 5 days and albuterol and Atrovent inhalers. Additionally we'll use nebulizer treatments as needed at home.  I'm not prescribing antibiotics today as I feel the benefit of increasing the time to next exacerbation is palpated by the risk of interfering with her INR levels.  She agrees with this plan. We'll followup as needed.  Red flags reviewed with patient who expresses understanding handout on COPD provided to patient.

## 2011-03-31 NOTE — Patient Instructions (Addendum)
Thank you for coming in today. Use the prednisone daily for 5 days.  Use the albuterol and atrovent as needed for shortness of breath.  Return if you have a fever or cannot catch your breath.    Chronic Obstructive Pulmonary Disease Chronic obstructive pulmonary disease (COPD) is a condition in which airflow from the lungs is restricted. The lungs can never return to normal, but there are measures you can take which will improve them and make you feel better. CAUSES    Smoking.     Exposure to secondhand smoke.     Breathing in irritants (pollution, cigarette smoke, strong smells, aerosol sprays, paint fumes).     History of lung infections.  TREATMENT   Treatment focuses on making you comfortable (supportive care). Your caregiver may prescribe medications (inhaled or pills) to help improve your breathing. HOME CARE INSTRUCTIONS    If you smoke, stop smoking.   Avoid exposure to smoke, chemicals, and fumes that aggravate your breathing.     Take antibiotic medicines as directed by your caregiver.     Avoid medicines that dry up your system and slow down the elimination of secretions (antihistamines and cough syrups). This decreases respiratory capacity and may lead to infections.     Drink enough water and fluids to keep your urine clear or pale yellow. This loosens secretions.     Use humidifiers at home and at your bedside if they do not make breathing difficult.     Receive all protective vaccines your caregiver suggests, especially pneumococcal and influenza.     Use home oxygen as suggested.     Stay active. Exercise and physical activity will help maintain your ability to do things you want to do.     Eat a healthy diet.  SEEK MEDICAL CARE IF:    You develop pus-like mucus (sputum).     Breathing is more labored or exercise becomes difficult to do.     You are running out of the medicine you take for your breathing.  SEEK IMMEDIATE MEDICAL CARE IF:    You have a  rapid heart rate.     You have agitation, confusion, tremors, or are in a stupor (family members may need to observe this).     It becomes difficult to breathe.     You develop chest pain.     You have a fever.  MAKE SURE YOU:    Understand these instructions.     Will watch your condition.     Will get help right away if you are not doing well or get worse.  Document Released: 12/03/2004 Document Revised: 11/05/2010 Document Reviewed: 04/25/2010 [facility] Patient Information 2012 [place], [place].

## 2011-03-31 NOTE — Progress Notes (Signed)
[name] is a 71 year old female who presents today with a COPD exacerbation for about 2 weeks.  She has chronic COPD with frequent exacerbations. She describes wheezing and increased shortness of breath with exertion.  She denies any fevers or chills and says this is consistent with prior COPD exacerbations.  She has been using Advair and albuterol as needed. She does note that she had a period of about 2 weeks where she was without Advair and thinks this may have led up to her COPD exacerbation.   PMH reviewed.  ROS as above otherwise neg Medications reviewed. Current Outpatient Prescriptions  Medication Sig Dispense Refill  . albuterol (PROVENTIL HFA) 108 (90 BASE) MCG/ACT inhaler Inhale 2 puffs into the lungs every 4 (four) hours as needed for wheezing. Inhale 2 puff using inhaler every four hours  1 Inhaler  4  . albuterol (PROVENTIL) (2.5 MG/3ML) 0.083% nebulizer solution Take 3 mLs (2.5 mg total) by nebulization every 6 (six) hours as needed for wheezing.  75 mL  12  . diltiazem (CARDIZEM) 90 MG tablet Take 1 tablet (90 mg total) by mouth 4 (four) times daily.  120 tablet  6  . Fluticasone-Salmeterol (ADVAIR DISKUS) 500-50 MCG/DOSE AEPB Inhale 1 puff into the lungs 2 (two) times daily.  60 each  8  . hydrochlorothiazide (,MICROZIDE/HYDRODIURIL,) 12.5 MG capsule Take 1 capsule (12.5 mg total) by mouth daily. For high blood pressure  30 capsule  6  . ipratropium (ATROVENT HFA) 17 MCG/ACT inhaler Inhale 2 puffs into the lungs every 6 (six) hours.  1 Inhaler  12  . ipratropium (ATROVENT) 0.02 % nebulizer solution Take 2.5 mLs (500 mcg total) by nebulization 4 (four) times daily.  75 mL  12  . lisinopril (PRINIVIL,ZESTRIL) 10 MG tablet Take 1 tablet (10 mg total) by mouth daily.  30 tablet  6  . predniSONE (DELTASONE) 50 MG tablet Take 1 tablet (50 mg total) by mouth daily.  5 tablet  0  . warfarin (COUMADIN) 5 MG tablet Take 1 tablet (5 mg total) by mouth as directed. Take as directed by  coumadin clinic.  60 tablet  6  . DISCONTD: albuterol (PROVENTIL HFA) 108 (90 BASE) MCG/ACT inhaler Inhale 2 puffs into the lungs every 4 (four) hours as needed for wheezing. Inhale 2 puff using inhaler every four hours       Current Facility-Administered Medications  Medication Dose Route Frequency Provider Last Rate Last Dose  . albuterol (PROVENTIL) (2.5 MG/3ML) 0.083% nebulizer solution 2.5 mg  2.5 mg Nebulization Once Clementeen Graham, MD      . ipratropium (ATROVENT) nebulizer solution 0.5 mg  0.5 mg Nebulization Once Clementeen Graham, MD        Exam:  BP 155/90  Pulse 97  Temp(Src) 98.2 F (36.8 C) (Oral)  Ht 5\' 3"  (1.6 m)  Wt 98 lb (44.453 kg)  BMI 17.36 kg/m2  SpO2 90% Gen: Well NAD HEENT: EOMI,  MMM Lungs: Normal work of breathing. Lungs bilaterally have prolonged expiratory phase with wheezes in all fields on expiration. Heart: RRR no MRG Abd: NABS, NT, ND Exts: Non edematous BL  LE, warm and well perfused.   Following albuterol and Atrovent nebulizer treatment her lungs became clear and she felt better.

## 2011-04-27 ENCOUNTER — Telehealth: Payer: Self-pay | Admitting: Family Medicine

## 2011-04-27 NOTE — Telephone Encounter (Signed)
Rep from [employer] left form on Mrs. Ong to be completed for delivery of nebulizer.  Please call Ms. Katrinka Blazing the when form completed.

## 2011-04-27 NOTE — Telephone Encounter (Signed)
[employer] form signed.  Office visit from 03/31/11 printed.  Message left for [facility] @ [phone] that form is ready to be picked up at front desk.   Ileana Ladd

## 2011-04-27 NOTE — Telephone Encounter (Signed)
[employer] [facility] form given to Dr Denyse Amass for signature.  Ileana Ladd

## 2011-04-29 ENCOUNTER — Ambulatory Visit (INDEPENDENT_AMBULATORY_CARE_PROVIDER_SITE_OTHER): Payer: Medicare Other | Admitting: Pharmacist

## 2011-04-29 DIAGNOSIS — Z7901 Long term (current) use of anticoagulants: Secondary | ICD-10-CM

## 2011-04-29 DIAGNOSIS — I4891 Unspecified atrial fibrillation: Secondary | ICD-10-CM

## 2011-05-14 ENCOUNTER — Telehealth: Payer: Self-pay | Admitting: Family Medicine

## 2011-05-14 ENCOUNTER — Other Ambulatory Visit: Payer: Self-pay | Admitting: Family Medicine

## 2011-05-14 DIAGNOSIS — I10 Essential (primary) hypertension: Secondary | ICD-10-CM

## 2011-05-14 MED ORDER — LISINOPRIL 10 MG PO TABS: 10.0000 mg | ORAL_TABLET | Freq: Every day | ORAL | Status: DC

## 2011-05-14 MED ORDER — HYDROCHLOROTHIAZIDE 12.5 MG PO CAPS: 12.5000 mg | ORAL_CAPSULE | Freq: Every day | ORAL | Status: DC

## 2011-05-14 MED ORDER — DILTIAZEM HCL 90 MG PO TABS: 90.0000 mg | ORAL_TABLET | Freq: Four times a day (QID) | ORAL | Status: DC

## 2011-05-14 NOTE — Telephone Encounter (Signed)
Needs refills on HCTZ, Lisinopril and Cardizem to be sent to [facility] on [address].  She did call them first, but since she doesn't have any refill, a new Rx needs to be sent.  She is out of all of these meds.

## 2011-05-14 NOTE — Telephone Encounter (Signed)
Please let pt know that I have given 1 refill, but needs to come in for follow up appointment before further refills.

## 2011-05-14 NOTE — Telephone Encounter (Signed)
Will fwd. To PCP for refills. .Ludie Hudon  

## 2011-06-10 ENCOUNTER — Ambulatory Visit (INDEPENDENT_AMBULATORY_CARE_PROVIDER_SITE_OTHER): Payer: Medicare Other | Admitting: Pharmacist

## 2011-06-10 DIAGNOSIS — I4891 Unspecified atrial fibrillation: Secondary | ICD-10-CM

## 2011-06-10 DIAGNOSIS — Z7901 Long term (current) use of anticoagulants: Secondary | ICD-10-CM

## 2011-07-14 ENCOUNTER — Other Ambulatory Visit: Payer: Self-pay | Admitting: Family Medicine

## 2011-07-14 MED ORDER — HYDROCHLOROTHIAZIDE 12.5 MG PO CAPS: 12.5000 mg | ORAL_CAPSULE | Freq: Every day | ORAL | Status: DC

## 2011-07-14 MED ORDER — LISINOPRIL 10 MG PO TABS: 10.0000 mg | ORAL_TABLET | Freq: Every day | ORAL | Status: DC

## 2011-07-16 ENCOUNTER — Telehealth: Payer: Self-pay | Admitting: Family Medicine

## 2011-07-16 ENCOUNTER — Other Ambulatory Visit: Payer: Self-pay | Admitting: Family Medicine

## 2011-07-16 DIAGNOSIS — I10 Essential (primary) hypertension: Secondary | ICD-10-CM

## 2011-07-16 MED ORDER — DILTIAZEM HCL 90 MG PO TABS: 90.0000 mg | ORAL_TABLET | Freq: Four times a day (QID) | ORAL | Status: DC

## 2011-07-16 NOTE — Telephone Encounter (Signed)
Will route request to Dr. Edmonia James.  Gaylene Brooks, RN

## 2011-07-16 NOTE — Telephone Encounter (Signed)
Pt called to say she is out of her diltiazem (CARDIZEM) 90 MG tablet She has an appt on 5/14 and needs enough until then  [facility]

## 2011-07-16 NOTE — Telephone Encounter (Signed)
Please see my telephone note from 05/2011- told her at that time I would give her a refill to get her through to her f/up appointment so we can do monitoring labs.  Pt now requesting further refills.  Will give another refill to get her through until next appointment since appt is scheduled for April.  Must evaluate pt, and obtain labwork, for pt safety before giving refill again.

## 2011-07-21 ENCOUNTER — Ambulatory Visit (INDEPENDENT_AMBULATORY_CARE_PROVIDER_SITE_OTHER): Payer: Medicare Other | Admitting: Family Medicine

## 2011-07-21 ENCOUNTER — Encounter: Payer: Self-pay | Admitting: Family Medicine

## 2011-07-21 VITALS — BP 133/83 | HR 86 | Ht 63.0 in | Wt 96.0 lb

## 2011-07-21 DIAGNOSIS — F17200 Nicotine dependence, unspecified, uncomplicated: Secondary | ICD-10-CM

## 2011-07-21 DIAGNOSIS — J441 Chronic obstructive pulmonary disease with (acute) exacerbation: Secondary | ICD-10-CM

## 2011-07-21 DIAGNOSIS — E785 Hyperlipidemia, unspecified: Secondary | ICD-10-CM

## 2011-07-21 DIAGNOSIS — M720 Palmar fascial fibromatosis [Dupuytren]: Secondary | ICD-10-CM

## 2011-07-21 DIAGNOSIS — I10 Essential (primary) hypertension: Secondary | ICD-10-CM

## 2011-07-21 DIAGNOSIS — I4891 Unspecified atrial fibrillation: Secondary | ICD-10-CM

## 2011-07-21 DIAGNOSIS — J449 Chronic obstructive pulmonary disease, unspecified: Secondary | ICD-10-CM

## 2011-07-21 MED ORDER — HYDROCHLOROTHIAZIDE 12.5 MG PO CAPS: 12.5000 mg | ORAL_CAPSULE | Freq: Every day | ORAL | Status: DC

## 2011-07-21 MED ORDER — BLOOD PRESSURE MONITOR/WRIST DEVI: 1.0000 | Freq: Once | Status: DC

## 2011-07-21 MED ORDER — LISINOPRIL 10 MG PO TABS: 10.0000 mg | ORAL_TABLET | Freq: Every day | ORAL | Status: DC

## 2011-07-21 MED ORDER — FLUTICASONE-SALMETEROL 500-50 MCG/DOSE IN AEPB: 1.0000 | INHALATION_SPRAY | Freq: Two times a day (BID) | RESPIRATORY_TRACT | Status: DC

## 2011-07-21 MED ORDER — ALBUTEROL SULFATE HFA 108 (90 BASE) MCG/ACT IN AERS: 2.0000 | INHALATION_SPRAY | RESPIRATORY_TRACT | Status: DC | PRN

## 2011-07-21 MED ORDER — WARFARIN SODIUM 5 MG PO TABS: 5.0000 mg | ORAL_TABLET | ORAL | Status: DC

## 2011-07-21 MED ORDER — IPRATROPIUM BROMIDE HFA 17 MCG/ACT IN AERS: 2.0000 | INHALATION_SPRAY | Freq: Four times a day (QID) | RESPIRATORY_TRACT | Status: DC

## 2011-07-21 MED ORDER — DILTIAZEM HCL 90 MG PO TABS: 90.0000 mg | ORAL_TABLET | Freq: Four times a day (QID) | ORAL | Status: DC

## 2011-07-21 NOTE — Patient Instructions (Addendum)
Blood pressure: Goal blood pressure is less than 140/90.  Your blood pressure today is 133/83 I have sent an rx for a blood pressure machine to the pharmacy. Check your blood pressure 1-2 x per week. If consistently higher than our goal come back for recheck.   Labs: I will send the results in the mail.   Smoking cessation: Let me know how I can help you.   Hand contracture: I think it is reasonable to see an orthopedic doctor for this.  Call to schedule, if you need a referral let me know what doctor you want to see.   Pt to return in September for annual physical exam- to review health maintenance items.

## 2011-07-21 NOTE — Progress Notes (Addendum)
  Subjective:    Patient ID: [name], female    DOB: [birth year], 71 y.o.   MRN: [id number]  HPI Patient here for followup for medication refills:  Atrial fibrillation: Patient sees Dr. Jens Som yearly. Diltiazem as prescribed by family practice. Has not had liver enzymes in over one year. This needs to be monitored in order to prescribe diltiazem. Heart rate 86 today. No problems with atrial fibrillation per patient. No controlled on Coumadin which is monitored at cardiology office. Tachycardia. No syncope.  Hypertension: Blood pressure 133/83 today. Does not check blood pressure at home. States the blood pressure cuff is broken. Unsure if insurance will cover new cuff. Takes medication HCTZ and lisinopril as directed. No dizziness. No CP. No changes in vision.  Smoking cessation: Patient states that she rolls her own cigarettes. Smokes one pack per day. Does not feel she can quit at this time. Doesn't feel that anyone can help her-think she needs to learn to do this on her own. Currently does not feel she can quit.  Depression screening: Patient states that she does not have any signs or symptoms of depression. Does not feel down. 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 Does not have any problems with depression. Denies history of depression.  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 Cannot straighten out fifth and fourth finger. This does not her period but it does bother her. Would like to talk to orthopedic surgeon about correcting this. Patient's concern is the continued it worse. Has been present since fall 2012 and is getting worse by the month. Has not had a history of these contractures. No redness. No swelling. No pain.   Review of Systems As per above.    Objective:   Physical Exam  Constitutional: She appears well-developed and well-nourished.       Small framed,  underweight  HENT:  Head: Normocephalic and atraumatic.  Eyes: Conjunctivae are normal.  Neck: Normal range of motion. Neck supple.  Cardiovascular: Normal rate, regular rhythm and normal heart sounds.   No murmur heard. Pulmonary/Chest: Effort normal. No respiratory distress. She has no wheezes. She has no rales.       Diminished in bases bilateral.   Abdominal: Soft. She exhibits no distension.  Musculoskeletal: She exhibits no edema.       + contracture of palmar tendon of right 4th and 5th digit.   Neurological: She is alert.  Skin: No rash noted.  Psychiatric: She has a normal mood and affect.          Assessment & Plan:

## 2011-07-22 ENCOUNTER — Encounter: Payer: Self-pay | Admitting: Family Medicine

## 2011-07-22 ENCOUNTER — Ambulatory Visit (INDEPENDENT_AMBULATORY_CARE_PROVIDER_SITE_OTHER): Payer: Medicare Other | Admitting: *Deleted

## 2011-07-22 DIAGNOSIS — I4891 Unspecified atrial fibrillation: Secondary | ICD-10-CM

## 2011-07-22 DIAGNOSIS — M720 Palmar fascial fibromatosis [Dupuytren]: Secondary | ICD-10-CM | POA: Insufficient documentation

## 2011-07-22 DIAGNOSIS — Z7901 Long term (current) use of anticoagulants: Secondary | ICD-10-CM

## 2011-07-22 LAB — COMPREHENSIVE METABOLIC PANEL
CO2: 29 mEq/L (ref 19–32)
Calcium: 10 mg/dL (ref 8.4–10.5)
Chloride: 97 mEq/L (ref 96–112)
Creat: 0.9 mg/dL (ref 0.50–1.10)
Glucose, Bld: 123 mg/dL — ABNORMAL HIGH (ref 70–99)
Sodium: 136 mEq/L (ref 135–145)
Total Bilirubin: 0.5 mg/dL (ref 0.3–1.2)
Total Protein: 7 g/dL (ref 6.0–8.3)

## 2011-07-22 LAB — LDL CHOLESTEROL, DIRECT: Direct LDL: 99 mg/dL

## 2011-07-22 NOTE — Assessment & Plan Note (Addendum)
Refills given on all inhalers- sent to correct pharmacies. Pt taking spiriva, advair and albuterol as directed.  Pt states she isn't ready to quit smoking.

## 2011-07-22 NOTE — Assessment & Plan Note (Signed)
On lisinopril 10mg  and hctz 12.5mg  daily. Well controlled today.  rx sent to pharmacy for bp machine- pt to check 2 x per week- let me know if consistently greater than 140/90

## 2011-07-22 NOTE — Assessment & Plan Note (Signed)
Sees dr. Jens Som yearly- well controlled on diltiazem.  Family practice does refills of dilt.

## 2011-07-22 NOTE — Assessment & Plan Note (Signed)
Pt states she is not ready to quit smoking.  Reviewed the benefits of smoking cessation today.

## 2011-07-22 NOTE — Assessment & Plan Note (Addendum)
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  Discuss diagnosis.  Pt states that she would like to discuss with orthopedics.  Pt to call to make an appt at her orthpedist of choice- she is to let me know if her insurance requires a referral.  If it does I will fax referral to her preferred orthopedist.

## 2011-08-12 ENCOUNTER — Ambulatory Visit (INDEPENDENT_AMBULATORY_CARE_PROVIDER_SITE_OTHER): Payer: Medicare Other | Admitting: *Deleted

## 2011-08-12 DIAGNOSIS — I4891 Unspecified atrial fibrillation: Secondary | ICD-10-CM

## 2011-08-12 DIAGNOSIS — Z7901 Long term (current) use of anticoagulants: Secondary | ICD-10-CM

## 2011-08-12 LAB — POCT INR: INR: 2.3

## 2011-09-14 ENCOUNTER — Other Ambulatory Visit: Payer: Self-pay | Admitting: Family Medicine

## 2011-09-14 DIAGNOSIS — I10 Essential (primary) hypertension: Secondary | ICD-10-CM

## 2011-09-14 MED ORDER — HYDROCHLOROTHIAZIDE 12.5 MG PO CAPS: 12.5000 mg | ORAL_CAPSULE | Freq: Every day | ORAL | Status: DC

## 2011-09-14 NOTE — Telephone Encounter (Signed)
Refill sent to [facility]. Please make patient notify patient.

## 2011-09-14 NOTE — Telephone Encounter (Signed)
Patient is out of her HCTZ, was prescribed on 5/14 with no refill so her pharmacy has asked her to call for a new Rx.  She uses [facility] [address] and she is completely out and will ask the pharmacy to loan her some until the new Rx goes through.

## 2011-09-14 NOTE — Telephone Encounter (Signed)
Patient notified

## 2011-09-14 NOTE — Telephone Encounter (Signed)
Pt is asking to have this called in today since she is completely out and wasn't supposed to come back until September

## 2011-09-14 NOTE — Telephone Encounter (Signed)
Will forward to Dr Williamson 

## 2011-09-16 ENCOUNTER — Ambulatory Visit (INDEPENDENT_AMBULATORY_CARE_PROVIDER_SITE_OTHER): Payer: Medicare Other | Admitting: *Deleted

## 2011-09-16 DIAGNOSIS — I4891 Unspecified atrial fibrillation: Secondary | ICD-10-CM

## 2011-09-16 DIAGNOSIS — Z7901 Long term (current) use of anticoagulants: Secondary | ICD-10-CM

## 2011-10-21 ENCOUNTER — Ambulatory Visit (INDEPENDENT_AMBULATORY_CARE_PROVIDER_SITE_OTHER): Payer: Medicare Other | Admitting: *Deleted

## 2011-10-21 DIAGNOSIS — Z7901 Long term (current) use of anticoagulants: Secondary | ICD-10-CM

## 2011-10-21 DIAGNOSIS — I4891 Unspecified atrial fibrillation: Secondary | ICD-10-CM

## 2011-12-02 ENCOUNTER — Ambulatory Visit (INDEPENDENT_AMBULATORY_CARE_PROVIDER_SITE_OTHER): Payer: Medicare Other | Admitting: Pharmacist

## 2011-12-02 DIAGNOSIS — Z7901 Long term (current) use of anticoagulants: Secondary | ICD-10-CM

## 2011-12-02 DIAGNOSIS — I4891 Unspecified atrial fibrillation: Secondary | ICD-10-CM

## 2011-12-02 LAB — POCT INR: INR: 2.2

## 2011-12-18 ENCOUNTER — Ambulatory Visit (INDEPENDENT_AMBULATORY_CARE_PROVIDER_SITE_OTHER): Payer: Medicare Other | Admitting: Family Medicine

## 2011-12-18 ENCOUNTER — Encounter: Payer: Self-pay | Admitting: Family Medicine

## 2011-12-18 VITALS — BP 167/73 | HR 98 | Temp 97.8°F | Ht 63.0 in | Wt 95.2 lb

## 2011-12-18 DIAGNOSIS — Z23 Encounter for immunization: Secondary | ICD-10-CM

## 2011-12-18 DIAGNOSIS — R0989 Other specified symptoms and signs involving the circulatory and respiratory systems: Secondary | ICD-10-CM

## 2011-12-18 DIAGNOSIS — J449 Chronic obstructive pulmonary disease, unspecified: Secondary | ICD-10-CM

## 2011-12-18 DIAGNOSIS — J4489 Other specified chronic obstructive pulmonary disease: Secondary | ICD-10-CM

## 2011-12-18 DIAGNOSIS — I10 Essential (primary) hypertension: Secondary | ICD-10-CM

## 2011-12-18 DIAGNOSIS — F17200 Nicotine dependence, unspecified, uncomplicated: Secondary | ICD-10-CM

## 2011-12-18 NOTE — Patient Instructions (Addendum)
Dear Mrs. Wander,   Thank you for coming to clinic today. Please read below regarding the issues that we discussed.   1. Medications - Please ask the pharmacy to contact me when you need refills.   2. Smoking - I think it's great that you want to quit smoking. In order to have the best support, I would like you to talk to our pharmacist Dr. Raymondo Band. He is an expert about this and will provide information about nicotine replacement.   3. Your husband - Please ask for help when you are ready for it.  Please follow up in clinic in 2 months. Please call [phone] if you have any questions or concerns.   Sincerely,   Dr. Clinton Sawyer

## 2011-12-19 NOTE — Assessment & Plan Note (Signed)
No record of carotid ultrasound. Follow this up at next visit.

## 2011-12-19 NOTE — Assessment & Plan Note (Signed)
Patient ready to quit smoking. She is in need of lots of support, so I have referred her to Dr. Paulino Rily for counseling. She will make an appointment in the upcoming weeks.

## 2011-12-19 NOTE — Progress Notes (Signed)
  Subjective:    Patient ID: [name], female    DOB: 1940-04-01, 71 y.o.   MRN: [id number]  HPI  71 year old F with a PMH of HTN, atrial fibrillation requiring chronic anticoagulation, COPD and tobacco abuse who presents to meet me as her new PCP. Today the patient has not complaints and is taking her medications as prescribed. She does not need any refills.   Tobacco Abuse/COPD - The patient is ready to try smoking cessation. She states that she knows the risks of smoking and acknowledges that being the cause of her current lung disease. However, she is very concerned that she will not be able to handle it "mentally" since she has smoked for greater than 50 years. She has never tried quitting before and believes that stress relief is something that she enjoys about smoking. She is willing to talk to Dr. Raymondo Band about cessation strategies.   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 She notes that he has stopped bathing and brushing his teeth. She does not have any assistance in the home and is close to asking for any available services to help her in the home.   Review of Systems Negative for chest pain, SOB, unilateral weakness or loss of vision    Objective:   Physical Exam BP 167/73  Pulse 98  Temp 97.8 F (36.6 C) (Oral)  Ht 5\' 3"  (1.6 m)  Wt 95 lb 3.2 oz (43.182 kg)  BMI 16.86 kg/m2 Gen: elderly WF, appears older than stated age, pleasant and conversant, very think body habitus/cachectic appearing WU:[id number] heart sounds Lungs: decreased breath sounds throughout, poor respiratory effort     Assessment & Plan:  71 year old F with need for COPD, tobacco abuse, and HTN who needs prophylactic vaccination against pneumococcus, TDaP, and influenza.

## 2011-12-19 NOTE — Assessment & Plan Note (Signed)
Not in control today on Lisinopril 10 mg and HCTZ 12.5. Consider increasing to HCTZ 25 is BP readings continue to be high.

## 2011-12-19 NOTE — Assessment & Plan Note (Signed)
Patient to continue inhalers of spiriva, advair, and albuterol. She is willing to quit smoking. She also had pneumococcal, influenza, and TDaP vaccine today.

## 2012-01-13 ENCOUNTER — Ambulatory Visit (INDEPENDENT_AMBULATORY_CARE_PROVIDER_SITE_OTHER): Payer: Medicare Other | Admitting: *Deleted

## 2012-01-13 DIAGNOSIS — I4891 Unspecified atrial fibrillation: Secondary | ICD-10-CM

## 2012-01-13 DIAGNOSIS — Z7901 Long term (current) use of anticoagulants: Secondary | ICD-10-CM

## 2012-02-24 ENCOUNTER — Ambulatory Visit (INDEPENDENT_AMBULATORY_CARE_PROVIDER_SITE_OTHER): Payer: Medicare Other | Admitting: *Deleted

## 2012-02-24 DIAGNOSIS — Z7901 Long term (current) use of anticoagulants: Secondary | ICD-10-CM

## 2012-02-24 DIAGNOSIS — I4891 Unspecified atrial fibrillation: Secondary | ICD-10-CM

## 2012-03-07 ENCOUNTER — Other Ambulatory Visit: Payer: Self-pay | Admitting: *Deleted

## 2012-03-07 DIAGNOSIS — I10 Essential (primary) hypertension: Secondary | ICD-10-CM

## 2012-03-08 MED ORDER — HYDROCHLOROTHIAZIDE 12.5 MG PO CAPS: 12.5000 mg | ORAL_CAPSULE | Freq: Every day | ORAL | Status: DC

## 2012-04-06 ENCOUNTER — Other Ambulatory Visit: Payer: Self-pay | Admitting: *Deleted

## 2012-04-06 DIAGNOSIS — J449 Chronic obstructive pulmonary disease, unspecified: Secondary | ICD-10-CM

## 2012-04-06 DIAGNOSIS — J4489 Other specified chronic obstructive pulmonary disease: Secondary | ICD-10-CM

## 2012-04-07 MED ORDER — ALBUTEROL SULFATE (2.5 MG/3ML) 0.083% IN NEBU: 2.5000 mg | INHALATION_SOLUTION | Freq: Four times a day (QID) | RESPIRATORY_TRACT | Status: DC | PRN

## 2012-04-11 ENCOUNTER — Ambulatory Visit (INDEPENDENT_AMBULATORY_CARE_PROVIDER_SITE_OTHER): Payer: Medicare Other

## 2012-04-11 DIAGNOSIS — I4891 Unspecified atrial fibrillation: Secondary | ICD-10-CM

## 2012-04-11 DIAGNOSIS — Z7901 Long term (current) use of anticoagulants: Secondary | ICD-10-CM

## 2012-05-02 ENCOUNTER — Telehealth: Payer: Self-pay | Admitting: Pharmacist

## 2012-05-02 ENCOUNTER — Ambulatory Visit (INDEPENDENT_AMBULATORY_CARE_PROVIDER_SITE_OTHER): Payer: Medicare Other | Admitting: Family Medicine

## 2012-05-02 ENCOUNTER — Encounter: Payer: Self-pay | Admitting: Family Medicine

## 2012-05-02 ENCOUNTER — Ambulatory Visit (INDEPENDENT_AMBULATORY_CARE_PROVIDER_SITE_OTHER): Payer: Medicare Other

## 2012-05-02 VITALS — BP 180/92 | HR 128 | Temp 99.9°F | Ht 63.0 in | Wt 95.0 lb

## 2012-05-02 DIAGNOSIS — R197 Diarrhea, unspecified: Secondary | ICD-10-CM

## 2012-05-02 DIAGNOSIS — Z7901 Long term (current) use of anticoagulants: Secondary | ICD-10-CM

## 2012-05-02 DIAGNOSIS — I4891 Unspecified atrial fibrillation: Secondary | ICD-10-CM

## 2012-05-02 DIAGNOSIS — R0602 Shortness of breath: Secondary | ICD-10-CM

## 2012-05-02 DIAGNOSIS — J441 Chronic obstructive pulmonary disease with (acute) exacerbation: Secondary | ICD-10-CM

## 2012-05-02 LAB — POCT INR: INR: 4.7

## 2012-05-02 MED ORDER — PREDNISONE 50 MG PO TABS: 50.0000 mg | ORAL_TABLET | Freq: Every day | ORAL | Status: DC

## 2012-05-02 NOTE — Telephone Encounter (Signed)
Patient called to report some rectal bleeding. Advised patient that she should come in this morning to get INR checked but that if she is having significant bleeding she needs to get evaluated by MD. Patient verbalized understanding - stated she has MD appointment at 10am today. INR check re-scheduled for today.

## 2012-05-02 NOTE — Progress Notes (Signed)
  Subjective:    Patient ID: [name], female    DOB: [birth year], 72 y.o.   MRN: [id number]  HPI  Patient presents to same day clinic for productive cough, chills, and subjective fevers. Symptoms started 2 weeks ago.   She is a current smoker (1 ppd) and she thinks this is another COPD exacerbation. Cough is productive, but she cannot cough it up. Dyspnea mostly at night, no home oxygen. Denies any chest pain.   Requesting prednisone, has a nebulizer at home (using twice per daily).  Of note, patient tells me she has had diarrhea started 3 days ago. Watery and bloody (has a hx hemorrhoids and on coumadin), occurs 4-5 times per day. She takes [employer] which is helping a little. Has an appointment at coumadin clinic later today.  Review of Systems Per HPI    Objective:   Physical Exam  Constitutional: No distress.  Very thin  HENT:  Head: Normocephalic and atraumatic.  Mouth/Throat: Oropharynx is clear and moist.  Cardiovascular: Normal rate and normal heart sounds.   Pulmonary/Chest: Effort normal and breath sounds normal. She has no wheezes. She has no rales.  Coarse BS diffusely  Abdominal: Soft. She exhibits no distension. There is no tenderness.      Assessment & Plan:

## 2012-05-02 NOTE — Patient Instructions (Addendum)
Please pick up Prednisone and take one tablet daily for 5 days. Use Albuterol and Advair every 4 hours while awake for the next 2 days. If you develop worsening shortness of breath, worsening cough, associated with fever, please return to clinic. If your diarrhea does get better in the next 1-2 weeks, please return to clinic. Remember to drink plenty of fluids and get plenty of rest.  Chronic Obstructive Pulmonary Disease Chronic obstructive pulmonary disease (COPD) is a lung disease. The lungs become damaged, making it hard to get air in and out of your lungs. The damage to your lungs cannot be changed.  HOME CARE  Stop smoking if you smoke. Avoid secondhand smoke.  Only take medicine as told by your doctor.  Talk to your doctor about using cough syrup or over-the-counter medicines.  Drink enough fluids to keep your pee (urine) clear or pale yellow.  Use a humidifier or vaporizer. This may help loosen the thick spit (mucus).  Talk to your doctor about vaccines that help prevent other lung problems (pneumonia and flu vaccines).  Use home oxygen as told by your doctor.  Stay active and exercise.  Eat healthy foods. GET HELP RIGHT AWAY IF:   Your heart is beating fast.  You become disturbed, confused, shake, or are dazed.  You have trouble breathing.  You have chest pain.  You have a fever.  You cough up thick spit that is yellowish-white or green.  Your breathing becomes worse when you exercise.  You are running out of the medicine you take for your breathing. MAKE SURE YOU:   Understand these instructions.  Will watch your condition.  Will get help right away if you are not doing well or get worse. Document Released: 08/12/2007 Document Revised: 05/18/2011 Document Reviewed: 04/25/2010 [facility] Patient Information 2013 [place], [place].

## 2012-05-02 NOTE — Assessment & Plan Note (Signed)
Diarrhea likely viral etiology.  Advised patient to let it takes it course and avoid Pepto Bismol for now.  Advised patient to schedule follow up appointment this week to make sure diarrhea resolves, but she said she is busy taking care of her husbands this week.   Also advised patient to drink plenty of fluids to prevent dehydration.  INR will be checked at [facility] today.  May need to be adjusted for bloody diarrhea.  If no improvement in symptoms in one week, advised her to follow up with PCP.

## 2012-05-02 NOTE — Assessment & Plan Note (Signed)
Dyspnea likely secondary to COPD exacerbation.  Will treat with Prednisone 50 x 5 days.  Advised patient to use Albuterol every 4 hours scheduled while awake for the next 2 days.  Will hold off antibiotics today due to possible interaction with INR and hx of diarrhea which could be worsened by antibiotics.  Red flags reviewed.  Return to clinic if symptoms worsen.

## 2012-05-06 ENCOUNTER — Ambulatory Visit (INDEPENDENT_AMBULATORY_CARE_PROVIDER_SITE_OTHER): Payer: Medicare Other | Admitting: *Deleted

## 2012-05-06 DIAGNOSIS — I4891 Unspecified atrial fibrillation: Secondary | ICD-10-CM

## 2012-05-06 DIAGNOSIS — Z7901 Long term (current) use of anticoagulants: Secondary | ICD-10-CM

## 2012-05-06 LAB — POCT INR: INR: 1.5

## 2012-05-16 ENCOUNTER — Ambulatory Visit (INDEPENDENT_AMBULATORY_CARE_PROVIDER_SITE_OTHER): Payer: Medicare Other

## 2012-05-16 DIAGNOSIS — I4891 Unspecified atrial fibrillation: Secondary | ICD-10-CM

## 2012-05-16 DIAGNOSIS — Z7901 Long term (current) use of anticoagulants: Secondary | ICD-10-CM

## 2012-05-16 LAB — POCT INR: INR: 3.3

## 2012-05-30 ENCOUNTER — Ambulatory Visit (INDEPENDENT_AMBULATORY_CARE_PROVIDER_SITE_OTHER): Payer: Medicare Other | Admitting: *Deleted

## 2012-05-30 DIAGNOSIS — Z7901 Long term (current) use of anticoagulants: Secondary | ICD-10-CM

## 2012-05-30 DIAGNOSIS — I4891 Unspecified atrial fibrillation: Secondary | ICD-10-CM

## 2012-06-13 ENCOUNTER — Ambulatory Visit (INDEPENDENT_AMBULATORY_CARE_PROVIDER_SITE_OTHER): Payer: Medicare Other | Admitting: *Deleted

## 2012-06-13 DIAGNOSIS — I4891 Unspecified atrial fibrillation: Secondary | ICD-10-CM

## 2012-06-13 DIAGNOSIS — Z7901 Long term (current) use of anticoagulants: Secondary | ICD-10-CM

## 2012-07-04 ENCOUNTER — Ambulatory Visit (INDEPENDENT_AMBULATORY_CARE_PROVIDER_SITE_OTHER): Payer: Medicare Other | Admitting: *Deleted

## 2012-07-04 DIAGNOSIS — Z7901 Long term (current) use of anticoagulants: Secondary | ICD-10-CM

## 2012-07-04 DIAGNOSIS — I4891 Unspecified atrial fibrillation: Secondary | ICD-10-CM

## 2012-07-13 ENCOUNTER — Telehealth: Payer: Self-pay | Admitting: Cardiology

## 2012-07-13 NOTE — Telephone Encounter (Signed)
Telephoned pt and she is unaware of name of procedure and does not know what type of specialist Dr Katherina Mires at [facility] is that's doing the procedure. Thus got # from her and called his office, [phone]. Dr Katherina Mires is Orthopedic Surgeon they are performing a procedure where the nurse states they want her off her coumadin  for 7 days , procedure is set for 07/18/12 and pt is currently taking her coumadin. Asked nurse if they received clearance from Dr Jens Som to hold coumadin and she states they leave that in the hands of pt, explained to them that the pt didn't even know name of procedure and what she was suppose to do with her coumadin therapy thus they should send /fax note with name of procedure and recommendations  For holding to Dr Jens Som. Also call pt and make her aware that procedure will have to be rescheduled.

## 2012-07-13 NOTE — Telephone Encounter (Signed)
New Prob     Pt is having needle therapy done on 5/12 and has some questions regarding her coumadin and how long she needs to be off of it.

## 2012-07-29 ENCOUNTER — Other Ambulatory Visit: Payer: Self-pay | Admitting: *Deleted

## 2012-07-29 DIAGNOSIS — I10 Essential (primary) hypertension: Secondary | ICD-10-CM

## 2012-07-29 MED ORDER — DILTIAZEM HCL 90 MG PO TABS: 90.0000 mg | ORAL_TABLET | Freq: Four times a day (QID) | ORAL | Status: DC

## 2012-08-03 ENCOUNTER — Encounter: Payer: Self-pay | Admitting: Cardiology

## 2012-08-03 ENCOUNTER — Ambulatory Visit (INDEPENDENT_AMBULATORY_CARE_PROVIDER_SITE_OTHER): Payer: Medicare Other | Admitting: Pharmacist

## 2012-08-03 ENCOUNTER — Ambulatory Visit (INDEPENDENT_AMBULATORY_CARE_PROVIDER_SITE_OTHER): Payer: Medicare Other | Admitting: Cardiology

## 2012-08-03 VITALS — BP 164/82 | HR 106 | Wt 98.0 lb

## 2012-08-03 DIAGNOSIS — Z7901 Long term (current) use of anticoagulants: Secondary | ICD-10-CM

## 2012-08-03 DIAGNOSIS — F17200 Nicotine dependence, unspecified, uncomplicated: Secondary | ICD-10-CM

## 2012-08-03 DIAGNOSIS — I4891 Unspecified atrial fibrillation: Secondary | ICD-10-CM

## 2012-08-03 DIAGNOSIS — I10 Essential (primary) hypertension: Secondary | ICD-10-CM

## 2012-08-03 LAB — CBC WITH DIFFERENTIAL/PLATELET
Basophils Relative: 0.4 % (ref 0.0–3.0)
Eosinophils Absolute: 0.1 10*3/uL (ref 0.0–0.7)
HCT: 47.3 % — ABNORMAL HIGH (ref 36.0–46.0)
Hemoglobin: 16 g/dL — ABNORMAL HIGH (ref 12.0–15.0)
Lymphocytes Relative: 21 % (ref 12.0–46.0)
Lymphs Abs: 2 10*3/uL (ref 0.7–4.0)
MCHC: 33.8 g/dL (ref 30.0–36.0)
MCV: 95.4 fl (ref 78.0–100.0)
Monocytes Absolute: 0.9 10*3/uL (ref 0.1–1.0)
Neutro Abs: 6.6 10*3/uL (ref 1.4–7.7)
RBC: 4.96 Mil/uL (ref 3.87–5.11)
RDW: 13.2 % (ref 11.5–14.6)

## 2012-08-03 NOTE — Assessment & Plan Note (Signed)
Patient counseled on discontinuing. 

## 2012-08-03 NOTE — Assessment & Plan Note (Signed)
Blood pressure controlled. Continue present medications. 

## 2012-08-03 NOTE — Assessment & Plan Note (Signed)
Patient remains in sinus rhythm. Continue Cardizem. Continue Coumadin with goal INR 2-3. Check hemoglobin. She is scheduled to have surgery for Dupuytren's contractures. She has not had a prior embolic event. I have given her the okay for discontinuing her Coumadin prior to the procedure and she will resume afterwards.

## 2012-08-03 NOTE — Patient Instructions (Addendum)
Your physician wants you to follow-up in: ONE YEAR WITH DR CRENSHAW You will receive a reminder letter in the mail two months in advance. If you don't receive a letter, please call our office to schedule the follow-up appointment.   Your physician recommends that you HAVE LAB WORK TODAY 

## 2012-08-03 NOTE — Progress Notes (Signed)
HPI: [name] is a very pleasant female who has a history of COPD as well as paroxysmal atrial fibrillation. A Myoview   performed on January 19, 2008 revealed inferoseptal thinning, but there was no scar or ischemia.  Her ejection fraction was 76%.  Note, her LV function was normal by echocardiogram performed on November 29, 2007 with an ejection fraction of 60-70%. A previous TSH was normal. Abdominal ultrasound was normal with no aneurysm in September 2009. Carotid Dopplers were normal in September 2009. I have not seen her since June 2011. Since then, she has some dyspnea on exertion but no chest pain or palpitations.  Current Outpatient Prescriptions  Medication Sig Dispense Refill  . albuterol (PROVENTIL HFA) 108 (90 BASE) MCG/ACT inhaler Inhale 2 puffs into the lungs every 4 (four) hours as needed for wheezing. Inhale 2 puff using inhaler every four hours  1 Inhaler  4  . albuterol (PROVENTIL) (2.5 MG/3ML) 0.083% nebulizer solution Take 3 mLs (2.5 mg total) by nebulization every 6 (six) hours as needed for wheezing.  75 mL  12  . Blood Pressure Monitoring (BLOOD PRESSURE MONITOR/WRIST) DEVI 1 Device by Does not apply route once.  1 Device  0  . diltiazem (CARDIZEM) 90 MG tablet Take 1 tablet (90 mg total) by mouth 4 (four) times daily.  120 tablet  0  . Fluticasone-Salmeterol (ADVAIR DISKUS) 500-50 MCG/DOSE AEPB Inhale 1 puff into the lungs 2 (two) times daily.  60 each  11  . hydrochlorothiazide (MICROZIDE) 12.5 MG capsule Take 1 capsule (12.5 mg total) by mouth daily. For high blood pressure.  30 capsule  5  . ipratropium (ATROVENT HFA) 17 MCG/ACT inhaler Inhale 2 puffs into the lungs every 6 (six) hours.  1 Inhaler  12  . lisinopril (PRINIVIL,ZESTRIL) 10 MG tablet Take 1 tablet (10 mg total) by mouth daily.  30 tablet  11  . warfarin (COUMADIN) 5 MG tablet Take 1 tablet (5 mg total) by mouth as directed. Take as directed by coumadin clinic.  60 tablet  6   No current  facility-administered medications for this visit.     Past Medical History  Diagnosis Date  . VAGINITIS, ATROPHIC 05/06/2006    Qualifier: Diagnosis of  By: Haydee Salter    . ZENKER'S DIVERTICULUM 10/04/2006    Qualifier: Diagnosis of  By: Georgiana Shore  MD, Vernona Rieger    . Atrial fibrillation   . COPD (chronic obstructive pulmonary disease)   . Hypertension     History reviewed. No pertinent past surgical history.  History   Social History  . Marital Status: Married    Spouse Name: N/A    Number of Children: N/A  . Years of Education: N/A   Occupational History  . Not on file.   Social History Main Topics  . Smoking status: Current Every Day Smoker -- 1.00 packs/day    Types: Cigarettes  . Smokeless tobacco: Not on file     Comment: would like to try to quitt after 54 years!!!!!  . Alcohol Use: Not on file  . Drug Use: Not on file  . Sexually Active: Not on file   Other Topics Concern  . Not on file   Social History Narrative  . No narrative on file    ROS: no fevers or chills, productive cough, hemoptysis, dysphasia, odynophagia, melena, hematochezia, dysuria, hematuria, rash, seizure activity, orthopnea, PND, pedal edema, claudication. Remaining systems are negative.  Physical Exam: Well-developed frail in no acute distress.  Skin is  warm and dry.  HEENT is normal.  Neck is supple.  Chest with diminished BS throughout Cardiovascular exam is regular rate and rhythm.  Abdominal exam nontender or distended. No masses palpated. Extremities show no edema. neuro grossly intact  ECG sinus tachycardia at a rate of 106. No ST changes.

## 2012-08-08 ENCOUNTER — Other Ambulatory Visit: Payer: Self-pay | Admitting: *Deleted

## 2012-08-08 MED ORDER — LISINOPRIL 10 MG PO TABS: 10.0000 mg | ORAL_TABLET | Freq: Every day | ORAL | Status: DC

## 2012-08-10 ENCOUNTER — Other Ambulatory Visit: Payer: Self-pay | Admitting: *Deleted

## 2012-08-10 DIAGNOSIS — J441 Chronic obstructive pulmonary disease with (acute) exacerbation: Secondary | ICD-10-CM

## 2012-08-10 MED ORDER — ALBUTEROL SULFATE HFA 108 (90 BASE) MCG/ACT IN AERS: 2.0000 | INHALATION_SPRAY | RESPIRATORY_TRACT | Status: DC | PRN

## 2012-08-10 MED ORDER — FLUTICASONE-SALMETEROL 500-50 MCG/DOSE IN AEPB: 1.0000 | INHALATION_SPRAY | Freq: Two times a day (BID) | RESPIRATORY_TRACT | Status: DC

## 2012-08-10 NOTE — Telephone Encounter (Signed)
Requested Prescriptions   Pending Prescriptions Disp Refills  . Fluticasone-Salmeterol (ADVAIR DISKUS) 500-50 MCG/DOSE AEPB 60 each 11    Sig: Inhale 1 puff into the lungs 2 (two) times daily.   Fax to [facility] please Wyatt Haste, RN-BSN

## 2012-08-10 NOTE — Telephone Encounter (Signed)
Requested Prescriptions   Pending Prescriptions Disp Refills  . Fluticasone-Salmeterol (ADVAIR DISKUS) 500-50 MCG/DOSE AEPB 60 each 11    Sig: Inhale 1 puff into the lungs 2 (two) times daily.  Marland Kitchen albuterol (PROVENTIL HFA) 108 (90 BASE) MCG/ACT inhaler 1 Inhaler 4    Sig: Inhale 2 puffs into the lungs every 4 (four) hours as needed for wheezing. Inhale 2 puff using inhaler every four hours

## 2012-08-14 ENCOUNTER — Other Ambulatory Visit: Payer: Self-pay | Admitting: Family Medicine

## 2012-08-15 ENCOUNTER — Telehealth: Payer: Self-pay | Admitting: Family Medicine

## 2012-08-15 NOTE — Telephone Encounter (Signed)
I spoke to Darnelle Going, CMA who spoke with the patient, and the patient clarified that she needed Advair and not albuterol. I appreciate the Kristin's efforts.

## 2012-08-15 NOTE — Telephone Encounter (Signed)
Advair called into [facility] ([phone]). LMOVM at [facility].  Pt notified.   Devlon Dosher, Darlyne Russian, CMA

## 2012-08-15 NOTE — Telephone Encounter (Signed)
Patient calls stating that the [facility]buterol [facility] not [facility] [address]. Pls call patient when this correction has been made.

## 2012-08-16 ENCOUNTER — Ambulatory Visit (INDEPENDENT_AMBULATORY_CARE_PROVIDER_SITE_OTHER): Payer: Medicare Other | Admitting: Family Medicine

## 2012-08-16 ENCOUNTER — Encounter: Payer: Self-pay | Admitting: Family Medicine

## 2012-08-16 VITALS — BP 147/80 | HR 90 | Ht 63.0 in | Wt 94.7 lb

## 2012-08-16 DIAGNOSIS — I10 Essential (primary) hypertension: Secondary | ICD-10-CM

## 2012-08-16 DIAGNOSIS — J449 Chronic obstructive pulmonary disease, unspecified: Secondary | ICD-10-CM

## 2012-08-16 DIAGNOSIS — J441 Chronic obstructive pulmonary disease with (acute) exacerbation: Secondary | ICD-10-CM

## 2012-08-16 MED ORDER — IPRATROPIUM BROMIDE HFA 17 MCG/ACT IN AERS: 2.0000 | INHALATION_SPRAY | Freq: Four times a day (QID) | RESPIRATORY_TRACT | Status: DC

## 2012-08-16 MED ORDER — HYDROCHLOROTHIAZIDE 12.5 MG PO CAPS: 12.5000 mg | ORAL_CAPSULE | Freq: Every day | ORAL | Status: DC

## 2012-08-16 MED ORDER — LISINOPRIL 10 MG PO TABS: 10.0000 mg | ORAL_TABLET | Freq: Every day | ORAL | Status: DC

## 2012-08-16 MED ORDER — PREDNISONE 20 MG PO TABS: 40.0000 mg | ORAL_TABLET | Freq: Every day | ORAL | Status: DC

## 2012-08-16 MED ORDER — DILTIAZEM HCL 90 MG PO TABS: 90.0000 mg | ORAL_TABLET | Freq: Four times a day (QID) | ORAL | Status: DC

## 2012-08-16 NOTE — Patient Instructions (Addendum)
Dear Mrs. Tardiff,   Thank you for coming to see me today. I am sorry about the issues with the Advair. Please use the prednisone 40 mg a day for 5 days if needed. Also, return to clinic if you do not improve. In the future, you need to have a colonoscopy. I will give you information about that.   Please return in 3 months.   Dr. Clinton Sawyer

## 2012-08-16 NOTE — Progress Notes (Signed)
  Subjective:    Patient ID: [name], female    DOB: 07-Aug-1940, 72 y.o.   MRN: [id number]  HPI  COPD: - Shortness of breath with minimal exertion since running out of Advair 9 days ago, [facility]ill [facility] - Still on atrovent and albuterol nebulizer/inhaler; She denies ever using oxygen - Still smoking 1ppd and not interested in quitting - Denies fever and chills   Hypertension  Home BP monitoring:   Office BP: BP Readings from Last 3 Encounters:  08/16/12 147/80  08/03/12 164/82  05/02/12 180/92    Prescribed meds: Lisinopril 10 mg daily, HCTZ 12.5 mg daily   Hypertension ROS:  Taking medications as prescribed:No Chest pain: No Shortness of breath: Yes - severe COPD Swelling of extremities: No TIA symptoms: No   Review of Systems See HPI    Objective:   Physical Exam BP 147/80  Pulse 90  Ht 5\' 3"  (1.6 m)  Wt 94 lb 11.2 oz (42.956 kg)  BMI 16.78 kg/m2  SpO2 92%  Gen: elderly WF, chronically ill appearing, cachectic, pleasant and conversant  CV: distant heart sounds Pulm: patient supporting self with arms on thighs, mild pursed lip breathing, diffuse mild expiratory wheezes, overall poor air movement Extremities: no edema     Assessment & Plan:

## 2012-08-18 NOTE — Assessment & Plan Note (Signed)
Well controlled. Continue current medications  

## 2012-08-18 NOTE — Assessment & Plan Note (Addendum)
I have refilled Advair. If she does not improve symptoms, I instructed her to take prednisone 40 mg PO x 5 days and return if symptoms persistent. Regardless of short-term treatment, patient will need to quit smoking to expect any control of disease. Otherwise, I believe she will need oxygen therapy in the near future.

## 2012-08-29 ENCOUNTER — Other Ambulatory Visit: Payer: Self-pay | Admitting: *Deleted

## 2012-08-29 DIAGNOSIS — J441 Chronic obstructive pulmonary disease with (acute) exacerbation: Secondary | ICD-10-CM

## 2012-08-31 ENCOUNTER — Ambulatory Visit (INDEPENDENT_AMBULATORY_CARE_PROVIDER_SITE_OTHER): Payer: Medicare Other | Admitting: *Deleted

## 2012-08-31 DIAGNOSIS — Z7901 Long term (current) use of anticoagulants: Secondary | ICD-10-CM

## 2012-08-31 DIAGNOSIS — I4891 Unspecified atrial fibrillation: Secondary | ICD-10-CM

## 2012-08-31 LAB — POCT INR: INR: 2.6

## 2012-08-31 NOTE — Patient Instructions (Addendum)
Surgeon wants pt off coumadin for 7 days Her last dose coumadin is 09/11/2012 When your surgeon says its ok to start coumadin back take an extra 1/2 tablet 2 days in a row

## 2012-09-13 ENCOUNTER — Telehealth: Payer: Self-pay | Admitting: Cardiology

## 2012-09-13 NOTE — Telephone Encounter (Signed)
CVRR note from 08-31-12 with clearance under comments faxed to the number provided

## 2012-09-13 NOTE — Telephone Encounter (Signed)
New problem   Procedure on  7/14. Patient need to stop coumadin 5 days prior - hand procedure.

## 2012-09-27 ENCOUNTER — Ambulatory Visit (INDEPENDENT_AMBULATORY_CARE_PROVIDER_SITE_OTHER): Payer: Self-pay | Admitting: Pharmacist

## 2012-09-27 DIAGNOSIS — I4891 Unspecified atrial fibrillation: Secondary | ICD-10-CM

## 2012-09-27 DIAGNOSIS — Z7901 Long term (current) use of anticoagulants: Secondary | ICD-10-CM

## 2012-10-04 ENCOUNTER — Other Ambulatory Visit: Payer: Self-pay | Admitting: *Deleted

## 2012-10-04 DIAGNOSIS — J441 Chronic obstructive pulmonary disease with (acute) exacerbation: Secondary | ICD-10-CM

## 2012-10-10 ENCOUNTER — Other Ambulatory Visit: Payer: Self-pay | Admitting: Family Medicine

## 2012-10-10 DIAGNOSIS — J441 Chronic obstructive pulmonary disease with (acute) exacerbation: Secondary | ICD-10-CM

## 2012-10-10 MED ORDER — ALBUTEROL SULFATE HFA 108 (90 BASE) MCG/ACT IN AERS: 2.0000 | INHALATION_SPRAY | RESPIRATORY_TRACT | Status: DC | PRN

## 2012-10-25 ENCOUNTER — Other Ambulatory Visit: Payer: Self-pay | Admitting: Emergency Medicine

## 2012-10-25 ENCOUNTER — Ambulatory Visit (INDEPENDENT_AMBULATORY_CARE_PROVIDER_SITE_OTHER): Payer: Medicare Other | Admitting: *Deleted

## 2012-10-25 ENCOUNTER — Other Ambulatory Visit: Payer: Self-pay | Admitting: *Deleted

## 2012-10-25 DIAGNOSIS — Z7901 Long term (current) use of anticoagulants: Secondary | ICD-10-CM

## 2012-10-25 DIAGNOSIS — J441 Chronic obstructive pulmonary disease with (acute) exacerbation: Secondary | ICD-10-CM

## 2012-10-25 DIAGNOSIS — I4891 Unspecified atrial fibrillation: Secondary | ICD-10-CM

## 2012-10-25 LAB — POCT INR: INR: 2.3

## 2012-10-25 MED ORDER — ALBUTEROL SULFATE HFA 108 (90 BASE) MCG/ACT IN AERS: 2.0000 | INHALATION_SPRAY | RESPIRATORY_TRACT | Status: DC | PRN

## 2012-10-25 MED ORDER — IPRATROPIUM BROMIDE HFA 17 MCG/ACT IN AERS: 2.0000 | INHALATION_SPRAY | Freq: Four times a day (QID) | RESPIRATORY_TRACT | Status: DC

## 2012-11-22 ENCOUNTER — Ambulatory Visit (INDEPENDENT_AMBULATORY_CARE_PROVIDER_SITE_OTHER): Payer: Medicare Other

## 2012-11-22 DIAGNOSIS — Z7901 Long term (current) use of anticoagulants: Secondary | ICD-10-CM

## 2012-11-22 DIAGNOSIS — I4891 Unspecified atrial fibrillation: Secondary | ICD-10-CM

## 2012-11-22 LAB — POCT INR: INR: 2.5

## 2012-12-20 ENCOUNTER — Ambulatory Visit (INDEPENDENT_AMBULATORY_CARE_PROVIDER_SITE_OTHER): Payer: Medicare Other | Admitting: General Practice

## 2012-12-20 DIAGNOSIS — Z7901 Long term (current) use of anticoagulants: Secondary | ICD-10-CM

## 2012-12-20 DIAGNOSIS — I4891 Unspecified atrial fibrillation: Secondary | ICD-10-CM

## 2012-12-20 LAB — POCT INR: INR: 2.1

## 2012-12-23 ENCOUNTER — Telehealth: Payer: Self-pay | Admitting: Cardiology

## 2012-12-23 NOTE — Telephone Encounter (Signed)
New message    Talk to a nurse in the coumadin clinic---she would not tell me what the problem or message is

## 2012-12-23 NOTE — Telephone Encounter (Signed)
Spoke with pt.  She has a history of hemorrhoidal bleeding and this flared up last night.  States she bled through a couple of night gowns and house coats.  She is concerned about taking her Coumadin tonight.  INR on 10/14 was 2.1.  She is only suppose to take 1/2 tablet today.  Bleeding has stopped at this time.  Told her to take tonight's dose but she can 1/2 tomorrow's dose if she continues to have intermittent bleeding.  Also suggested she follow up with PCP if no resolution.

## 2013-01-09 ENCOUNTER — Ambulatory Visit (INDEPENDENT_AMBULATORY_CARE_PROVIDER_SITE_OTHER): Payer: Medicare Other | Admitting: *Deleted

## 2013-01-09 DIAGNOSIS — Z23 Encounter for immunization: Secondary | ICD-10-CM

## 2013-01-31 ENCOUNTER — Ambulatory Visit (INDEPENDENT_AMBULATORY_CARE_PROVIDER_SITE_OTHER): Payer: Medicare Other | Admitting: General Practice

## 2013-01-31 DIAGNOSIS — I4891 Unspecified atrial fibrillation: Secondary | ICD-10-CM

## 2013-01-31 DIAGNOSIS — Z7901 Long term (current) use of anticoagulants: Secondary | ICD-10-CM

## 2013-03-14 ENCOUNTER — Ambulatory Visit (INDEPENDENT_AMBULATORY_CARE_PROVIDER_SITE_OTHER): Payer: Medicare Other | Admitting: Pharmacist

## 2013-03-14 DIAGNOSIS — I4891 Unspecified atrial fibrillation: Secondary | ICD-10-CM

## 2013-03-14 DIAGNOSIS — Z7901 Long term (current) use of anticoagulants: Secondary | ICD-10-CM

## 2013-03-14 LAB — POCT INR: INR: 2

## 2013-05-08 ENCOUNTER — Ambulatory Visit (INDEPENDENT_AMBULATORY_CARE_PROVIDER_SITE_OTHER): Payer: Medicare Other | Admitting: *Deleted

## 2013-05-08 DIAGNOSIS — Z5181 Encounter for therapeutic drug level monitoring: Secondary | ICD-10-CM | POA: Insufficient documentation

## 2013-05-08 DIAGNOSIS — Z7901 Long term (current) use of anticoagulants: Secondary | ICD-10-CM

## 2013-05-08 DIAGNOSIS — I4891 Unspecified atrial fibrillation: Secondary | ICD-10-CM

## 2013-05-08 LAB — POCT INR: INR: 2.7

## 2013-05-09 ENCOUNTER — Other Ambulatory Visit: Payer: Self-pay | Admitting: *Deleted

## 2013-05-09 MED ORDER — FLUTICASONE-SALMETEROL 500-50 MCG/DOSE IN AEPB
1.0000 | INHALATION_SPRAY | Freq: Two times a day (BID) | RESPIRATORY_TRACT | Status: DC
Start: 2013-05-09 — End: 2013-05-26

## 2013-05-26 ENCOUNTER — Other Ambulatory Visit: Payer: Self-pay | Admitting: Family Medicine

## 2013-05-26 ENCOUNTER — Other Ambulatory Visit: Payer: Self-pay | Admitting: *Deleted

## 2013-05-26 MED ORDER — FLUTICASONE-SALMETEROL 500-50 MCG/DOSE IN AEPB: 1.0000 | INHALATION_SPRAY | Freq: Two times a day (BID) | RESPIRATORY_TRACT | Status: DC

## 2013-06-20 ENCOUNTER — Ambulatory Visit (INDEPENDENT_AMBULATORY_CARE_PROVIDER_SITE_OTHER): Payer: Medicare Other

## 2013-06-20 DIAGNOSIS — I4891 Unspecified atrial fibrillation: Secondary | ICD-10-CM

## 2013-06-20 DIAGNOSIS — Z7901 Long term (current) use of anticoagulants: Secondary | ICD-10-CM

## 2013-06-20 DIAGNOSIS — Z5181 Encounter for therapeutic drug level monitoring: Secondary | ICD-10-CM

## 2013-06-20 LAB — POCT INR: INR: 2

## 2013-08-01 ENCOUNTER — Ambulatory Visit (INDEPENDENT_AMBULATORY_CARE_PROVIDER_SITE_OTHER): Payer: Medicare Other | Admitting: *Deleted

## 2013-08-01 DIAGNOSIS — Z5181 Encounter for therapeutic drug level monitoring: Secondary | ICD-10-CM

## 2013-08-01 DIAGNOSIS — Z7901 Long term (current) use of anticoagulants: Secondary | ICD-10-CM

## 2013-08-01 DIAGNOSIS — I4891 Unspecified atrial fibrillation: Secondary | ICD-10-CM

## 2013-08-01 LAB — POCT INR: INR: 1.7

## 2013-08-09 ENCOUNTER — Ambulatory Visit (INDEPENDENT_AMBULATORY_CARE_PROVIDER_SITE_OTHER): Payer: Medicare Other | Admitting: Cardiology

## 2013-08-09 ENCOUNTER — Encounter: Payer: Self-pay | Admitting: Cardiology

## 2013-08-09 VITALS — BP 140/80 | HR 86 | Ht 63.0 in | Wt 93.1 lb

## 2013-08-09 DIAGNOSIS — F17200 Nicotine dependence, unspecified, uncomplicated: Secondary | ICD-10-CM

## 2013-08-09 DIAGNOSIS — I10 Essential (primary) hypertension: Secondary | ICD-10-CM

## 2013-08-09 DIAGNOSIS — I4891 Unspecified atrial fibrillation: Secondary | ICD-10-CM

## 2013-08-09 NOTE — Assessment & Plan Note (Signed)
Patient counseled on discontinuing. 

## 2013-08-09 NOTE — Assessment & Plan Note (Signed)
Patient remains in sinus rhythm. Continue Cardizem and Coumadin. Check hemoglobin. She is not interested in NOAC due to cost.

## 2013-08-09 NOTE — Progress Notes (Signed)
HPI: FU paroxysmal atrial fibrillation. A Myoview performed on January 19, 2008 revealed inferoseptal thinning, but there was no scar or ischemia. Her ejection fraction was 76%. Note, her LV function was normal by echocardiogram performed on November 29, 2007 with an ejection fraction of 60-70%. A previous TSH was normal. Abdominal ultrasound was normal with no aneurysm in September 2009. Carotid Dopplers were normal in September 2009. Patient last seen in May 2014. Since then, She has some dyspnea on exertion which is unchanged. No orthopnea, PND, pedal edema, syncope or chest pain.   Current Outpatient Prescriptions  Medication Sig Dispense Refill  . albuterol (PROVENTIL HFA) 108 (90 BASE) MCG/ACT inhaler Inhale 2 puffs into the lungs every 4 (four) hours as needed for wheezing. Inhale 2 puff using inhaler every four hours  1 Inhaler  3  . Blood Pressure Monitoring (BLOOD PRESSURE MONITOR/WRIST) DEVI 1 Device by Does not apply route once.  1 Device  0  . diltiazem (CARDIZEM) 90 MG tablet Take 1 tablet (90 mg total) by mouth 4 (four) times daily.  120 tablet  11  . Fluticasone-Salmeterol (ADVAIR DISKUS) 500-50 MCG/DOSE AEPB Inhale 1 puff into the lungs 2 (two) times daily.  60 each  11  . hydrochlorothiazide (MICROZIDE) 12.5 MG capsule Take 1 capsule (12.5 mg total) by mouth daily. For high blood pressure.  30 capsule  11  . ipratropium (ATROVENT HFA) 17 MCG/ACT inhaler Inhale 2 puffs into the lungs every 6 (six) hours.  1 Inhaler  5  . lisinopril (PRINIVIL,ZESTRIL) 10 MG tablet Take 1 tablet (10 mg total) by mouth daily.  30 tablet  11  . warfarin (COUMADIN) 5 MG tablet TAKE ONE TABLET BY MOUTH AS DIRECTED BY COUMADIN CLINIC  60 tablet  3  . albuterol (PROVENTIL) (2.5 MG/3ML) 0.083% nebulizer solution Take 3 mLs (2.5 mg total) by nebulization every 6 (six) hours as needed for wheezing.  75 mL  12   No current facility-administered medications for this visit.     Past Medical History    Diagnosis Date  . VAGINITIS, ATROPHIC 05/06/2006    Qualifier: Diagnosis of  By: Haydee SalterKivett, Whitney    . ZENKER'S DIVERTICULUM 10/04/2006    Qualifier: Diagnosis of  By: Georgiana ShoreMayans  MD, Vernona RiegerLaura    . Atrial fibrillation   . COPD (chronic obstructive pulmonary disease)   . Hypertension     History reviewed. No pertinent past surgical history.  History   Social History  . Marital Status: Married    Spouse Name: N/A    Number of Children: N/A  . Years of Education: N/A   Occupational History  . Not on file.   Social History Main Topics  . Smoking status: Current Every Day Smoker -- 1.00 packs/day    Types: Cigarettes  . Smokeless tobacco: Not on file     Comment: would like to try to quitt after 54 years!!!!!  . Alcohol Use: Not on file  . Drug Use: Not on file  . Sexual Activity: Not on file   Other Topics Concern  . Not on file   Social History Narrative  . No narrative on file    ROS: no fevers or chills, productive cough, hemoptysis, dysphasia, odynophagia, melena, hematochezia, dysuria, hematuria, rash, seizure activity, orthopnea, PND, pedal edema, claudication. Remaining systems are negative.  Physical Exam: Well-developed frail in no acute distress.  Skin is warm and dry.  HEENT is normal.  Neck is supple.  Chest with diminished BS  throughout Cardiovascular exam is regular rate and rhythm.  Abdominal exam nontender or distended. No masses palpated. Extremities show no edema. neuro grossly intact  ECG Sinus rhythm with occasional PACs. Normal axis. No ST changes.

## 2013-08-09 NOTE — Assessment & Plan Note (Signed)
Blood pressure controlled. Continue present medications. Check potassium and renal function. 

## 2013-08-09 NOTE — Patient Instructions (Signed)
Your physician wants you to follow-up in: ONE YEAR WITH DR CRENSHAW You will receive a reminder letter in the mail two months in advance. If you don't receive a letter, please call our office to schedule the follow-up appointment.   Your physician recommends that you HAVE LAB WORK TODAY 

## 2013-08-10 LAB — CBC
HEMATOCRIT: 45.5 % (ref 36.0–46.0)
HEMOGLOBIN: 15.5 g/dL — AB (ref 12.0–15.0)
MCH: 31.6 pg (ref 26.0–34.0)
MCHC: 34.1 g/dL (ref 30.0–36.0)
MCV: 92.9 fL (ref 78.0–100.0)
Platelets: 368 10*3/uL (ref 150–400)
RBC: 4.9 MIL/uL (ref 3.87–5.11)
RDW: 13.6 % (ref 11.5–15.5)
WBC: 8.7 10*3/uL (ref 4.0–10.5)

## 2013-08-10 LAB — BASIC METABOLIC PANEL WITH GFR
BUN: 12 mg/dL (ref 6–23)
CO2: 29 mEq/L (ref 19–32)
CREATININE: 0.82 mg/dL (ref 0.50–1.10)
Calcium: 9.9 mg/dL (ref 8.4–10.5)
Chloride: 89 mEq/L — ABNORMAL LOW (ref 96–112)
GFR, EST NON AFRICAN AMERICAN: 72 mL/min
GFR, Est African American: 83 mL/min
GLUCOSE: 120 mg/dL — AB (ref 70–99)
POTASSIUM: 5.1 meq/L (ref 3.5–5.3)
Sodium: 127 mEq/L — ABNORMAL LOW (ref 135–145)

## 2013-08-16 ENCOUNTER — Ambulatory Visit (INDEPENDENT_AMBULATORY_CARE_PROVIDER_SITE_OTHER): Payer: Medicare Other | Admitting: *Deleted

## 2013-08-16 DIAGNOSIS — Z5181 Encounter for therapeutic drug level monitoring: Secondary | ICD-10-CM

## 2013-08-16 DIAGNOSIS — I4891 Unspecified atrial fibrillation: Secondary | ICD-10-CM

## 2013-08-16 DIAGNOSIS — Z7901 Long term (current) use of anticoagulants: Secondary | ICD-10-CM

## 2013-08-16 LAB — POCT INR: INR: 4.1

## 2013-08-24 ENCOUNTER — Ambulatory Visit (INDEPENDENT_AMBULATORY_CARE_PROVIDER_SITE_OTHER): Payer: Medicare Other | Admitting: *Deleted

## 2013-08-24 DIAGNOSIS — I4891 Unspecified atrial fibrillation: Secondary | ICD-10-CM

## 2013-08-24 DIAGNOSIS — Z7901 Long term (current) use of anticoagulants: Secondary | ICD-10-CM

## 2013-08-24 DIAGNOSIS — Z5181 Encounter for therapeutic drug level monitoring: Secondary | ICD-10-CM

## 2013-08-24 LAB — POCT INR: INR: 2.3

## 2013-08-28 ENCOUNTER — Other Ambulatory Visit: Payer: Self-pay | Admitting: *Deleted

## 2013-08-28 DIAGNOSIS — E871 Hypo-osmolality and hyponatremia: Secondary | ICD-10-CM

## 2013-09-04 ENCOUNTER — Other Ambulatory Visit: Payer: Self-pay | Admitting: Family Medicine

## 2013-09-07 ENCOUNTER — Ambulatory Visit (INDEPENDENT_AMBULATORY_CARE_PROVIDER_SITE_OTHER): Payer: Medicare Other | Admitting: Surgery

## 2013-09-07 DIAGNOSIS — I4891 Unspecified atrial fibrillation: Secondary | ICD-10-CM

## 2013-09-07 DIAGNOSIS — Z5181 Encounter for therapeutic drug level monitoring: Secondary | ICD-10-CM

## 2013-09-07 DIAGNOSIS — Z7901 Long term (current) use of anticoagulants: Secondary | ICD-10-CM

## 2013-09-07 LAB — POCT INR: INR: 3.9

## 2013-09-14 ENCOUNTER — Other Ambulatory Visit: Payer: Self-pay | Admitting: Family Medicine

## 2013-09-18 ENCOUNTER — Ambulatory Visit (INDEPENDENT_AMBULATORY_CARE_PROVIDER_SITE_OTHER): Payer: Medicare Other | Admitting: Pharmacist

## 2013-09-18 ENCOUNTER — Telehealth: Payer: Self-pay | Admitting: Cardiology

## 2013-09-18 DIAGNOSIS — Z7901 Long term (current) use of anticoagulants: Secondary | ICD-10-CM

## 2013-09-18 DIAGNOSIS — I4891 Unspecified atrial fibrillation: Secondary | ICD-10-CM

## 2013-09-18 DIAGNOSIS — Z5181 Encounter for therapeutic drug level monitoring: Secondary | ICD-10-CM

## 2013-09-18 LAB — POCT INR: INR: 5.3

## 2013-09-18 NOTE — Telephone Encounter (Signed)
Hemorrhoid flare up since Thursday PM with increasing bleeding since.  Last pm while sleeping bled through clothes and bedding.  Per Audrie LiaSally Earl, PharmD come today at 3 for INR.  Patient notified and voiced understanding..Marland Kitchen

## 2013-09-18 NOTE — Telephone Encounter (Signed)
°  Patient takes coumadin and since Friday 09/15/13 she's had excessive bleeding from her hemorids. She is worried and don't know what to do. Please call and advise.

## 2013-09-20 ENCOUNTER — Other Ambulatory Visit: Payer: Self-pay | Admitting: Family Medicine

## 2013-09-20 DIAGNOSIS — I10 Essential (primary) hypertension: Secondary | ICD-10-CM

## 2013-09-20 MED ORDER — DILTIAZEM HCL 90 MG PO TABS
90.0000 mg | ORAL_TABLET | Freq: Four times a day (QID) | ORAL | Status: DC
Start: 2013-09-20 — End: 2014-12-17

## 2013-09-20 NOTE — Telephone Encounter (Signed)
Refilled Cardizem.   Myra RudeJeremy E Brian Zeitlin, MD PGY-2, [facility] 09/20/2013, 5:06 PM

## 2013-09-26 ENCOUNTER — Ambulatory Visit (INDEPENDENT_AMBULATORY_CARE_PROVIDER_SITE_OTHER): Payer: Medicare Other

## 2013-09-26 DIAGNOSIS — Z5181 Encounter for therapeutic drug level monitoring: Secondary | ICD-10-CM

## 2013-09-26 DIAGNOSIS — I4891 Unspecified atrial fibrillation: Secondary | ICD-10-CM

## 2013-09-26 DIAGNOSIS — Z7901 Long term (current) use of anticoagulants: Secondary | ICD-10-CM

## 2013-09-26 LAB — POCT INR: INR: 3.2

## 2013-10-10 ENCOUNTER — Ambulatory Visit (INDEPENDENT_AMBULATORY_CARE_PROVIDER_SITE_OTHER): Payer: Medicare Other | Admitting: Pharmacist

## 2013-10-10 DIAGNOSIS — Z7901 Long term (current) use of anticoagulants: Secondary | ICD-10-CM

## 2013-10-10 DIAGNOSIS — Z5181 Encounter for therapeutic drug level monitoring: Secondary | ICD-10-CM

## 2013-10-10 DIAGNOSIS — I4891 Unspecified atrial fibrillation: Secondary | ICD-10-CM

## 2013-10-10 LAB — POCT INR: INR: 2.6

## 2013-10-24 ENCOUNTER — Ambulatory Visit (INDEPENDENT_AMBULATORY_CARE_PROVIDER_SITE_OTHER): Payer: Medicare Other | Admitting: Pharmacist

## 2013-10-24 DIAGNOSIS — I4891 Unspecified atrial fibrillation: Secondary | ICD-10-CM

## 2013-10-24 DIAGNOSIS — Z7901 Long term (current) use of anticoagulants: Secondary | ICD-10-CM

## 2013-10-24 DIAGNOSIS — Z5181 Encounter for therapeutic drug level monitoring: Secondary | ICD-10-CM

## 2013-10-24 LAB — POCT INR: INR: 1.9

## 2013-11-03 ENCOUNTER — Other Ambulatory Visit: Payer: Self-pay | Admitting: Family Medicine

## 2013-11-03 DIAGNOSIS — J449 Chronic obstructive pulmonary disease, unspecified: Secondary | ICD-10-CM

## 2013-11-03 MED ORDER — ALBUTEROL SULFATE (2.5 MG/3ML) 0.083% IN NEBU: 2.5000 mg | INHALATION_SOLUTION | Freq: Four times a day (QID) | RESPIRATORY_TRACT | Status: DC | PRN

## 2013-11-03 NOTE — Telephone Encounter (Signed)
Upon review of her last office visit. Still on atrovent and albuterol nebulizer/inhaler.  Fills scripts at health department [facility] [phone]) but the health department cannot fill nebulizer solutions. Will send script to  [facility].   Called [facility] and spoke with them about her scripts. Last fill albuterol inhaler July 7.    Myra Rude, MD PGY-2, [facility] 11/03/2013, 11:29 AM

## 2013-11-15 ENCOUNTER — Encounter: Payer: Self-pay | Admitting: Family Medicine

## 2013-11-15 ENCOUNTER — Ambulatory Visit (INDEPENDENT_AMBULATORY_CARE_PROVIDER_SITE_OTHER): Payer: Medicare Other | Admitting: Family Medicine

## 2013-11-15 ENCOUNTER — Ambulatory Visit (INDEPENDENT_AMBULATORY_CARE_PROVIDER_SITE_OTHER): Payer: Medicare Other | Admitting: *Deleted

## 2013-11-15 VITALS — BP 155/68 | HR 91 | Temp 97.7°F | Ht 62.5 in | Wt 92.6 lb

## 2013-11-15 DIAGNOSIS — Z23 Encounter for immunization: Secondary | ICD-10-CM

## 2013-11-15 DIAGNOSIS — Z7901 Long term (current) use of anticoagulants: Secondary | ICD-10-CM

## 2013-11-15 DIAGNOSIS — I4891 Unspecified atrial fibrillation: Secondary | ICD-10-CM

## 2013-11-15 DIAGNOSIS — I10 Essential (primary) hypertension: Secondary | ICD-10-CM

## 2013-11-15 DIAGNOSIS — J449 Chronic obstructive pulmonary disease, unspecified: Secondary | ICD-10-CM

## 2013-11-15 DIAGNOSIS — J441 Chronic obstructive pulmonary disease with (acute) exacerbation: Secondary | ICD-10-CM

## 2013-11-15 DIAGNOSIS — Z5181 Encounter for therapeutic drug level monitoring: Secondary | ICD-10-CM

## 2013-11-15 DIAGNOSIS — F17200 Nicotine dependence, unspecified, uncomplicated: Secondary | ICD-10-CM

## 2013-11-15 LAB — CBC
HCT: 47.1 % — ABNORMAL HIGH (ref 36.0–46.0)
Hemoglobin: 16.7 g/dL — ABNORMAL HIGH (ref 12.0–15.0)
MCH: 32.9 pg (ref 26.0–34.0)
MCHC: 35.5 g/dL (ref 30.0–36.0)
MCV: 92.9 fL (ref 78.0–100.0)
PLATELETS: 341 10*3/uL (ref 150–400)
RBC: 5.07 MIL/uL (ref 3.87–5.11)
RDW: 13.6 % (ref 11.5–15.5)
WBC: 6.8 10*3/uL (ref 4.0–10.5)

## 2013-11-15 LAB — POCT INR: INR: 2.1

## 2013-11-15 MED ORDER — ALBUTEROL SULFATE HFA 108 (90 BASE) MCG/ACT IN AERS: 2.0000 | INHALATION_SPRAY | RESPIRATORY_TRACT | Status: DC | PRN

## 2013-11-15 MED ORDER — ALBUTEROL SULFATE (2.5 MG/3ML) 0.083% IN NEBU: 2.5000 mg | INHALATION_SOLUTION | Freq: Four times a day (QID) | RESPIRATORY_TRACT | Status: DC | PRN

## 2013-11-15 MED ORDER — FLUTICASONE-SALMETEROL 500-50 MCG/DOSE IN AEPB: 1.0000 | INHALATION_SPRAY | Freq: Two times a day (BID) | RESPIRATORY_TRACT | Status: DC

## 2013-11-15 MED ORDER — LISINOPRIL 10 MG PO TABS: 10.0000 mg | ORAL_TABLET | Freq: Every day | ORAL | Status: DC

## 2013-11-15 NOTE — Assessment & Plan Note (Signed)
Not checking BPs at home. Isolated systolic elevation without having taken lisinopril this morning, so will not make changes. Checking renal function, electrolytes (has been off HCTZ due to hyponatremia for 3 months). No symptoms.

## 2013-11-15 NOTE — Progress Notes (Signed)
Patient ID: [name], female   DOB: February 21, 1941, 72 y.o.   MRN: [id number]   Subjective:  [name] is a 73 y.o. female here for medication refill.  She reports no new symptoms, and has continued to taker advair and atrovent daily with sparing use of albuterol nebulizer (at home) and inhaler (when traveling). She has unchanged dyspnea and sees Dr. Jens Som for management of atrial fibrillation. Due to hyponatremia she was taken off HCTZ in June. She has yet to take her BP medications this morning but denies HA, vision changes, chest pain, palpitations, bruising. bleeding, orthopnea, leg swelling.   All other pertinent systems reviewed and are negative. Objective:  BP 155/68  Pulse 91  Temp(Src) 97.7 F (36.5 C) (Oral)  Ht 5' 2.5" (1.588 m)  Wt 92 lb 9.6 oz (42.003 kg)  BMI 16.66 kg/m2  Gen: 73 y.o. female appearing older than stated age in NAD HEENT: MMM, EOMI, PERRL, anicteric sclerae CV: RRR, no MRG, no JVD Resp: Non-labored, scattered mild wheezes noted with background of diminished breath sounds. Skin: Photodermatoses throughout  Assessment:  [name] is a 73 y.o. female here for medication refill.  Plan:  See problem list for problem-specific plans. - Refilled inhalers (printed as these go to HD) and lisinopril. - Monitoring labs ordered (CBC, BMP). - Flu shot given today.

## 2013-11-15 NOTE — Patient Instructions (Signed)
Thank you for coming in today!  I have refilled your medications and we are checking some labs today, and I will call you if they are abnormal. If you do not hear from me by phone or letter in 2 weeks, please call us as I may have been unable to reach you.   As you leave, make an appointment to follow up with Dr. Jordan Likes in 6 months.  - Bring all medications in a bag to your visits.  Take care and seek immediate care sooner if you develop any concerns.  Please feel free to call with any questions or concerns at any time, at [phone]. - Dr. Jarvis Newcomer

## 2013-11-16 ENCOUNTER — Encounter: Payer: Self-pay | Admitting: Family Medicine

## 2013-11-16 LAB — BASIC METABOLIC PANEL
BUN: 11 mg/dL (ref 6–23)
CO2: 30 mEq/L (ref 19–32)
Calcium: 9.9 mg/dL (ref 8.4–10.5)
Chloride: 99 mEq/L (ref 96–112)
Creat: 0.69 mg/dL (ref 0.50–1.10)
GLUCOSE: 128 mg/dL — AB (ref 70–99)
Potassium: 4.5 mEq/L (ref 3.5–5.3)
Sodium: 137 mEq/L (ref 135–145)

## 2013-12-13 ENCOUNTER — Ambulatory Visit (INDEPENDENT_AMBULATORY_CARE_PROVIDER_SITE_OTHER): Payer: Medicare Other | Admitting: Pharmacist

## 2013-12-13 DIAGNOSIS — Z7901 Long term (current) use of anticoagulants: Secondary | ICD-10-CM

## 2013-12-13 DIAGNOSIS — Z5181 Encounter for therapeutic drug level monitoring: Secondary | ICD-10-CM

## 2013-12-13 DIAGNOSIS — I4891 Unspecified atrial fibrillation: Secondary | ICD-10-CM

## 2013-12-13 LAB — POCT INR: INR: 4.4

## 2013-12-27 ENCOUNTER — Ambulatory Visit (INDEPENDENT_AMBULATORY_CARE_PROVIDER_SITE_OTHER): Payer: Medicare Other | Admitting: Pharmacist

## 2013-12-27 DIAGNOSIS — I4891 Unspecified atrial fibrillation: Secondary | ICD-10-CM

## 2013-12-27 DIAGNOSIS — Z5181 Encounter for therapeutic drug level monitoring: Secondary | ICD-10-CM

## 2013-12-27 DIAGNOSIS — Z7901 Long term (current) use of anticoagulants: Secondary | ICD-10-CM

## 2013-12-27 LAB — POCT INR: INR: 2.3

## 2014-01-17 ENCOUNTER — Ambulatory Visit (INDEPENDENT_AMBULATORY_CARE_PROVIDER_SITE_OTHER): Payer: Medicare Other | Admitting: *Deleted

## 2014-01-17 DIAGNOSIS — Z7901 Long term (current) use of anticoagulants: Secondary | ICD-10-CM

## 2014-01-17 DIAGNOSIS — I4891 Unspecified atrial fibrillation: Secondary | ICD-10-CM

## 2014-01-17 DIAGNOSIS — Z5181 Encounter for therapeutic drug level monitoring: Secondary | ICD-10-CM

## 2014-01-17 LAB — POCT INR: INR: 2.5

## 2014-02-19 ENCOUNTER — Ambulatory Visit (INDEPENDENT_AMBULATORY_CARE_PROVIDER_SITE_OTHER): Payer: Medicare Other | Admitting: *Deleted

## 2014-02-19 DIAGNOSIS — I4891 Unspecified atrial fibrillation: Secondary | ICD-10-CM

## 2014-02-19 DIAGNOSIS — Z5181 Encounter for therapeutic drug level monitoring: Secondary | ICD-10-CM

## 2014-02-19 DIAGNOSIS — Z7901 Long term (current) use of anticoagulants: Secondary | ICD-10-CM

## 2014-02-19 LAB — POCT INR: INR: 1.4

## 2014-03-05 ENCOUNTER — Ambulatory Visit (INDEPENDENT_AMBULATORY_CARE_PROVIDER_SITE_OTHER): Payer: Medicare Other | Admitting: Pharmacist

## 2014-03-05 DIAGNOSIS — Z7901 Long term (current) use of anticoagulants: Secondary | ICD-10-CM

## 2014-03-05 DIAGNOSIS — Z5181 Encounter for therapeutic drug level monitoring: Secondary | ICD-10-CM

## 2014-03-05 DIAGNOSIS — I4891 Unspecified atrial fibrillation: Secondary | ICD-10-CM

## 2014-03-05 LAB — POCT INR: INR: 1.9

## 2014-03-19 ENCOUNTER — Ambulatory Visit (INDEPENDENT_AMBULATORY_CARE_PROVIDER_SITE_OTHER): Payer: Medicare Other | Admitting: Pharmacist

## 2014-03-19 DIAGNOSIS — I4891 Unspecified atrial fibrillation: Secondary | ICD-10-CM

## 2014-03-19 DIAGNOSIS — Z5181 Encounter for therapeutic drug level monitoring: Secondary | ICD-10-CM

## 2014-03-19 DIAGNOSIS — Z7901 Long term (current) use of anticoagulants: Secondary | ICD-10-CM | POA: Diagnosis not present

## 2014-03-19 LAB — POCT INR: INR: 7.6

## 2014-03-19 LAB — PROTIME-INR
INR: 7.6 ratio (ref 0.8–1.0)
Prothrombin Time: 79.4 s (ref 9.6–13.1)

## 2014-03-23 ENCOUNTER — Ambulatory Visit (INDEPENDENT_AMBULATORY_CARE_PROVIDER_SITE_OTHER): Payer: Medicare Other

## 2014-03-23 DIAGNOSIS — Z5181 Encounter for therapeutic drug level monitoring: Secondary | ICD-10-CM | POA: Diagnosis not present

## 2014-03-23 DIAGNOSIS — Z7901 Long term (current) use of anticoagulants: Secondary | ICD-10-CM

## 2014-03-23 DIAGNOSIS — I4891 Unspecified atrial fibrillation: Secondary | ICD-10-CM | POA: Diagnosis not present

## 2014-03-23 LAB — POCT INR: INR: 1.2

## 2014-04-05 DIAGNOSIS — H2511 Age-related nuclear cataract, right eye: Secondary | ICD-10-CM | POA: Diagnosis not present

## 2014-04-05 DIAGNOSIS — H538 Other visual disturbances: Secondary | ICD-10-CM | POA: Diagnosis not present

## 2014-04-11 ENCOUNTER — Ambulatory Visit (INDEPENDENT_AMBULATORY_CARE_PROVIDER_SITE_OTHER): Payer: Medicare Other | Admitting: *Deleted

## 2014-04-11 DIAGNOSIS — I4891 Unspecified atrial fibrillation: Secondary | ICD-10-CM

## 2014-04-11 DIAGNOSIS — Z7901 Long term (current) use of anticoagulants: Secondary | ICD-10-CM | POA: Diagnosis not present

## 2014-04-11 DIAGNOSIS — Z5181 Encounter for therapeutic drug level monitoring: Secondary | ICD-10-CM

## 2014-04-11 LAB — POCT INR: INR: 8

## 2014-04-11 LAB — PROTIME-INR
INR: 8.4 ratio (ref 0.8–1.0)
Prothrombin Time: 87.3 s (ref 9.6–13.1)

## 2014-04-12 ENCOUNTER — Ambulatory Visit: Payer: Medicare Other | Admitting: Family Medicine

## 2014-04-13 ENCOUNTER — Ambulatory Visit (INDEPENDENT_AMBULATORY_CARE_PROVIDER_SITE_OTHER): Payer: Medicare Other | Admitting: Family Medicine

## 2014-04-13 ENCOUNTER — Encounter: Payer: Self-pay | Admitting: Family Medicine

## 2014-04-13 VITALS — BP 149/84 | HR 99 | Temp 98.1°F | Wt 90.1 lb

## 2014-04-13 DIAGNOSIS — B354 Tinea corporis: Secondary | ICD-10-CM

## 2014-04-13 DIAGNOSIS — B356 Tinea cruris: Secondary | ICD-10-CM | POA: Diagnosis not present

## 2014-04-13 MED ORDER — CLOTRIMAZOLE 1 % EX CREA: TOPICAL_CREAM | CUTANEOUS | Status: DC

## 2014-04-13 NOTE — Patient Instructions (Signed)
Thank you for coming in, today!  I think your rash is ringworm, also called "tinea," which is a fungal infection. Use Lotrimin cream twice a day until the rash clears up, then once daily for a week after it's gone. Give the cream at least a couple of weeks to work. If things are no better (and definitely if they get worse), call or come back sooner rather than later. Otherwise, come back to see Dr. Jordan LikesSchmitz as you need.  Please feel free to call with any questions or concerns at any time, at [phone]. --Dr. Casper HarrisonStreet

## 2014-04-13 NOTE — Progress Notes (Signed)
Subjective:    Patient ID: [name], female    DOB: [birth year], 74 y.o.   MRN: [id number]  HPI: Pt presents to clinic for SDA with complaint of rash on the back of her left thigh and "on her genitals," with pt concerned for ringworm. The rash on her genital area is red, very itchy, and has been present for about 3 days. She is unsure how long the rash on the back of her leg has been there, but it was there first. She took a "sit down bath" to try to help alleviate the rash on her leg and the rash to her genital area erupted the next day; she feels like the rash spread due to the bath. She does not have any internal itching / rash and has no vaginal discharge or bleeding.  She has not tried any medications for either spot of rash, but has had ringworm in the past (though not on her genital area), and states that Lotrimin cleared it up ("it just took a long time to do it"). She has no other rashes and has not had any contact with anyone that she knows of who has rashes. Her husband does not have any similar rashes. She has no itching anywhere else, and no rash on her abdomen or on her hands or feet.  Of note, after initial portion of exam (see below), pt was questioned about sexual activity and any history of STI. She has not had intercourse "in years" and has no known history of STI's. She specifically denies ever having had genital herpes.  Review of Systems: As above. Generally otherwise is well and denies fever / chills, N/V, change in appetite, though she does report stress at home "all the time" (pt cares for her husband, who has Alzheimer's type dementia).     Objective:   Physical Exam - Exam conducted with female CMA chaperone BP 149/84 mmHg  Pulse 99  Temp(Src) 98.1 F (36.7 C) (Oral)  Wt 90 lb 1.6 oz (40.869 kg) Gen: well but very thin-appearing elderly female in NAD HEENT: [place]/AT, EOMI, PERRLA Pulm: normal WOB Ext: red, raised, irregularly-bordered rash to posterior left  thigh  No broken skin, induration, open sores, or drainage; no tenderness, but increased pruritis present with palpation  Rash not distinctly vesicular in nature, with some satellite lesions consistent with tinea  very thin but no frank rashes noted, otherwise GU: very similar rash as above, present to groin in mons area and vulva, right slightly worse than left  This rash more vesicular in nature but not typically grouped, also with no frank open skin, drainage, induration, or tenderness  Overall also consistent with tinea, with red, raised lesions, irregular border, pruritic to touch, and with satellite lesions     Assessment & Plan:  74yo female with rash to her vulvar area and left posterior thigh, with history and exam overall most consistent with tinea - vulvar rash in some ways suggestive of herpetic lesions but pt has no known history of herpes infection and has not had sexual contact in the recent past - Rx given for Lotrimin cream with instructions to use BID until rash clears, then once daily for 1 additional week - instructed to f/u with PCP Dr. Jordan Likes if rash is no better or if it worsens; instructed to allow at least 2 weeks for medication effect - generally recommended good hand and personal hygiene, otherwise  Note FYI to Dr. Jordan Likes.  Bobbye Morton, MD PGY-3,  [facility] 04/13/2014, 3:45 PM

## 2014-04-16 ENCOUNTER — Ambulatory Visit (INDEPENDENT_AMBULATORY_CARE_PROVIDER_SITE_OTHER): Payer: Medicare Other

## 2014-04-16 DIAGNOSIS — Z7901 Long term (current) use of anticoagulants: Secondary | ICD-10-CM

## 2014-04-16 DIAGNOSIS — I4891 Unspecified atrial fibrillation: Secondary | ICD-10-CM

## 2014-04-16 DIAGNOSIS — Z5181 Encounter for therapeutic drug level monitoring: Secondary | ICD-10-CM | POA: Diagnosis not present

## 2014-04-16 LAB — POCT INR: INR: 1.1

## 2014-04-19 DIAGNOSIS — H2513 Age-related nuclear cataract, bilateral: Secondary | ICD-10-CM | POA: Diagnosis not present

## 2014-04-26 ENCOUNTER — Ambulatory Visit (INDEPENDENT_AMBULATORY_CARE_PROVIDER_SITE_OTHER): Payer: Medicare Other | Admitting: *Deleted

## 2014-04-26 DIAGNOSIS — Z7901 Long term (current) use of anticoagulants: Secondary | ICD-10-CM

## 2014-04-26 DIAGNOSIS — I4891 Unspecified atrial fibrillation: Secondary | ICD-10-CM | POA: Diagnosis not present

## 2014-04-26 DIAGNOSIS — Z5181 Encounter for therapeutic drug level monitoring: Secondary | ICD-10-CM | POA: Diagnosis not present

## 2014-04-26 LAB — POCT INR: INR: 2

## 2014-04-30 DIAGNOSIS — H2512 Age-related nuclear cataract, left eye: Secondary | ICD-10-CM | POA: Diagnosis not present

## 2014-05-01 ENCOUNTER — Other Ambulatory Visit: Payer: Self-pay | Admitting: *Deleted

## 2014-05-01 DIAGNOSIS — J441 Chronic obstructive pulmonary disease with (acute) exacerbation: Secondary | ICD-10-CM

## 2014-05-01 MED ORDER — IPRATROPIUM BROMIDE HFA 17 MCG/ACT IN AERS: 2.0000 | INHALATION_SPRAY | Freq: Four times a day (QID) | RESPIRATORY_TRACT | Status: DC

## 2014-05-02 MED ORDER — IPRATROPIUM BROMIDE HFA 17 MCG/ACT IN AERS: 2.0000 | INHALATION_SPRAY | Freq: Four times a day (QID) | RESPIRATORY_TRACT | Status: DC

## 2014-05-02 NOTE — Addendum Note (Signed)
Addended by: Clovis PuMARTIN, TAMIKA L on: 05/02/2014 08:17 AM   Modules accepted: Orders

## 2014-05-02 NOTE — Telephone Encounter (Signed)
Medication was approved, but stated No Print.  Resent via fax.  Clovis PuMartin, Flo Berroa L, RN

## 2014-05-09 DIAGNOSIS — H25812 Combined forms of age-related cataract, left eye: Secondary | ICD-10-CM | POA: Diagnosis not present

## 2014-05-09 DIAGNOSIS — H2512 Age-related nuclear cataract, left eye: Secondary | ICD-10-CM | POA: Diagnosis not present

## 2014-05-11 ENCOUNTER — Ambulatory Visit (INDEPENDENT_AMBULATORY_CARE_PROVIDER_SITE_OTHER): Payer: Medicare Other | Admitting: Surgery

## 2014-05-11 DIAGNOSIS — Z5181 Encounter for therapeutic drug level monitoring: Secondary | ICD-10-CM | POA: Diagnosis not present

## 2014-05-11 DIAGNOSIS — I4891 Unspecified atrial fibrillation: Secondary | ICD-10-CM

## 2014-05-11 DIAGNOSIS — Z7901 Long term (current) use of anticoagulants: Secondary | ICD-10-CM

## 2014-05-11 LAB — POCT INR: INR: 1.8

## 2014-05-17 DIAGNOSIS — H2511 Age-related nuclear cataract, right eye: Secondary | ICD-10-CM | POA: Diagnosis not present

## 2014-05-23 DIAGNOSIS — H25811 Combined forms of age-related cataract, right eye: Secondary | ICD-10-CM | POA: Diagnosis not present

## 2014-05-23 DIAGNOSIS — H2511 Age-related nuclear cataract, right eye: Secondary | ICD-10-CM | POA: Diagnosis not present

## 2014-05-28 ENCOUNTER — Ambulatory Visit (INDEPENDENT_AMBULATORY_CARE_PROVIDER_SITE_OTHER): Payer: Medicare Other | Admitting: *Deleted

## 2014-05-28 DIAGNOSIS — I4891 Unspecified atrial fibrillation: Secondary | ICD-10-CM

## 2014-05-28 DIAGNOSIS — Z7901 Long term (current) use of anticoagulants: Secondary | ICD-10-CM | POA: Diagnosis not present

## 2014-05-28 DIAGNOSIS — Z5181 Encounter for therapeutic drug level monitoring: Secondary | ICD-10-CM

## 2014-05-28 LAB — POCT INR: INR: 2.9

## 2014-06-18 ENCOUNTER — Ambulatory Visit (INDEPENDENT_AMBULATORY_CARE_PROVIDER_SITE_OTHER): Payer: Medicare Other | Admitting: *Deleted

## 2014-06-18 DIAGNOSIS — Z7901 Long term (current) use of anticoagulants: Secondary | ICD-10-CM | POA: Diagnosis not present

## 2014-06-18 DIAGNOSIS — Z5181 Encounter for therapeutic drug level monitoring: Secondary | ICD-10-CM

## 2014-06-18 DIAGNOSIS — I4891 Unspecified atrial fibrillation: Secondary | ICD-10-CM | POA: Diagnosis not present

## 2014-06-18 LAB — POCT INR: INR: 1.7

## 2014-07-02 ENCOUNTER — Ambulatory Visit (INDEPENDENT_AMBULATORY_CARE_PROVIDER_SITE_OTHER): Payer: Medicare Other | Admitting: Pharmacist

## 2014-07-02 DIAGNOSIS — Z5181 Encounter for therapeutic drug level monitoring: Secondary | ICD-10-CM | POA: Diagnosis not present

## 2014-07-02 DIAGNOSIS — I4891 Unspecified atrial fibrillation: Secondary | ICD-10-CM

## 2014-07-02 DIAGNOSIS — Z7901 Long term (current) use of anticoagulants: Secondary | ICD-10-CM

## 2014-07-02 LAB — POCT INR: INR: 1.8

## 2014-07-16 ENCOUNTER — Ambulatory Visit (INDEPENDENT_AMBULATORY_CARE_PROVIDER_SITE_OTHER): Payer: Medicare Other | Admitting: *Deleted

## 2014-07-16 DIAGNOSIS — I4891 Unspecified atrial fibrillation: Secondary | ICD-10-CM | POA: Diagnosis not present

## 2014-07-16 DIAGNOSIS — Z7901 Long term (current) use of anticoagulants: Secondary | ICD-10-CM | POA: Diagnosis not present

## 2014-07-16 DIAGNOSIS — Z5181 Encounter for therapeutic drug level monitoring: Secondary | ICD-10-CM

## 2014-07-16 LAB — POCT INR: INR: 1.8

## 2014-07-23 ENCOUNTER — Other Ambulatory Visit: Payer: Self-pay | Admitting: Family Medicine

## 2014-07-23 DIAGNOSIS — I4819 Other persistent atrial fibrillation: Secondary | ICD-10-CM

## 2014-07-23 MED ORDER — WARFARIN SODIUM 5 MG PO TABS: ORAL_TABLET | ORAL | Status: DC

## 2014-07-23 NOTE — Telephone Encounter (Signed)
Patient is calling because she will be out of her medication, warfarin (COUMADIN) 5 MG tablet, on Wednesday. She would like a refill as soon as it is possible. Please call the patient and let her know when it is complete. Thanks, HoneywellSadie Kirk, ASA

## 2014-07-30 ENCOUNTER — Ambulatory Visit (INDEPENDENT_AMBULATORY_CARE_PROVIDER_SITE_OTHER): Payer: Medicare Other

## 2014-07-30 DIAGNOSIS — Z5181 Encounter for therapeutic drug level monitoring: Secondary | ICD-10-CM

## 2014-07-30 DIAGNOSIS — Z7901 Long term (current) use of anticoagulants: Secondary | ICD-10-CM

## 2014-07-30 DIAGNOSIS — I4891 Unspecified atrial fibrillation: Secondary | ICD-10-CM | POA: Diagnosis not present

## 2014-07-30 LAB — POCT INR: INR: 3.6

## 2014-08-13 ENCOUNTER — Ambulatory Visit (INDEPENDENT_AMBULATORY_CARE_PROVIDER_SITE_OTHER): Payer: Medicare Other

## 2014-08-13 DIAGNOSIS — I4891 Unspecified atrial fibrillation: Secondary | ICD-10-CM

## 2014-08-13 DIAGNOSIS — Z5181 Encounter for therapeutic drug level monitoring: Secondary | ICD-10-CM | POA: Diagnosis not present

## 2014-08-13 DIAGNOSIS — Z7901 Long term (current) use of anticoagulants: Secondary | ICD-10-CM | POA: Diagnosis not present

## 2014-08-13 LAB — POCT INR: INR: 2.4

## 2014-09-03 ENCOUNTER — Ambulatory Visit (INDEPENDENT_AMBULATORY_CARE_PROVIDER_SITE_OTHER): Payer: Medicare Other | Admitting: *Deleted

## 2014-09-03 DIAGNOSIS — I4891 Unspecified atrial fibrillation: Secondary | ICD-10-CM | POA: Diagnosis not present

## 2014-09-03 DIAGNOSIS — Z7901 Long term (current) use of anticoagulants: Secondary | ICD-10-CM | POA: Diagnosis not present

## 2014-09-03 DIAGNOSIS — Z5181 Encounter for therapeutic drug level monitoring: Secondary | ICD-10-CM

## 2014-09-03 LAB — POCT INR: INR: 3.3

## 2014-09-04 ENCOUNTER — Other Ambulatory Visit: Payer: Self-pay | Admitting: Family Medicine

## 2014-09-12 ENCOUNTER — Other Ambulatory Visit: Payer: Self-pay | Admitting: Family Medicine

## 2014-09-12 DIAGNOSIS — I4891 Unspecified atrial fibrillation: Secondary | ICD-10-CM

## 2014-09-12 NOTE — Telephone Encounter (Signed)
Mrs. Dorene Ardcock is calling because she states the request for this refill was sent last week and she only has one tablet left and she cannot go without. She would like for this medicine to be refilled asap. Please contact the patient to let her know it has been sent. Thank you, Dorothey BasemanSadie Reynolds, ASA

## 2014-09-25 ENCOUNTER — Ambulatory Visit (INDEPENDENT_AMBULATORY_CARE_PROVIDER_SITE_OTHER): Payer: Medicare Other | Admitting: *Deleted

## 2014-09-25 DIAGNOSIS — I4891 Unspecified atrial fibrillation: Secondary | ICD-10-CM | POA: Diagnosis not present

## 2014-09-25 DIAGNOSIS — Z7901 Long term (current) use of anticoagulants: Secondary | ICD-10-CM | POA: Diagnosis not present

## 2014-09-25 DIAGNOSIS — Z5181 Encounter for therapeutic drug level monitoring: Secondary | ICD-10-CM | POA: Diagnosis not present

## 2014-09-25 LAB — POCT INR: INR: 3.2

## 2014-10-09 ENCOUNTER — Ambulatory Visit (INDEPENDENT_AMBULATORY_CARE_PROVIDER_SITE_OTHER): Payer: Medicare Other | Admitting: Pharmacist Clinician (PhC)/ Clinical Pharmacy Specialist

## 2014-10-09 DIAGNOSIS — I4891 Unspecified atrial fibrillation: Secondary | ICD-10-CM

## 2014-10-09 DIAGNOSIS — Z5181 Encounter for therapeutic drug level monitoring: Secondary | ICD-10-CM

## 2014-10-09 DIAGNOSIS — Z7901 Long term (current) use of anticoagulants: Secondary | ICD-10-CM | POA: Diagnosis not present

## 2014-10-09 LAB — POCT INR: INR: 2.9

## 2014-10-30 ENCOUNTER — Ambulatory Visit (INDEPENDENT_AMBULATORY_CARE_PROVIDER_SITE_OTHER): Payer: Medicare Other | Admitting: *Deleted

## 2014-10-30 DIAGNOSIS — I4891 Unspecified atrial fibrillation: Secondary | ICD-10-CM

## 2014-10-30 DIAGNOSIS — Z5181 Encounter for therapeutic drug level monitoring: Secondary | ICD-10-CM

## 2014-10-30 DIAGNOSIS — Z7901 Long term (current) use of anticoagulants: Secondary | ICD-10-CM | POA: Diagnosis not present

## 2014-10-30 LAB — POCT INR: INR: 3

## 2014-11-19 ENCOUNTER — Other Ambulatory Visit: Payer: Self-pay | Admitting: *Deleted

## 2014-11-19 ENCOUNTER — Other Ambulatory Visit: Payer: Self-pay | Admitting: Family Medicine

## 2014-11-19 DIAGNOSIS — J449 Chronic obstructive pulmonary disease, unspecified: Secondary | ICD-10-CM

## 2014-11-19 MED ORDER — FLUTICASONE-SALMETEROL 500-50 MCG/DOSE IN AEPB: 1.0000 | INHALATION_SPRAY | Freq: Two times a day (BID) | RESPIRATORY_TRACT | Status: DC

## 2014-11-27 ENCOUNTER — Ambulatory Visit (INDEPENDENT_AMBULATORY_CARE_PROVIDER_SITE_OTHER): Payer: Medicare Other | Admitting: Pharmacist

## 2014-11-27 DIAGNOSIS — I4891 Unspecified atrial fibrillation: Secondary | ICD-10-CM | POA: Diagnosis not present

## 2014-11-27 DIAGNOSIS — Z5181 Encounter for therapeutic drug level monitoring: Secondary | ICD-10-CM

## 2014-11-27 DIAGNOSIS — Z7901 Long term (current) use of anticoagulants: Secondary | ICD-10-CM | POA: Diagnosis not present

## 2014-11-27 LAB — POCT INR: INR: 1.8

## 2014-12-12 ENCOUNTER — Other Ambulatory Visit: Payer: Self-pay | Admitting: Family Medicine

## 2014-12-17 ENCOUNTER — Other Ambulatory Visit: Payer: Self-pay | Admitting: Family Medicine

## 2014-12-20 ENCOUNTER — Ambulatory Visit (INDEPENDENT_AMBULATORY_CARE_PROVIDER_SITE_OTHER): Payer: Medicare Other | Admitting: *Deleted

## 2014-12-20 DIAGNOSIS — I4891 Unspecified atrial fibrillation: Secondary | ICD-10-CM | POA: Diagnosis not present

## 2014-12-20 DIAGNOSIS — Z7901 Long term (current) use of anticoagulants: Secondary | ICD-10-CM

## 2014-12-20 DIAGNOSIS — Z5181 Encounter for therapeutic drug level monitoring: Secondary | ICD-10-CM

## 2014-12-20 LAB — POCT INR: INR: 1.9

## 2014-12-31 ENCOUNTER — Encounter: Payer: Self-pay | Admitting: Family Medicine

## 2014-12-31 ENCOUNTER — Ambulatory Visit (INDEPENDENT_AMBULATORY_CARE_PROVIDER_SITE_OTHER): Payer: Medicare Other | Admitting: Family Medicine

## 2014-12-31 VITALS — BP 130/72 | HR 66 | Temp 98.0°F | Ht 63.0 in | Wt 90.0 lb

## 2014-12-31 DIAGNOSIS — Z23 Encounter for immunization: Secondary | ICD-10-CM | POA: Diagnosis not present

## 2014-12-31 DIAGNOSIS — I482 Chronic atrial fibrillation, unspecified: Secondary | ICD-10-CM

## 2014-12-31 DIAGNOSIS — I10 Essential (primary) hypertension: Secondary | ICD-10-CM | POA: Diagnosis not present

## 2014-12-31 DIAGNOSIS — J441 Chronic obstructive pulmonary disease with (acute) exacerbation: Secondary | ICD-10-CM | POA: Diagnosis not present

## 2014-12-31 DIAGNOSIS — M810 Age-related osteoporosis without current pathological fracture: Secondary | ICD-10-CM

## 2014-12-31 NOTE — Progress Notes (Signed)
Subjective:    [name] - 74 y.o. female MRN [id number]  Date of birth: 1940-05-16  HPI  [name] is here for medication refills.   HTN Disease Monitoring: Home BP Monitoring checks it once a week   Medications:lisinopril  Chest pain- no Dyspnea- no Compliance-  yes.  Lightheadedness-  no   Edema- no   Afib:  Controlled  Taking warfarin and dilt  Follows up with cardiology to have her INR checked.  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   Health Maintenance:  Health Maintenance Due  Topic Date Due  . COLONOSCOPY  01/02/1991  . DEXA SCAN  01/01/2006  . MAMMOGRAM  01/03/2011  . INFLUENZA VACCINE  10/08/2014    -  reports that she has been smoking Cigarettes.  She has been smoking about 1.00 pack per day. She does not have any smokeless tobacco history on file. - Review of Systems: Per HPI. - Past Medical History: Patient Active Problem List   Diagnosis Date Noted  . Encounter for therapeutic drug monitoring 05/08/2013  . Dupuytren's contracture of hand 07/22/2011  . Atrial fibrillation (HCC) 04/09/2010  . CAROTID BRUIT 11/07/2008  . Osteoporosis 11/06/2008  . TOBACCO DEPENDENCE 05/06/2006  . HYPERTENSION, BENIGN SYSTEMIC 05/06/2006  . COPD (chronic obstructive pulmonary disease) (HCC) 05/06/2006  . ARTHRITIS 05/06/2006   - Medications: reviewed and updated Current Outpatient Prescriptions  Medication Sig Dispense Refill  . albuterol (PROVENTIL HFA) 108 (90 BASE) MCG/ACT inhaler Inhale 2 puffs into the lungs every 4 (four) hours as needed for wheezing. Inhale 2 puff using inhaler every four hours 1 Inhaler 11  . albuterol (PROVENTIL) (2.5 MG/3ML) 0.083% nebulizer solution Take 3 mLs (2.5 mg total) by  nebulization every 6 (six) hours as needed for wheezing. 75 mL 12  . clotrimazole (LOTRIMIN AF) 1 % cream Apply to affected skin twice per day until rash clears, then one time daily for 1 week afterwards. 60 g 1  . diltiazem (CARDIZEM) 90 MG tablet TAKE ONE TABLET BY MOUTH 4 TIMES DAILY 120 tablet 3  . Fluticasone-Salmeterol (ADVAIR DISKUS) 500-50 MCG/DOSE AEPB Inhale 1 puff into the lungs 2 (two) times daily. 60 each 11  . ipratropium (ATROVENT HFA) 17 MCG/ACT inhaler Inhale 2 puffs into the lungs every 6 (six) hours. 1 Inhaler 5  . lisinopril (PRINIVIL,ZESTRIL) 10 MG tablet TAKE ONE TABLET BY MOUTH ONCE DAILY 30 tablet 1  . warfarin (COUMADIN) 5 MG tablet TAKE ONE TABLET BY MOUTH AS DIRECTED BY  COUMADIN  CLINIC 30 tablet 5   No current facility-administered medications for this visit.     Review of Systems See HPI     Objective:   Physical Exam BP 130/72 mmHg  Pulse 66  Temp(Src) 98 F (36.7 C) (Oral)  Ht  (1.6 m)  Wt 90 lb (40.824 kg)  BMI 15.95 kg/m2  SpO2 87% Gen: NAD, alert, cooperative with exam, elderly female  CV: sinus rhythm, regular rate, good S1/S2, no murmur, no edema, Resp: CTABL, mild exp wheezes, non-labored Skin: no rashes, normal turgor  Neuro: no gross deficits.      Assessment & Plan:   HYPERTENSION, BENIGN SYSTEMIC Controlled  - continue current meds  Atrial fibrillation (  HCC) Rate controlled.  - follows up with Cards and get INR checked  COPD (chronic obstructive pulmonary disease) (HCC) Stable and controlled - continue current meds  Osteoporosis She will decide if she wants a DEXA scan  - advised to take vit D and Calcium supplement

## 2014-12-31 NOTE — Patient Instructions (Signed)
Thank you for coming in,   Please call me if you want to do the bone scan.   Pick up calcium 400 IU and vitamin D 1000 IU to take on a daily basis.   Please bring all of your medications with you to each visit.   Sign up for My Chart to have easy access to your labs results, and communication with your Primary care physician   Please feel free to call with any questions or concerns at any time, at [phone]. --Dr. Norva Pavlov Scan A bone scan is an imaging study of your bones. It is used to identify and diagnose bone problems. You may have this test to check for:  Cancer in your bones.  A broken or cracked bone.  Bone infection.  A cause of bone pain.  Certain other bone diseases. For this test, a small amount of a radioactive substance (radiotracer) is injected into your blood. Your bones will absorb the radiotracer for a short time. The radiotracer gives off radioactive energy. This energy can be captured by a type of camera that makes images of your bones (scintigrams). Abnormal bones will take up too much or too little of the radiotracer. This will show up in the images. LET [facility] CARE PROVIDER KNOW ABOUT:  Any allergies you have, including any previous reactions you have had during an exam that used radiotracer.  All medicines you are taking, including vitamins, herbs, eye drops, creams, and over-the-counter medicines.  Any blood disorders you have.  Previous surgeries you have had.  Any medical conditions you have.  If you are pregnant or you think that you might be pregnant.  If you are breastfeeding. RISKS AND COMPLICATIONS Generally, this is a safe procedure. However, problems may occur, including:  Exposure to radiation (a small amount).  Bleeding at the injection site.  Infection at the injection site. This is rare.  Allergicreaction to the radiotracer. Severe reactions may cause a rash or breathing trouble. These reactions are rare. BEFORE THE  PROCEDURE  Ask your health care provider about changing or stopping your regular medicines. This is especially important if you are taking diabetes medicines or blood thinners.  Do not take any medicines that contain bismuth for 4 days before the scan.  Do not have X-rays that use barium contrast material for 4 days before the scan.  Do not wear metallic jewelry to the scan.  Do not drink very much for 4 hours before the scan. At the beginning of the scan, you will need to drink several glasses of water. PROCEDURE  An IV tube will be inserted into one of your veins.  You will lie down on an exam table.  The radiotracer will be injected through the IV tube. You may feel a cold sensation in your arm.  Some pictures (images) may be taken right after the injection.  Then you will need to drink 6-8 glasses of water to flush the excess tracer out of your system.  You may have to wait several hours before more images are taken.  You will get back on the exam table for more images.  The camera may move around your body.  You may be asked to stay still or to change your position. The procedure may vary among health care providers and hospitals. AFTER THE PROCEDURE  You may have to wait until a nuclear medicine specialist (radiologist) checks the images to make sure that they are readable.  More images may be taken, if  necessary.  You will be watched to make sure that you do not have a reaction to the procedure or the injected medicine.  It is your responsibility to obtain your test results. Ask your health care provider or the department performing the test when and how you will get your results.  Return to your normal activities as directed by your health care provider.   This information is not intended to replace advice given to you by your health care provider. Make sure you discuss any questions you have with your health care provider.   Document Released: 02/21/2000 Document  Revised: 07/10/2014 Document Reviewed: 12/27/2013 Elsevier Interactive Patient Education [employer]2016 Elsevier Inc.

## 2015-01-01 NOTE — Assessment & Plan Note (Signed)
Stable and controlled - continue current meds

## 2015-01-01 NOTE — Assessment & Plan Note (Signed)
Controlled; continue current meds 

## 2015-01-01 NOTE — Assessment & Plan Note (Signed)
She will decide if she wants a DEXA scan  - advised to take vit D and Calcium supplement

## 2015-01-01 NOTE — Assessment & Plan Note (Signed)
Rate controlled.  - follows up with Cards and get INR checked

## 2015-01-10 ENCOUNTER — Ambulatory Visit (INDEPENDENT_AMBULATORY_CARE_PROVIDER_SITE_OTHER): Payer: Medicare Other | Admitting: *Deleted

## 2015-01-10 DIAGNOSIS — I4891 Unspecified atrial fibrillation: Secondary | ICD-10-CM

## 2015-01-10 DIAGNOSIS — Z7901 Long term (current) use of anticoagulants: Secondary | ICD-10-CM | POA: Diagnosis not present

## 2015-01-10 DIAGNOSIS — Z5181 Encounter for therapeutic drug level monitoring: Secondary | ICD-10-CM

## 2015-01-10 DIAGNOSIS — I482 Chronic atrial fibrillation, unspecified: Secondary | ICD-10-CM

## 2015-01-10 LAB — POCT INR: INR: 1.9

## 2015-02-06 ENCOUNTER — Ambulatory Visit (INDEPENDENT_AMBULATORY_CARE_PROVIDER_SITE_OTHER): Payer: Medicare Other | Admitting: *Deleted

## 2015-02-06 DIAGNOSIS — I4891 Unspecified atrial fibrillation: Secondary | ICD-10-CM | POA: Diagnosis not present

## 2015-02-06 DIAGNOSIS — Z5181 Encounter for therapeutic drug level monitoring: Secondary | ICD-10-CM | POA: Diagnosis not present

## 2015-02-06 DIAGNOSIS — Z7901 Long term (current) use of anticoagulants: Secondary | ICD-10-CM

## 2015-02-06 DIAGNOSIS — I482 Chronic atrial fibrillation, unspecified: Secondary | ICD-10-CM

## 2015-02-06 LAB — POCT INR: INR: 3.9

## 2015-02-08 ENCOUNTER — Other Ambulatory Visit: Payer: Self-pay | Admitting: *Deleted

## 2015-02-08 DIAGNOSIS — J441 Chronic obstructive pulmonary disease with (acute) exacerbation: Secondary | ICD-10-CM

## 2015-02-08 MED ORDER — ALBUTEROL SULFATE HFA 108 (90 BASE) MCG/ACT IN AERS: 2.0000 | INHALATION_SPRAY | RESPIRATORY_TRACT | Status: DC | PRN

## 2015-02-11 ENCOUNTER — Other Ambulatory Visit: Payer: Self-pay | Admitting: Family Medicine

## 2015-02-27 ENCOUNTER — Ambulatory Visit (INDEPENDENT_AMBULATORY_CARE_PROVIDER_SITE_OTHER): Payer: Medicare Other | Admitting: *Deleted

## 2015-02-27 DIAGNOSIS — I482 Chronic atrial fibrillation, unspecified: Secondary | ICD-10-CM

## 2015-02-27 DIAGNOSIS — Z5181 Encounter for therapeutic drug level monitoring: Secondary | ICD-10-CM

## 2015-02-27 LAB — POCT INR: INR: 3.1

## 2015-03-13 ENCOUNTER — Ambulatory Visit (INDEPENDENT_AMBULATORY_CARE_PROVIDER_SITE_OTHER): Payer: Medicare Other | Admitting: Pharmacist

## 2015-03-13 DIAGNOSIS — I482 Chronic atrial fibrillation, unspecified: Secondary | ICD-10-CM

## 2015-03-13 DIAGNOSIS — Z5181 Encounter for therapeutic drug level monitoring: Secondary | ICD-10-CM

## 2015-03-13 LAB — POCT INR: INR: 2.1

## 2015-03-14 ENCOUNTER — Telehealth: Payer: Self-pay | Admitting: *Deleted

## 2015-03-14 NOTE — Telephone Encounter (Signed)
Called patient to offer flu vaccine, however, VM picked up.  Left message on patient's voice mail to return call. Nyeli Holtmeyer L, RN   

## 2015-04-03 ENCOUNTER — Ambulatory Visit (INDEPENDENT_AMBULATORY_CARE_PROVIDER_SITE_OTHER): Payer: Medicare Other | Admitting: Pharmacist

## 2015-04-03 DIAGNOSIS — I482 Chronic atrial fibrillation, unspecified: Secondary | ICD-10-CM

## 2015-04-03 DIAGNOSIS — Z5181 Encounter for therapeutic drug level monitoring: Secondary | ICD-10-CM

## 2015-04-03 LAB — POCT INR: INR: 2.3

## 2015-04-25 NOTE — Telephone Encounter (Signed)
Patient states she received flu vaccine at [facility] fall of 2016. Verified in [facility]. Added to historical record. Fredderick Severance, RN

## 2015-05-01 ENCOUNTER — Ambulatory Visit (INDEPENDENT_AMBULATORY_CARE_PROVIDER_SITE_OTHER): Payer: Medicare Other | Admitting: *Deleted

## 2015-05-01 DIAGNOSIS — Z5181 Encounter for therapeutic drug level monitoring: Secondary | ICD-10-CM | POA: Diagnosis not present

## 2015-05-01 DIAGNOSIS — I482 Chronic atrial fibrillation, unspecified: Secondary | ICD-10-CM

## 2015-05-01 LAB — POCT INR: INR: 2.2

## 2015-05-29 ENCOUNTER — Ambulatory Visit (INDEPENDENT_AMBULATORY_CARE_PROVIDER_SITE_OTHER): Payer: Medicare Other | Admitting: *Deleted

## 2015-05-29 DIAGNOSIS — Z5181 Encounter for therapeutic drug level monitoring: Secondary | ICD-10-CM

## 2015-05-29 DIAGNOSIS — I482 Chronic atrial fibrillation, unspecified: Secondary | ICD-10-CM

## 2015-05-29 LAB — POCT INR: INR: 2.1

## 2015-06-14 ENCOUNTER — Other Ambulatory Visit: Payer: Self-pay | Admitting: Family Medicine

## 2015-06-14 NOTE — Telephone Encounter (Signed)
Patient is out of her lisinopril.  She said that she called this in on Wednesday and that it still hasn't been done.

## 2015-06-17 NOTE — Telephone Encounter (Signed)
Pt is calling for a refill on her Lisinopril. She called last week for this and is now out. Myriam Jacobsonjw

## 2015-06-18 MED ORDER — LISINOPRIL 10 MG PO TABS: 10.0000 mg | ORAL_TABLET | Freq: Every day | ORAL | Status: DC

## 2015-06-18 NOTE — Telephone Encounter (Signed)
Spoke to pt. Notified her of the below information. She will make an apt. Sunday SpillersSharon T Abiageal Kirk, CMA

## 2015-06-19 ENCOUNTER — Other Ambulatory Visit: Payer: Self-pay | Admitting: Family Medicine

## 2015-06-24 ENCOUNTER — Other Ambulatory Visit: Payer: Self-pay | Admitting: *Deleted

## 2015-06-24 DIAGNOSIS — I4891 Unspecified atrial fibrillation: Secondary | ICD-10-CM

## 2015-06-25 MED ORDER — WARFARIN SODIUM 5 MG PO TABS: ORAL_TABLET | ORAL | Status: DC

## 2015-06-27 ENCOUNTER — Ambulatory Visit (INDEPENDENT_AMBULATORY_CARE_PROVIDER_SITE_OTHER): Payer: Medicare Other | Admitting: Family Medicine

## 2015-06-27 ENCOUNTER — Encounter: Payer: Self-pay | Admitting: Family Medicine

## 2015-06-27 VITALS — BP 182/89 | HR 90 | Temp 97.7°F | Wt 93.3 lb

## 2015-06-27 DIAGNOSIS — I482 Chronic atrial fibrillation, unspecified: Secondary | ICD-10-CM

## 2015-06-27 DIAGNOSIS — H6120 Impacted cerumen, unspecified ear: Secondary | ICD-10-CM | POA: Insufficient documentation

## 2015-06-27 DIAGNOSIS — H6123 Impacted cerumen, bilateral: Secondary | ICD-10-CM

## 2015-06-27 DIAGNOSIS — F17200 Nicotine dependence, unspecified, uncomplicated: Secondary | ICD-10-CM

## 2015-06-27 DIAGNOSIS — I10 Essential (primary) hypertension: Secondary | ICD-10-CM | POA: Diagnosis not present

## 2015-06-27 DIAGNOSIS — M810 Age-related osteoporosis without current pathological fracture: Secondary | ICD-10-CM | POA: Diagnosis not present

## 2015-06-27 LAB — BASIC METABOLIC PANEL WITH GFR
BUN: 14 mg/dL (ref 7–25)
CALCIUM: 9.8 mg/dL (ref 8.6–10.4)
CO2: 31 mmol/L (ref 20–31)
CREATININE: 0.71 mg/dL (ref 0.60–0.93)
Chloride: 99 mmol/L (ref 98–110)
GFR, Est African American: >89 mL/min (ref >=60)
GFR, Est Non African American: 84 mL/min (ref >=60)
GLUCOSE: 104 mg/dL — AB (ref 65–99)
Potassium: 5.1 mmol/L (ref 3.5–5.3)
SODIUM: 139 mmol/L (ref 135–146)

## 2015-06-27 LAB — CBC
HCT: 47.5 % — ABNORMAL HIGH (ref 35.0–45.0)
HEMOGLOBIN: 16 g/dL — AB (ref 11.7–15.5)
MCH: 32.7 pg (ref 27.0–33.0)
MCHC: 33.7 g/dL (ref 32.0–36.0)
MCV: 97.1 fL (ref 80.0–100.0)
MPV: 10.3 fL (ref 7.5–12.5)
Platelets: 291 10*3/uL (ref 140–400)
RBC: 4.89 MIL/uL (ref 3.80–5.10)
RDW: 13.7 % (ref 11.0–15.0)
WBC: 7.5 10*3/uL (ref 3.8–10.8)

## 2015-06-27 LAB — LIPID PANEL
Cholesterol: 219 mg/dL — ABNORMAL HIGH (ref 125–200)
HDL: 105 mg/dL (ref >=46)
LDL CALC: 89 mg/dL (ref <130)
TRIGLYCERIDES: 126 mg/dL (ref <150)
Total CHOL/HDL Ratio: 2.1 Ratio (ref <=5.0)
VLDL: 25 mg/dL (ref <30)

## 2015-06-27 MED ORDER — METOPROLOL SUCCINATE ER 25 MG PO TB24: 25.0000 mg | ORAL_TABLET | Freq: Every day | ORAL | Status: DC

## 2015-06-27 MED ORDER — ALENDRONATE SODIUM 70 MG PO TABS: 70.0000 mg | ORAL_TABLET | ORAL | Status: DC

## 2015-06-27 MED ORDER — CARBAMIDE PEROXIDE 6.5 % OT SOLN: 5.0000 [drp] | Freq: Two times a day (BID) | OTIC | Status: DC

## 2015-06-27 NOTE — Assessment & Plan Note (Signed)
She has stopped taking dilt  Has not seen cardiology in a couple of years.  - try to do an EKG on next visit.  - start metoprolol 25 mg today

## 2015-06-27 NOTE — Progress Notes (Signed)
   Subjective:    [name] - 75 y.o. female MRN [id number]  Date of birth: Jul 29, 1940  CC HTN f/up  HPI  [name] is here for htn f/u.  HTN Disease Monitoring: Home BP Monitoring checks once a week   Medications:lisinopril  Chest pain- no     Dyspnea- no Compliance-  Yes .  Lightheadedness-  none   Edema- none   Tobacco abuse:  Started when she was 75 yo  Smokes 1 PPD and cherokee  Use to roll her own but doesn't have time for that.   Afib  She has not been taking her diltiazem  Denies any fatigued or palpitations.  Has not seen her cardiologist in two years.   SH: current tobacco user  PMH: chronic afib on warfarin, HTN, COPD  Health Maintenance:  Health Maintenance Due  Topic Date Due  . COLONOSCOPY  01/02/1991  . DEXA SCAN  01/01/2006  . MAMMOGRAM  01/03/2011    Review of Systems See HPI     Objective:   Physical Exam BP 182/89 mmHg  Pulse 90  Temp(Src) 97.7 F (36.5 C) (Oral)  Wt 93 lb 4.8 oz (42.321 kg)  SpO2 95% Gen: NAD, alert, cooperative with exam, frail elderly female  HEENT: cerumen impaction on b/l side, EOMI, PERRL  CV: RRR, good S1/S2, no murmur, no edema, Resp: CTABL, no wheezes, non-labored Skin: no rashes, normal turgor  Neuro: no gross deficits.      Assessment & Plan:   HYPERTENSION, BENIGN SYSTEMIC Uncontrolled  Is only taking lisinopril  - add toprol XL for bp and rate control  - f/u in one month. May need to increase lisinopril next.   Atrial fibrillation (HCC) She has stopped taking dilt  Has not seen cardiology in a couple of years.  - try to do an EKG on next visit.  - start metoprolol 25 mg today   Osteoporosis She is unsure if she has ever taken bisphosphonates  Looks like her diagnosis was made in 2012.  - started alendronate 70 mg weekly  - vitamin D level today  - encouraged to have at least 1200-1500 IU of calcium and [phone] of vitamin D   TOBACCO DEPENDENCE No thoughts in quitting.    Cerumen impaction Tried to remove with alligator forceps and with irrigation with no relief  - try debrox for one month  - could do irrigation again on follow up.

## 2015-06-27 NOTE — Assessment & Plan Note (Signed)
Uncontrolled  Is only taking lisinopril  - add toprol XL for bp and rate control  - f/u in one month. May need to increase lisinopril next.

## 2015-06-27 NOTE — Patient Instructions (Signed)
Thank you for coming in,   I will call or send a letter with the results from today  Please bring all of your medications with you to each visit.   Sign up for My Chart to have easy access to your labs results, and communication with your Primary care physician   Please feel free to call with any questions or concerns at any time, at [phone]. --Dr. Jerrilyn Messinger  

## 2015-06-27 NOTE — Assessment & Plan Note (Signed)
No thoughts in quitting.

## 2015-06-27 NOTE — Assessment & Plan Note (Addendum)
She is unsure if she has ever taken bisphosphonates  Looks like her diagnosis was made in 2012.  - started alendronate 70 mg weekly  - vitamin D level today  - encouraged to have at least 1200-1500 IU of calcium and [phone] of vitamin D

## 2015-06-27 NOTE — Assessment & Plan Note (Signed)
Tried to remove with alligator forceps and with irrigation with no relief  - try debrox for one month  - could do irrigation again on follow up.

## 2015-06-28 LAB — VITAMIN D 25 HYDROXY (VIT D DEFICIENCY, FRACTURES): Vit D, 25-Hydroxy: 9 ng/mL — ABNORMAL LOW (ref 30–100)

## 2015-07-01 ENCOUNTER — Other Ambulatory Visit: Payer: Self-pay | Admitting: *Deleted

## 2015-07-01 DIAGNOSIS — J449 Chronic obstructive pulmonary disease, unspecified: Secondary | ICD-10-CM

## 2015-07-01 MED ORDER — ALBUTEROL SULFATE (2.5 MG/3ML) 0.083% IN NEBU: 2.5000 mg | INHALATION_SOLUTION | Freq: Four times a day (QID) | RESPIRATORY_TRACT | Status: DC | PRN

## 2015-07-09 ENCOUNTER — Telehealth: Payer: Self-pay | Admitting: Family Medicine

## 2015-07-09 MED ORDER — VITAMIN D (ERGOCALCIFEROL) 1.25 MG (50000 UNIT) PO CAPS: 50000.0000 [IU] | ORAL_CAPSULE | ORAL | Status: AC

## 2015-07-09 NOTE — Telephone Encounter (Signed)
Left VM for patient. If she calls back please have her speak with a nurse/CMA and inform that her vitamin D was low so I sent in a prescription to increase this. Her other lab work was around her baseline.   If any questions then please take the best time and phone number to call and I will try to call her back.   Myra RudeJeremy E Aiyonna Lucado, MD PGY-3, [facility] 07/09/2015, 1:38 PM

## 2015-07-10 ENCOUNTER — Ambulatory Visit (INDEPENDENT_AMBULATORY_CARE_PROVIDER_SITE_OTHER): Payer: Medicare Other | Admitting: *Deleted

## 2015-07-10 DIAGNOSIS — Z5181 Encounter for therapeutic drug level monitoring: Secondary | ICD-10-CM

## 2015-07-10 DIAGNOSIS — I482 Chronic atrial fibrillation, unspecified: Secondary | ICD-10-CM

## 2015-07-10 LAB — POCT INR: INR: 2.8

## 2015-08-15 ENCOUNTER — Other Ambulatory Visit: Payer: Self-pay | Admitting: Family Medicine

## 2015-08-20 ENCOUNTER — Ambulatory Visit (INDEPENDENT_AMBULATORY_CARE_PROVIDER_SITE_OTHER): Payer: Medicare Other | Admitting: *Deleted

## 2015-08-20 DIAGNOSIS — I482 Chronic atrial fibrillation, unspecified: Secondary | ICD-10-CM

## 2015-08-20 DIAGNOSIS — Z5181 Encounter for therapeutic drug level monitoring: Secondary | ICD-10-CM

## 2015-08-20 LAB — POCT INR: INR: 3.1

## 2015-09-19 ENCOUNTER — Other Ambulatory Visit: Payer: Self-pay | Admitting: Family Medicine

## 2015-10-01 ENCOUNTER — Ambulatory Visit (INDEPENDENT_AMBULATORY_CARE_PROVIDER_SITE_OTHER): Payer: Medicare Other | Admitting: *Deleted

## 2015-10-01 DIAGNOSIS — Z5181 Encounter for therapeutic drug level monitoring: Secondary | ICD-10-CM

## 2015-10-01 DIAGNOSIS — I482 Chronic atrial fibrillation, unspecified: Secondary | ICD-10-CM

## 2015-10-01 LAB — POCT INR: INR: 2.9

## 2015-10-31 ENCOUNTER — Other Ambulatory Visit: Payer: Self-pay | Admitting: Family Medicine

## 2015-11-12 ENCOUNTER — Ambulatory Visit (INDEPENDENT_AMBULATORY_CARE_PROVIDER_SITE_OTHER): Payer: Medicare Other | Admitting: Pharmacist

## 2015-11-12 DIAGNOSIS — Z5181 Encounter for therapeutic drug level monitoring: Secondary | ICD-10-CM | POA: Diagnosis not present

## 2015-11-12 DIAGNOSIS — I482 Chronic atrial fibrillation, unspecified: Secondary | ICD-10-CM

## 2015-11-12 LAB — POCT INR: INR: 3.5

## 2015-12-04 ENCOUNTER — Other Ambulatory Visit: Payer: Self-pay | Admitting: *Deleted

## 2015-12-04 DIAGNOSIS — J441 Chronic obstructive pulmonary disease with (acute) exacerbation: Secondary | ICD-10-CM

## 2015-12-04 MED ORDER — ALBUTEROL SULFATE HFA 108 (90 BASE) MCG/ACT IN AERS: 2.0000 | INHALATION_SPRAY | RESPIRATORY_TRACT | 11 refills | Status: DC | PRN

## 2015-12-10 ENCOUNTER — Ambulatory Visit (INDEPENDENT_AMBULATORY_CARE_PROVIDER_SITE_OTHER): Payer: Medicare Other | Admitting: Pharmacist

## 2015-12-10 DIAGNOSIS — I482 Chronic atrial fibrillation, unspecified: Secondary | ICD-10-CM

## 2015-12-10 DIAGNOSIS — Z5181 Encounter for therapeutic drug level monitoring: Secondary | ICD-10-CM | POA: Diagnosis not present

## 2015-12-10 LAB — POCT INR: INR: 2.8

## 2015-12-17 ENCOUNTER — Other Ambulatory Visit: Payer: Self-pay | Admitting: *Deleted

## 2015-12-17 DIAGNOSIS — J441 Chronic obstructive pulmonary disease with (acute) exacerbation: Secondary | ICD-10-CM

## 2015-12-17 MED ORDER — ALBUTEROL SULFATE HFA 108 (90 BASE) MCG/ACT IN AERS: 2.0000 | INHALATION_SPRAY | RESPIRATORY_TRACT | 11 refills | Status: DC | PRN

## 2015-12-31 ENCOUNTER — Other Ambulatory Visit: Payer: Self-pay | Admitting: *Deleted

## 2015-12-31 DIAGNOSIS — J441 Chronic obstructive pulmonary disease with (acute) exacerbation: Secondary | ICD-10-CM

## 2015-12-31 MED ORDER — ALBUTEROL SULFATE HFA 108 (90 BASE) MCG/ACT IN AERS: 2.0000 | INHALATION_SPRAY | RESPIRATORY_TRACT | 11 refills | Status: DC | PRN

## 2015-12-31 MED ORDER — FLUTICASONE-SALMETEROL 500-50 MCG/DOSE IN AEPB: 1.0000 | INHALATION_SPRAY | Freq: Two times a day (BID) | RESPIRATORY_TRACT | 11 refills | Status: DC

## 2015-12-31 NOTE — Telephone Encounter (Signed)
[facility] did not receive refills for Proventil and Advair disk.  Medications refill again via fax. Clovis PuMartin, Osinachi Navarrette L, RN

## 2016-01-07 ENCOUNTER — Ambulatory Visit (INDEPENDENT_AMBULATORY_CARE_PROVIDER_SITE_OTHER): Payer: Medicare Other

## 2016-01-07 DIAGNOSIS — I482 Chronic atrial fibrillation, unspecified: Secondary | ICD-10-CM

## 2016-01-07 DIAGNOSIS — Z5181 Encounter for therapeutic drug level monitoring: Secondary | ICD-10-CM

## 2016-01-07 LAB — POCT INR: INR: 7.2

## 2016-01-07 LAB — PROTIME-INR
INR: 4.91 — AB
Prothrombin Time: 46.9 seconds — ABNORMAL HIGH (ref 11.4–15.2)

## 2016-01-16 ENCOUNTER — Other Ambulatory Visit: Payer: Self-pay | Admitting: Obstetrics and Gynecology

## 2016-01-16 DIAGNOSIS — E2839 Other primary ovarian failure: Secondary | ICD-10-CM

## 2016-01-17 ENCOUNTER — Ambulatory Visit (INDEPENDENT_AMBULATORY_CARE_PROVIDER_SITE_OTHER): Payer: Medicare Other | Admitting: Pharmacist

## 2016-01-17 DIAGNOSIS — I482 Chronic atrial fibrillation, unspecified: Secondary | ICD-10-CM

## 2016-01-17 DIAGNOSIS — Z5181 Encounter for therapeutic drug level monitoring: Secondary | ICD-10-CM

## 2016-01-17 LAB — POCT INR: INR: 1.6

## 2016-01-29 ENCOUNTER — Ambulatory Visit (INDEPENDENT_AMBULATORY_CARE_PROVIDER_SITE_OTHER): Payer: Medicare Other | Admitting: Pharmacist

## 2016-01-29 DIAGNOSIS — I482 Chronic atrial fibrillation, unspecified: Secondary | ICD-10-CM

## 2016-01-29 DIAGNOSIS — Z5181 Encounter for therapeutic drug level monitoring: Secondary | ICD-10-CM

## 2016-01-29 LAB — POCT INR: INR: 4.7

## 2016-02-05 ENCOUNTER — Ambulatory Visit (INDEPENDENT_AMBULATORY_CARE_PROVIDER_SITE_OTHER): Payer: Medicare Other | Admitting: *Deleted

## 2016-02-05 DIAGNOSIS — I482 Chronic atrial fibrillation, unspecified: Secondary | ICD-10-CM

## 2016-02-05 DIAGNOSIS — Z23 Encounter for immunization: Secondary | ICD-10-CM

## 2016-02-05 DIAGNOSIS — Z5181 Encounter for therapeutic drug level monitoring: Secondary | ICD-10-CM

## 2016-02-05 LAB — POCT INR: INR: 2.4

## 2016-02-17 ENCOUNTER — Other Ambulatory Visit: Payer: Self-pay | Admitting: Internal Medicine

## 2016-02-19 ENCOUNTER — Ambulatory Visit (INDEPENDENT_AMBULATORY_CARE_PROVIDER_SITE_OTHER): Payer: Medicare Other | Admitting: *Deleted

## 2016-02-19 DIAGNOSIS — I482 Chronic atrial fibrillation, unspecified: Secondary | ICD-10-CM

## 2016-02-19 DIAGNOSIS — Z5181 Encounter for therapeutic drug level monitoring: Secondary | ICD-10-CM | POA: Diagnosis not present

## 2016-02-19 LAB — POCT INR: INR: 3

## 2016-03-11 ENCOUNTER — Other Ambulatory Visit: Payer: Self-pay | Admitting: Internal Medicine

## 2016-03-11 ENCOUNTER — Ambulatory Visit (INDEPENDENT_AMBULATORY_CARE_PROVIDER_SITE_OTHER): Payer: Medicare Other | Admitting: *Deleted

## 2016-03-11 DIAGNOSIS — Z5181 Encounter for therapeutic drug level monitoring: Secondary | ICD-10-CM

## 2016-03-11 DIAGNOSIS — I482 Chronic atrial fibrillation, unspecified: Secondary | ICD-10-CM

## 2016-03-11 LAB — POCT INR: INR: 2.5

## 2016-04-08 ENCOUNTER — Ambulatory Visit (INDEPENDENT_AMBULATORY_CARE_PROVIDER_SITE_OTHER): Payer: Medicare Other | Admitting: *Deleted

## 2016-04-08 DIAGNOSIS — Z5181 Encounter for therapeutic drug level monitoring: Secondary | ICD-10-CM | POA: Diagnosis not present

## 2016-04-08 DIAGNOSIS — I482 Chronic atrial fibrillation, unspecified: Secondary | ICD-10-CM

## 2016-04-08 LAB — POCT INR: INR: 4.2

## 2016-04-22 ENCOUNTER — Ambulatory Visit (INDEPENDENT_AMBULATORY_CARE_PROVIDER_SITE_OTHER): Payer: Medicare Other | Admitting: *Deleted

## 2016-04-22 DIAGNOSIS — Z5181 Encounter for therapeutic drug level monitoring: Secondary | ICD-10-CM

## 2016-04-22 DIAGNOSIS — I482 Chronic atrial fibrillation, unspecified: Secondary | ICD-10-CM

## 2016-04-22 DIAGNOSIS — I4891 Unspecified atrial fibrillation: Secondary | ICD-10-CM | POA: Diagnosis not present

## 2016-04-22 LAB — POCT INR: INR: 2.9

## 2016-05-13 ENCOUNTER — Ambulatory Visit (INDEPENDENT_AMBULATORY_CARE_PROVIDER_SITE_OTHER): Payer: Medicare Other | Admitting: *Deleted

## 2016-05-13 DIAGNOSIS — I482 Chronic atrial fibrillation, unspecified: Secondary | ICD-10-CM

## 2016-05-13 DIAGNOSIS — Z5181 Encounter for therapeutic drug level monitoring: Secondary | ICD-10-CM | POA: Diagnosis not present

## 2016-05-13 DIAGNOSIS — I4891 Unspecified atrial fibrillation: Secondary | ICD-10-CM

## 2016-05-13 LAB — POCT INR: INR: 1.8

## 2016-05-18 ENCOUNTER — Other Ambulatory Visit: Payer: Self-pay | Admitting: Family Medicine

## 2016-05-18 DIAGNOSIS — I4891 Unspecified atrial fibrillation: Secondary | ICD-10-CM

## 2016-06-03 ENCOUNTER — Other Ambulatory Visit: Payer: Self-pay | Admitting: Family Medicine

## 2016-07-02 ENCOUNTER — Other Ambulatory Visit: Payer: Self-pay | Admitting: Internal Medicine

## 2016-07-30 ENCOUNTER — Other Ambulatory Visit: Payer: Self-pay | Admitting: Internal Medicine

## 2016-08-19 ENCOUNTER — Encounter (HOSPITAL_COMMUNITY): Payer: Self-pay

## 2016-08-19 ENCOUNTER — Emergency Department (HOSPITAL_COMMUNITY): Payer: Medicare Other

## 2016-08-19 ENCOUNTER — Observation Stay (HOSPITAL_COMMUNITY)
Admission: EM | Admit: 2016-08-19 | Discharge: 2016-08-20 | Disposition: A | Discharge status: Skilled Nursing Facility | Payer: Medicare Other | Attending: Family Medicine | Admitting: Family Medicine

## 2016-08-19 DIAGNOSIS — Z7901 Long term (current) use of anticoagulants: Secondary | ICD-10-CM | POA: Insufficient documentation

## 2016-08-19 DIAGNOSIS — S99921A Unspecified injury of right foot, initial encounter: Secondary | ICD-10-CM | POA: Diagnosis not present

## 2016-08-19 DIAGNOSIS — K225 Diverticulum of esophagus, acquired: Secondary | ICD-10-CM | POA: Insufficient documentation

## 2016-08-19 DIAGNOSIS — S42009A Fracture of unspecified part of unspecified clavicle, initial encounter for closed fracture: Secondary | ICD-10-CM

## 2016-08-19 DIAGNOSIS — F17210 Nicotine dependence, cigarettes, uncomplicated: Secondary | ICD-10-CM | POA: Diagnosis not present

## 2016-08-19 DIAGNOSIS — S8992XA Unspecified injury of left lower leg, initial encounter: Secondary | ICD-10-CM | POA: Diagnosis not present

## 2016-08-19 DIAGNOSIS — W19XXXA Unspecified fall, initial encounter: Secondary | ICD-10-CM | POA: Diagnosis present

## 2016-08-19 DIAGNOSIS — S42002A Fracture of unspecified part of left clavicle, initial encounter for closed fracture: Secondary | ICD-10-CM | POA: Diagnosis not present

## 2016-08-19 DIAGNOSIS — L918 Other hypertrophic disorders of the skin: Secondary | ICD-10-CM | POA: Diagnosis not present

## 2016-08-19 DIAGNOSIS — R0902 Hypoxemia: Secondary | ICD-10-CM | POA: Insufficient documentation

## 2016-08-19 DIAGNOSIS — Z79899 Other long term (current) drug therapy: Secondary | ICD-10-CM | POA: Diagnosis not present

## 2016-08-19 DIAGNOSIS — I10 Essential (primary) hypertension: Secondary | ICD-10-CM | POA: Insufficient documentation

## 2016-08-19 DIAGNOSIS — M79602 Pain in left arm: Secondary | ICD-10-CM | POA: Diagnosis not present

## 2016-08-19 DIAGNOSIS — S0990XA Unspecified injury of head, initial encounter: Secondary | ICD-10-CM | POA: Diagnosis not present

## 2016-08-19 DIAGNOSIS — M25512 Pain in left shoulder: Secondary | ICD-10-CM | POA: Diagnosis not present

## 2016-08-19 DIAGNOSIS — S42032A Displaced fracture of lateral end of left clavicle, initial encounter for closed fracture: Principal | ICD-10-CM | POA: Insufficient documentation

## 2016-08-19 DIAGNOSIS — I4891 Unspecified atrial fibrillation: Secondary | ICD-10-CM | POA: Diagnosis not present

## 2016-08-19 DIAGNOSIS — S199XXA Unspecified injury of neck, initial encounter: Secondary | ICD-10-CM | POA: Diagnosis not present

## 2016-08-19 DIAGNOSIS — M720 Palmar fascial fibromatosis [Dupuytren]: Secondary | ICD-10-CM | POA: Diagnosis not present

## 2016-08-19 DIAGNOSIS — H6123 Impacted cerumen, bilateral: Secondary | ICD-10-CM | POA: Insufficient documentation

## 2016-08-19 DIAGNOSIS — W109XXA Fall (on) (from) unspecified stairs and steps, initial encounter: Secondary | ICD-10-CM | POA: Diagnosis not present

## 2016-08-19 DIAGNOSIS — Y92009 Unspecified place in unspecified non-institutional (private) residence as the place of occurrence of the external cause: Secondary | ICD-10-CM | POA: Insufficient documentation

## 2016-08-19 DIAGNOSIS — S82002A Unspecified fracture of left patella, initial encounter for closed fracture: Secondary | ICD-10-CM

## 2016-08-19 DIAGNOSIS — I4581 Long QT syndrome: Secondary | ICD-10-CM | POA: Insufficient documentation

## 2016-08-19 DIAGNOSIS — M79671 Pain in right foot: Secondary | ICD-10-CM | POA: Diagnosis not present

## 2016-08-19 DIAGNOSIS — R262 Difficulty in walking, not elsewhere classified: Secondary | ICD-10-CM | POA: Diagnosis not present

## 2016-08-19 DIAGNOSIS — J449 Chronic obstructive pulmonary disease, unspecified: Secondary | ICD-10-CM | POA: Diagnosis not present

## 2016-08-19 DIAGNOSIS — T148XXA Other injury of unspecified body region, initial encounter: Secondary | ICD-10-CM | POA: Diagnosis not present

## 2016-08-19 DIAGNOSIS — M25562 Pain in left knee: Secondary | ICD-10-CM | POA: Diagnosis not present

## 2016-08-19 HISTORY — DX: Fracture of unspecified part of unspecified clavicle, initial encounter for closed fracture: S42.009A

## 2016-08-19 LAB — CBC
HEMATOCRIT: 45.9 % (ref 36.0–46.0)
HEMOGLOBIN: 15.6 g/dL — AB (ref 12.0–15.0)
MCH: 32.8 pg (ref 26.0–34.0)
MCHC: 34 g/dL (ref 30.0–36.0)
MCV: 96.6 fL (ref 78.0–100.0)
Platelets: 297 10*3/uL (ref 150–400)
RBC: 4.75 MIL/uL (ref 3.87–5.11)
RDW: 13 % (ref 11.5–15.5)
WBC: 11.8 10*3/uL — ABNORMAL HIGH (ref 4.0–10.5)

## 2016-08-19 LAB — PROTIME-INR
INR: 1
Prothrombin Time: 13.2 seconds (ref 11.4–15.2)

## 2016-08-19 LAB — I-STAT CHEM 8, ED
BUN: 7 mg/dL (ref 6–20)
CALCIUM ION: 1.08 mmol/L — AB (ref 1.15–1.40)
CHLORIDE: 96 mmol/L — AB (ref 101–111)
CREATININE: 0.7 mg/dL (ref 0.44–1.00)
Glucose, Bld: 113 mg/dL — ABNORMAL HIGH (ref 65–99)
HCT: 49 % — ABNORMAL HIGH (ref 36.0–46.0)
Hemoglobin: 16.7 g/dL — ABNORMAL HIGH (ref 12.0–15.0)
Potassium: 4.3 mmol/L (ref 3.5–5.1)
SODIUM: 135 mmol/L (ref 135–145)
TCO2: 29 mmol/L (ref 0–100)

## 2016-08-19 MED ORDER — HYDROCODONE-ACETAMINOPHEN 5-325 MG PO TABS
1.0000 | ORAL_TABLET | Freq: Once | ORAL | Status: AC
  Administered 2016-08-19: 1 via ORAL
  Filled 2016-08-19: qty 1

## 2016-08-19 NOTE — ED Triage Notes (Signed)
Per EMS, pt from home, pt fell down 8 steps, doesn't recall details about the fall but denies LOC. Did not hit head. Pt is on coumadin. Only complains of left shoulder pain. No obvious deformities. Pt has skin tears all down the left side. Bleeding controlled. EMS VS 194/106, HR 78, RR 20, 98% on RA. Pt alert and oriented x 4.

## 2016-08-19 NOTE — ED Notes (Signed)
Patient transported to X-ray 

## 2016-08-19 NOTE — ED Notes (Signed)
Family members took patients ear rings home with them

## 2016-08-19 NOTE — Progress Notes (Signed)
Orthopedic Tech Progress Note Patient Details:  [name] [birth year] [id number]  Ortho Devices Type of Ortho Device: Sling immobilizer Ortho Device/Splint Location: lue Ortho Device/Splint Interventions: Ordered, Adjustment   Trinna PostMartinez, Algenis Ballin J 08/19/2016, 10:42 PM

## 2016-08-19 NOTE — ED Notes (Signed)
Patient is resting comfortably in room.

## 2016-08-19 NOTE — ED Notes (Signed)
[name] [phone] [name] - [phone]

## 2016-08-20 ENCOUNTER — Emergency Department (HOSPITAL_COMMUNITY): Payer: Medicare Other

## 2016-08-20 ENCOUNTER — Encounter (HOSPITAL_COMMUNITY): Payer: Self-pay | Admitting: General Practice

## 2016-08-20 DIAGNOSIS — G8911 Acute pain due to trauma: Secondary | ICD-10-CM | POA: Diagnosis not present

## 2016-08-20 DIAGNOSIS — J449 Chronic obstructive pulmonary disease, unspecified: Secondary | ICD-10-CM

## 2016-08-20 DIAGNOSIS — I4581 Long QT syndrome: Secondary | ICD-10-CM | POA: Diagnosis not present

## 2016-08-20 DIAGNOSIS — I4891 Unspecified atrial fibrillation: Secondary | ICD-10-CM | POA: Diagnosis not present

## 2016-08-20 DIAGNOSIS — I10 Essential (primary) hypertension: Secondary | ICD-10-CM | POA: Diagnosis not present

## 2016-08-20 DIAGNOSIS — S42032A Displaced fracture of lateral end of left clavicle, initial encounter for closed fracture: Secondary | ICD-10-CM

## 2016-08-20 DIAGNOSIS — K225 Diverticulum of esophagus, acquired: Secondary | ICD-10-CM | POA: Diagnosis not present

## 2016-08-20 DIAGNOSIS — S42032S Displaced fracture of lateral end of left clavicle, sequela: Secondary | ICD-10-CM | POA: Diagnosis not present

## 2016-08-20 DIAGNOSIS — R410 Disorientation, unspecified: Secondary | ICD-10-CM | POA: Diagnosis not present

## 2016-08-20 DIAGNOSIS — S4990XA Unspecified injury of shoulder and upper arm, unspecified arm, initial encounter: Secondary | ICD-10-CM | POA: Diagnosis not present

## 2016-08-20 DIAGNOSIS — S42002D Fracture of unspecified part of left clavicle, subsequent encounter for fracture with routine healing: Secondary | ICD-10-CM | POA: Diagnosis not present

## 2016-08-20 DIAGNOSIS — S42035A Nondisplaced fracture of lateral end of left clavicle, initial encounter for closed fracture: Secondary | ICD-10-CM | POA: Diagnosis not present

## 2016-08-20 DIAGNOSIS — M720 Palmar fascial fibromatosis [Dupuytren]: Secondary | ICD-10-CM | POA: Diagnosis not present

## 2016-08-20 DIAGNOSIS — M25562 Pain in left knee: Secondary | ICD-10-CM | POA: Diagnosis not present

## 2016-08-20 DIAGNOSIS — R2681 Unsteadiness on feet: Secondary | ICD-10-CM | POA: Diagnosis not present

## 2016-08-20 DIAGNOSIS — L918 Other hypertrophic disorders of the skin: Secondary | ICD-10-CM | POA: Diagnosis not present

## 2016-08-20 DIAGNOSIS — M6281 Muscle weakness (generalized): Secondary | ICD-10-CM | POA: Diagnosis not present

## 2016-08-20 DIAGNOSIS — S82002S Unspecified fracture of left patella, sequela: Secondary | ICD-10-CM | POA: Diagnosis not present

## 2016-08-20 DIAGNOSIS — Z5189 Encounter for other specified aftercare: Secondary | ICD-10-CM | POA: Diagnosis not present

## 2016-08-20 DIAGNOSIS — L02416 Cutaneous abscess of left lower limb: Secondary | ICD-10-CM | POA: Diagnosis not present

## 2016-08-20 DIAGNOSIS — R262 Difficulty in walking, not elsewhere classified: Secondary | ICD-10-CM | POA: Diagnosis not present

## 2016-08-20 DIAGNOSIS — R0902 Hypoxemia: Secondary | ICD-10-CM | POA: Diagnosis not present

## 2016-08-20 DIAGNOSIS — W19XXXA Unspecified fall, initial encounter: Secondary | ICD-10-CM

## 2016-08-20 DIAGNOSIS — Y92129 Unspecified place in nursing home as the place of occurrence of the external cause: Secondary | ICD-10-CM | POA: Diagnosis not present

## 2016-08-20 DIAGNOSIS — R488 Other symbolic dysfunctions: Secondary | ICD-10-CM | POA: Diagnosis not present

## 2016-08-20 DIAGNOSIS — Z79899 Other long term (current) drug therapy: Secondary | ICD-10-CM | POA: Diagnosis not present

## 2016-08-20 DIAGNOSIS — S82002A Unspecified fracture of left patella, initial encounter for closed fracture: Secondary | ICD-10-CM

## 2016-08-20 DIAGNOSIS — F17210 Nicotine dependence, cigarettes, uncomplicated: Secondary | ICD-10-CM | POA: Diagnosis not present

## 2016-08-20 DIAGNOSIS — L03116 Cellulitis of left lower limb: Secondary | ICD-10-CM | POA: Diagnosis not present

## 2016-08-20 DIAGNOSIS — R278 Other lack of coordination: Secondary | ICD-10-CM | POA: Diagnosis not present

## 2016-08-20 DIAGNOSIS — S82002G Unspecified fracture of left patella, subsequent encounter for closed fracture with delayed healing: Secondary | ICD-10-CM | POA: Diagnosis not present

## 2016-08-20 DIAGNOSIS — Z4789 Encounter for other orthopedic aftercare: Secondary | ICD-10-CM | POA: Diagnosis not present

## 2016-08-20 DIAGNOSIS — H6123 Impacted cerumen, bilateral: Secondary | ICD-10-CM | POA: Diagnosis not present

## 2016-08-20 DIAGNOSIS — M25512 Pain in left shoulder: Secondary | ICD-10-CM | POA: Diagnosis not present

## 2016-08-20 DIAGNOSIS — S0990XA Unspecified injury of head, initial encounter: Secondary | ICD-10-CM | POA: Diagnosis not present

## 2016-08-20 DIAGNOSIS — Z046 Encounter for general psychiatric examination, requested by authority: Secondary | ICD-10-CM | POA: Diagnosis present

## 2016-08-20 DIAGNOSIS — S199XXA Unspecified injury of neck, initial encounter: Secondary | ICD-10-CM | POA: Diagnosis not present

## 2016-08-20 DIAGNOSIS — Z7901 Long term (current) use of anticoagulants: Secondary | ICD-10-CM | POA: Diagnosis not present

## 2016-08-20 DIAGNOSIS — W19XXXD Unspecified fall, subsequent encounter: Secondary | ICD-10-CM | POA: Diagnosis not present

## 2016-08-20 LAB — BASIC METABOLIC PANEL
Anion gap: 11 (ref 5–15)
BUN: 7 mg/dL (ref 6–20)
CO2: 26 mmol/L (ref 22–32)
Calcium: 8.8 mg/dL — ABNORMAL LOW (ref 8.9–10.3)
Chloride: 96 mmol/L — ABNORMAL LOW (ref 101–111)
Creatinine, Ser: 0.76 mg/dL (ref 0.44–1.00)
GFR calc Af Amer: >60 mL/min (ref >60)
GLUCOSE: 138 mg/dL — AB (ref 65–99)
Potassium: 4.2 mmol/L (ref 3.5–5.1)
Sodium: 133 mmol/L — ABNORMAL LOW (ref 135–145)

## 2016-08-20 LAB — CBC
HEMATOCRIT: 44.6 % (ref 36.0–46.0)
Hemoglobin: 15 g/dL (ref 12.0–15.0)
MCH: 32.8 pg (ref 26.0–34.0)
MCHC: 33.6 g/dL (ref 30.0–36.0)
MCV: 97.6 fL (ref 78.0–100.0)
Platelets: 283 10*3/uL (ref 150–400)
RBC: 4.57 MIL/uL (ref 3.87–5.11)
RDW: 13.2 % (ref 11.5–15.5)
WBC: 12.1 10*3/uL — ABNORMAL HIGH (ref 4.0–10.5)

## 2016-08-20 LAB — URINALYSIS, ROUTINE W REFLEX MICROSCOPIC
BILIRUBIN URINE: NEGATIVE
Glucose, UA: NEGATIVE mg/dL
Hgb urine dipstick: NEGATIVE
KETONES UR: NEGATIVE mg/dL
Leukocytes, UA: NEGATIVE
NITRITE: NEGATIVE
PROTEIN: NEGATIVE mg/dL
Specific Gravity, Urine: 1.008 (ref 1.005–1.030)
pH: 7 (ref 5.0–8.0)

## 2016-08-20 MED ORDER — LISINOPRIL 10 MG PO TABS
10.0000 mg | ORAL_TABLET | Freq: Every day | ORAL | Status: DC
  Administered 2016-08-20: 10 mg via ORAL
  Filled 2016-08-20: qty 1

## 2016-08-20 MED ORDER — ACETAMINOPHEN 325 MG PO TABS: 650.0000 mg | ORAL_TABLET | Freq: Four times a day (QID) | ORAL | 0 refills | Status: AC | PRN

## 2016-08-20 MED ORDER — HYDROCODONE-ACETAMINOPHEN 5-325 MG PO TABS
1.0000 | ORAL_TABLET | ORAL | Status: DC | PRN
  Administered 2016-08-20: 1 via ORAL
  Filled 2016-08-20: qty 1

## 2016-08-20 MED ORDER — ALBUTEROL SULFATE (2.5 MG/3ML) 0.083% IN NEBU: 2.5000 mg | INHALATION_SOLUTION | RESPIRATORY_TRACT | Status: DC | PRN

## 2016-08-20 MED ORDER — IPRATROPIUM BROMIDE HFA 17 MCG/ACT IN AERS: 2.0000 | INHALATION_SPRAY | Freq: Four times a day (QID) | RESPIRATORY_TRACT | Status: DC | PRN

## 2016-08-20 MED ORDER — MOMETASONE FURO-FORMOTEROL FUM 200-5 MCG/ACT IN AERO
2.0000 | INHALATION_SPRAY | Freq: Two times a day (BID) | RESPIRATORY_TRACT | Status: DC
  Administered 2016-08-20: 2 via RESPIRATORY_TRACT
  Filled 2016-08-20 (×2): qty 8.8

## 2016-08-20 MED ORDER — WARFARIN - PHARMACIST DOSING INPATIENT
Freq: Every day | Status: DC
Start: 2016-08-20 — End: 2016-08-20
  Administered 2016-08-20: 18:00:00

## 2016-08-20 MED ORDER — SODIUM CHLORIDE 0.9 % IV BOLUS (SEPSIS)
500.0000 mL | Freq: Once | INTRAVENOUS | Status: AC
  Administered 2016-08-20: 500 mL via INTRAVENOUS

## 2016-08-20 MED ORDER — IPRATROPIUM BROMIDE 0.02 % IN SOLN: 0.5000 mg | Freq: Four times a day (QID) | RESPIRATORY_TRACT | Status: DC | PRN

## 2016-08-20 MED ORDER — DILTIAZEM HCL 90 MG PO TABS
90.0000 mg | ORAL_TABLET | Freq: Four times a day (QID) | ORAL | Status: DC
  Administered 2016-08-20 (×3): 90 mg via ORAL
  Filled 2016-08-20 (×5): qty 1

## 2016-08-20 MED ORDER — VITAMIN D (ERGOCALCIFEROL) 1.25 MG (50000 UNIT) PO CAPS
50000.0000 [IU] | ORAL_CAPSULE | ORAL | Status: DC
  Administered 2016-08-20: 50000 [IU] via ORAL
  Filled 2016-08-20: qty 1

## 2016-08-20 MED ORDER — METOPROLOL SUCCINATE ER 25 MG PO TB24
25.0000 mg | ORAL_TABLET | Freq: Every day | ORAL | Status: DC
  Administered 2016-08-20: 25 mg via ORAL
  Filled 2016-08-20: qty 1

## 2016-08-20 MED ORDER — ACETAMINOPHEN 325 MG PO TABS: 650.0000 mg | ORAL_TABLET | Freq: Four times a day (QID) | ORAL | Status: DC | PRN

## 2016-08-20 MED ORDER — HYDROCODONE-ACETAMINOPHEN 5-325 MG PO TABS: 1.0000 | ORAL_TABLET | Freq: Four times a day (QID) | ORAL | 0 refills | Status: AC | PRN

## 2016-08-20 MED ORDER — WARFARIN SODIUM 5 MG PO TABS
5.0000 mg | ORAL_TABLET | Freq: Once | ORAL | Status: AC
  Administered 2016-08-20: 5 mg via ORAL
  Filled 2016-08-20: qty 1

## 2016-08-20 MED ORDER — ACETAMINOPHEN 650 MG RE SUPP: 650.0000 mg | Freq: Four times a day (QID) | RECTAL | Status: DC | PRN

## 2016-08-20 NOTE — Clinical Social Work Placement (Addendum)
   CLINICAL SOCIAL WORK PLACEMENT  NOTE  Date:  08/20/2016  Patient Details  Name: [name] MRN: [id number] Date of Birth: [birth year]  Clinical Social Work is seeking post-discharge placement for this patient at the Skilled  Nursing Facility level of care (*CSW will initial, date and re-position this form in  chart as items are completed):  Yes   Patient/family provided with [facility] Clinical Social Work Department's list of facilities offering this level of care within the geographic area requested by the patient (or if unable, by the patient's family).  Yes   Patient/family informed of their freedom to choose among providers that offer the needed level of care, that participate in Medicare, Medicaid or managed care program needed by the patient, have an available bed and are willing to accept the patient.  Yes   Patient/family informed of [facility]'s ownership interest in [facility] and [facility], as well as of the fact that they are under no obligation to receive care at these facilities.  PASRR submitted to EDS on       PASRR number received on 08/20/16     Existing PASRR number confirmed on 08/20/16     FL2 transmitted to all facilities in geographic area requested by pt/family on 08/20/16     FL2 transmitted to all facilities within larger geographic area on 08/20/16     Patient informed that his/her managed care company has contracts with or will negotiate with certain facilities, including the following:        Yes   Patient/family informed of bed offers received.  [facility]     Physician recommends and patient chooses bed at      [facility].  Patient to be transferred to facility by [facility]     Patient family notified on 08/20/16 of transfer.  Name of family member notified:  [place], son     PHYSICIAN Please prepare priority discharge summary, including medications, Please  prepare prescriptions, Please sign FL2     Additional Comment:    _______________________________________________ Tresa MoorePatricia V Tambra Muller, LCSW 08/20/2016, 3:45 PM

## 2016-08-20 NOTE — Discharge Summary (Signed)
Family Medicine Teaching [facility] Discharge Summary  Patient name: [name] Medical record number: [id number] Date of birth: 11-14-1940 Age: 76 y.o. Gender: female Date of Admission: 08/19/2016  Date of Discharge: 08/20/16 Admitting Physician: Doreene Eland, MD  Primary Care Provider: Berton Bon, MD Consultants: Orthopedics  Indication for Hospitalization: L clavicular fracture 2/2 mechanical fall  Discharge Diagnoses/Problem List:  Patient Active Problem List   Diagnosis Date Noted  . Fall 08/20/2016  . Closed displaced fracture of lateral end of left clavicle   . Closed nondisplaced fracture of left patella   . Cerumen impaction 06/27/2015  . Encounter for therapeutic drug monitoring 05/08/2013  . Dupuytren's contracture of hand 07/22/2011  . Atrial fibrillation (HCC) 04/09/2010  . CAROTID BRUIT 11/07/2008  . Osteoporosis 11/06/2008  . TOBACCO DEPENDENCE 05/06/2006  . HYPERTENSION, BENIGN SYSTEMIC 05/06/2006  . COPD (chronic obstructive pulmonary disease) (HCC) 05/06/2006  . ARTHRITIS 05/06/2006    Disposition: SNF  Discharge Condition: Stable, improved   Discharge Exam: Please see progress note from day of discharge  Brief Hospital Course:  [name] is a 76 y.o. female with a past medical history significant for atrial fibrillation, COPD, HTN, osteoporosis and tobacco use who presented after a fall at home and found to have a left clavicular fracture. Fall was deemed to be likely mechanical in nature. Given patient is on warfarin, CT head was obtained, negative for acute abnormalities. C spine also was negative for trauma but did find incidental pulmonary nodules. Since xray showed a displaced fracture at the junction of the middle and lateral thirds of the left clavicle with inferior displacement of the distal clavicle, orthopedics was consulted who recommended nonoperative management with follow up as outpatient in 2 weeks. PT/OT  evaluated patient and felt that she needed to go to SNF due to impaired mobility. She was deemed stable for discharge to SNF.   Issues for Follow Up:  1. Orthopedics follow up with Dr. Duwayne Heck in about 2 weeks for L clavicular fracture. Nonoperative management with sling. 2. Found 6 mm pulmonary nodules noted at both lung apices, needs non-contrast chest CT at 3-6 months.  Significant Procedures: none  Significant Labs and Imaging:   Recent Labs Lab 08/19/16 2232 08/19/16 2239 08/20/16 0452  WBC 11.8*  --  12.1*  HGB 15.6* 16.7* 15.0  HCT 45.9 49.0* 44.6  PLT 297  --  283    Recent Labs Lab 08/19/16 2239 08/20/16 0452  NA 135 133*  K 4.3 4.2  CL 96* 96*  CO2  --  26  GLUCOSE 113* 138*  BUN 7 7  CREATININE 0.70 0.76  CALCIUM  --  8.8*   Dg Ribs Unilateral W/chest Left  Result Date: 08/19/2016 CLINICAL DATA:  Status post fall down 8 steps, with left-sided rib pain. Initial encounter. EXAM: LEFT RIBS AND CHEST - 3+ VIEW COMPARISON:  Chest radiograph performed 05/15/2010 FINDINGS: No displaced rib fractures are seen. There is a displaced fracture at the junction of the middle and lateral thirds of the left clavicle, with inferior displacement of the distal clavicle. The lungs are well-aerated and clear. There is no evidence of focal opacification, pleural effusion or pneumothorax. The cardiomediastinal silhouette is within normal limits. No acute osseous abnormalities are seen. IMPRESSION: 1. No displaced rib fracture seen. 2. Displaced fracture at the junction of the middle and lateral thirds of the left clavicle, with inferior displacement of the distal clavicle. 3. No acute cardiopulmonary process seen. Electronically Signed  By: Roanna RaiderJeffery  Chang M.D.   On: 08/19/2016 22:18   Dg Clavicle Left  Result Date: 08/19/2016 CLINICAL DATA:  Left shoulder pain EXAM: LEFT CLAVICLE - 2+ VIEWS COMPARISON:  None. FINDINGS: Mid clavicular fracture with approximately 1/2 shaft width  depression of the lateral fracture fragment The joint spaces are preserved. Visualized soft tissues are within normal limits. Visualized left lung is clear. IMPRESSION: Mid clavicular fracture, as above. Electronically Signed   By: Charline BillsSriyesh  Krishnan M.D.   On: 08/19/2016 20:04   Ct Head Wo Contrast  Result Date: 08/20/2016 CLINICAL DATA:  Status post fall down 8 steps. Patient on Coumadin. Concern for head injury. Initial encounter. EXAM: CT HEAD WITHOUT CONTRAST TECHNIQUE: Contiguous axial images were obtained from the base of the skull through the vertex without intravenous contrast. COMPARISON:  None. FINDINGS: Brain: No evidence of acute infarction, hemorrhage, hydrocephalus, extra-axial collection or mass lesion/mass effect. Prominence of the ventricles and sulci reflects mild to moderate cortical volume loss. Mild cerebellar atrophy is noted. Diffuse periventricular and subcortical white matter change likely reflects small vessel ischemic microangiopathy. A chronic lacunar infarct is noted at the left basal ganglia. The brainstem and fourth ventricle are within normal limits. The cerebral hemispheres demonstrate grossly normal gray-white differentiation. No mass effect or midline shift is seen. Vascular: No hyperdense vessel or unexpected calcification. Skull: There is no evidence of fracture; visualized osseous structures are unremarkable in appearance. Sinuses/Orbits: The orbits are within normal limits. The paranasal sinuses and mastoid air cells are well-aerated. Other: No significant soft tissue abnormalities are seen. IMPRESSION: 1. No evidence of traumatic intracranial injury or fracture. 2. Mild to moderate cortical volume loss and diffuse small vessel ischemic microangiopathy. 3. Chronic lacunar infarct at the left basal ganglia. Electronically Signed   By: Roanna RaiderJeffery  Chang M.D.   On: 08/20/2016 01:43   Ct Cervical Spine Wo Contrast  Result Date: 08/19/2016 CLINICAL DATA:  Status post fall down  the steps, with concern for neck injury. Initial encounter. EXAM: CT CERVICAL SPINE WITHOUT CONTRAST TECHNIQUE: Multidetector CT imaging of the cervical spine was performed without intravenous contrast. Multiplanar CT image reconstructions were also generated. COMPARISON:  None. FINDINGS: Alignment: Normal. Skull base and vertebrae: No acute fracture. No primary bone lesion or focal pathologic process. Soft tissues and spinal canal: No prevertebral fluid or swelling. No visible canal hematoma. Disc levels: There is narrowing of the intervertebral disc spaces at C5-C6 and C6-C7, with anterior and posterior disc osteophyte complexes, and underlying vacuum phenomenon. Facet disease is noted at the mid and lower cervical spine. Diffuse degenerative change is seen about the dens. Upper chest: 6 mm nodules are noted at both lung apices. Underlying emphysema is noted. Mild scarring is noted at the lung apices. The thyroid gland is unremarkable in appearance. Mild calcification is noted at the carotid bifurcations bilaterally. Other: The visualized portions of the brain are grossly unremarkable. Cerumen is noted filling the external auditory canals bilaterally, though there is a somewhat prominent skin tag noted more superficially at the left external auditory canal. There appears to be a small Zenker's diverticulum posterior to the superior aspect of the esophagus, containing air. IMPRESSION: 1. No evidence of fracture or subluxation along the cervical spine. 2. Mild degenerative change along the lower cervical spine. 3. **An incidental finding of potential clinical significance has been found. 6 mm pulmonary nodules noted at both lung apices. Non-contrast chest CT at 3-6 months is recommended. If the nodules are stable at time of repeat CT, then  future CT at 18-24 months (from today's scan) is considered optional for low-risk patients, but is recommended for high-risk patients. This recommendation follows the consensus  statement: Guidelines for Management of Incidental Pulmonary Nodules Detected on CT Images: From the Fleischner Society 2017; Radiology 2017; 284:228-243.** 4. Underlying emphysema and mild scarring at the lung apices. 5. Mild calcification at the carotid bifurcations bilaterally. 6. Cerumen noted filling the external auditory canals bilaterally, though there is a somewhat prominent skin tag noted more superficially at the left external auditory canal. 7. Apparent small Zenker's diverticulum noted posterior to the superior aspect of the esophagus, containing air. Electronically Signed   By: Roanna Raider M.D.   On: 08/19/2016 21:47   Dg Shoulder Left  Result Date: 08/19/2016 CLINICAL DATA:  Left shoulder pain, status post fall EXAM: LEFT SHOULDER - 2+ VIEW COMPARISON:  None. FINDINGS: Mid clavicular fracture. No fracture is seen involving the humerus. The joint spaces are preserved. Visualized soft tissues are within normal limits. Visualized left lung is clear. IMPRESSION: Mid clavicular fracture. Electronically Signed   By: Charline Bills M.D.   On: 08/19/2016 20:03   Dg Knee Complete 4 Views Left  Result Date: 08/19/2016 CLINICAL DATA:  Status post fall down eight stairs, with left knee pain. Initial encounter. EXAM: LEFT KNEE - COMPLETE 4+ VIEW COMPARISON:  None. FINDINGS: There is no definite evidence of fracture or dislocation. Mild marginal osteophyte formation is noted at the medial compartment. Mild chondrocalcinosis is noted. Slight cortical irregularity about the inferior patella is thought to be chronic in nature, though would correlate clinically for any evidence of patellar fracture. No significant joint effusion is seen. The visualized soft tissues are normal in appearance. IMPRESSION: 1. No definite evidence of acute fracture or dislocation. 2. Slight cortical irregularity about the inferior patella is thought to be chronic in nature, though would correlate clinically for any evidence of  patellar fracture. 3. Mild degenerative change at the medial compartment, with underlying chondrocalcinosis. Electronically Signed   By: Roanna Raider M.D.   On: 08/19/2016 22:17   Dg Foot Complete Right  Result Date: 08/20/2016 CLINICAL DATA:  Fall with pain EXAM: RIGHT FOOT COMPLETE - 3+ VIEW COMPARISON:  None. FINDINGS: Osteopenia. No fracture or malalignment. Prominent soft tissue calcification posterior to the ankle. IMPRESSION: No acute osseous abnormality. Electronically Signed   By: Jasmine Pang M.D.   On: 08/20/2016 00:05    Results/Tests Pending at Time of Discharge: none  Discharge Medications:  Allergies as of 08/20/2016      Reactions   Doxycycline Other (See Comments)   REACTION: GI upset      Medication List    TAKE these medications   acetaminophen 325 MG tablet Commonly known as:  TYLENOL Take 2 tablets (650 mg total) by mouth every 6 (six) hours as needed for mild pain (or Fever >/= 101).   albuterol 108 (90 Base) MCG/ACT inhaler Commonly known as:  PROVENTIL HFA Inhale 2 puffs into the lungs every 4 (four) hours as needed for wheezing. Inhale 2 puff using inhaler every four hours What changed:  Another medication with the same name was removed. Continue taking this medication, and follow the directions you see here.   alendronate 70 MG tablet Commonly known as:  FOSAMAX TAKE ONE TABLET BY MOUTH ONCE EVERY 7 DAYS. TAKE WITH A FULL GLASS OF WATER ON AN EMPTY STOMACH What changed:  See the new instructions.   diltiazem 90 MG tablet Commonly known as:  CARDIZEM TAKE  ONE TABLET BY MOUTH 4 TIMES DAILY   Fluticasone-Salmeterol 500-50 MCG/DOSE Aepb Commonly known as:  ADVAIR DISKUS Inhale 1 puff into the lungs 2 (two) times daily.   HYDROcodone-acetaminophen 5-325 MG tablet Commonly known as:  NORCO/VICODIN Take 1 tablet by mouth every 6 (six) hours as needed for moderate pain.   ipratropium 17 MCG/ACT inhaler Commonly known as:  ATROVENT HFA Inhale 2 puffs  into the lungs every 6 (six) hours. What changed:  when to take this  reasons to take this   lisinopril 10 MG tablet Commonly known as:  PRINIVIL,ZESTRIL TAKE ONE TABLET BY MOUTH ONCE DAILY What changed:  See the new instructions.   metoprolol succinate 25 MG 24 hr tablet Commonly known as:  TOPROL-XL TAKE ONE TABLET BY MOUTH ONCE DAILY What changed:  See the new instructions.   Vitamin D (Ergocalciferol) 50000 units Caps capsule Commonly known as:  DRISDOL Take 1 capsule (50,000 Units total) by mouth every 7 (seven) days.   warfarin 5 MG tablet Commonly known as:  COUMADIN TAKE ONE TABLET BY MOUTH AS DIRECTED BY  COUMADIN  CLINIC What changed:  See the new instructions.       Discharge Instructions: Please refer to Patient Instructions section of EMR for full details.  Patient was counseled important signs and symptoms that should prompt return to medical care, changes in medications, dietary instructions, activity restrictions, and follow up appointments.   Follow-Up Appointments:  Contact information for follow-up providers    Yolonda Kida, MD. Schedule an appointment as soon as possible for a visit in 2 week(s).   Specialty:  Orthopedic Surgery Contact information: [address] [place] [address] [place] [address] [phone]            Contact information for after-discharge care    Destination    [facility] .   Specialty:  Skilled Nursing Facility Contact information: [address] [place] [address] [phone]                  Leland Her, DO 08/20/2016, 3:46 PM PGY-1, [facility]

## 2016-08-20 NOTE — Progress Notes (Signed)
FPTS Interim Progress Note  S: This morning states L shoulder pain is only mildly better after taking pain medication. Otherwise no other concerns. No HA, CP, SOB. L leg also painful but pain is not uncontrolled.  O: BP (!) 159/82 (BP Location: Right Arm)   Pulse 96   Temp 97.3 F (36.3 C) (Oral)   Resp 18   Ht 5\' 3"  (1.6 m)   Wt 45.4 kg (100 lb)   SpO2 91%   BMI 17.71 kg/m   General: lying in bed, in NAD CV: RRR, no murmurs Lungs: CTAB, diffuse expiratory wheezes. Normal effort on 1L [place] Abdomen: soft, nontender, nondistended, + bowel sounds Extremities: L arm in sling, good ROM in L hand which is also warm and well perfused. No LE edema with shallow abrasions with dressing c/d/i on L shin and L knee.  A/P: L clavicular fracture 2/2 mechanical fall. Patient L arm placed in a sling for immobilization. Pain is not uncontrolled. - Orthopedics consulted, appreciate recommendations - Acetaminophen 650 mg q6 prn - Hydrocodone-acetaminophen 5-325 mg  q4 prn for breakthrough pain  COPD: Currently wheezing but had not received morning medications. 50 pack-year of smoking. PFT confirmed COPD. Emphysema noted on C-Spine. Currently requiring 1L O2 [place], not on any O2 at home. Suspect patient is chronically hypoxic. Does not appears to be in acute exacerbation. - Continue Dulera  - Consider adding Spiriva, rather than Atrovent - Albuterol prn   Please see attending's attestation on H&P from earlier this morning for the remainder of the plan.  Leland HerYoo, Clarisse Rodriges J, DO 08/20/2016, 7:30 AM PGY-1, [facility] pager [phone]

## 2016-08-20 NOTE — Evaluation (Addendum)
Physical Therapy Evaluation Patient Details Name: [name] MRN: [id number] DOB: 1940/10/03 Today's Date: 08/20/2016   History of Present Illness  Patient is a 76 y/o female who presents s/p fall down the stairs. Found to have left clavicle fx and left rib fx. PMH includes HTN, COPD, A-fib.  Clinical Impression  Patient presents with pain, generalized weakness and impaired mobility s/p above. Tolerated standing and SPT to chair with Mod A for balance/safety. Difficulty with WB through left foot. Pt left handed so having a hard time with LUE immobilized in sling. Pt cares for disabled son at home and does not have any support. Not safe to return home at this time due to above and pt not ambulating at this time. Pt's bedroom on first level. Would benefit from ST SNF to maximize independence and mobility prior to return home. Will follow acutely.    Follow Up Recommendations SNF;Supervision/Assistance - 24 hour;Supervision for mobility/OOB    Equipment Recommendations  Other (comment) (TBA)    Recommendations for Other Services OT consult     Precautions / Restrictions Precautions Precautions: Fall Required Braces or Orthoses: Sling Restrictions Weight Bearing Restrictions: No      Mobility  Bed Mobility               General bed mobility comments: Sitting EOB upon PT arrival.   Transfers Overall transfer level: Needs assistance Equipment used: Rolling walker (2 wheeled) Transfers: Sit to/from [employer] Sit to Stand: Mod assist Stand pivot transfers: Mod assist       General transfer comment: Assist to power to standing; difficulty in mid range but once up requires Min A for balance. SPT to chair with RW and Mod A for balance. Difficulty placing weight through Left foot. Cues for sequencing.  Ambulation/Gait                Stairs            Wheelchair Mobility    Modified Rankin (Stroke Patients Only)       Balance  Overall balance assessment: Needs assistance Sitting-balance support: Feet supported;No upper extremity supported Sitting balance-[name]: Good     Standing balance support: During functional activity;Single extremity supported Standing balance-[name]: Poor Standing balance comment: Reliant on UE support and Min A for balance.                              Pertinent Vitals/Pain Pain Assessment: Faces Faces Pain Scale: Hurts little more Pain Location: left shoulder Pain Descriptors / Indicators: Sore;Aching;Constant Pain Intervention(s): Monitored during session;Repositioned;Limited activity within patient's tolerance    Home Living Family/patient expects to be discharged to:: Skilled nursing facility Living Arrangements: Children (disabled son)   Type of Home: House Home Access: Level entry     Home Layout: Two level;Bed/bath upstairs Home Equipment: Walker - 2 wheels      Prior Function Level of Independence: Independent         Comments: Cooks, cleans. Cares for disabled son.     Hand Dominance        Extremity/Trunk Assessment   Upper Extremity Assessment Upper Extremity Assessment: Defer to OT evaluation (LUE immobilized in sling)    Lower Extremity Assessment Lower Extremity Assessment: Generalized weakness (Grossly ~3+/5 throughout; left foot pain.)    Cervical / Trunk Assessment Cervical / Trunk Assessment: Kyphotic  Communication   Communication: HOH  Cognition Arousal/Alertness: Awake/alert Behavior During Therapy: WFL for  tasks assessed/performed Overall Cognitive Status: No family/caregiver present to determine baseline cognitive functioning                                 General Comments: Does not know date, "2028" Does not remember what happened.      General Comments General comments (skin integrity, edema, etc.): Sp02 ranged from high 80s to 90s on RA.     Exercises     Assessment/Plan    PT  Assessment Patient needs continued PT services  PT Problem List Decreased strength;Decreased mobility;Decreased activity tolerance;Decreased range of motion;Decreased balance;Pain;Decreased cognition       PT Treatment Interventions Therapeutic activities;Gait training;Therapeutic exercise;Patient/family education;Balance training;Stair training;Functional mobility training    PT Goals (Current goals can be found in the Care Plan section)  Acute Rehab PT Goals Patient Stated Goal: be able to walk PT Goal Formulation: With patient Time For Goal Achievement: 09/03/16 Potential to Achieve Goals: Fair    Frequency Min 3X/week   Barriers to discharge Decreased caregiver support stairs to get to bedroom. cares for son    Co-evaluation               AM-PAC PT "6 Clicks" Daily Activity  Outcome Measure Difficulty turning over in bed (including adjusting bedclothes, sheets and blankets)?: None Difficulty moving from lying on back to sitting on the side of the bed? : None Difficulty sitting down on and standing up from a chair with arms (e.g., wheelchair, bedside commode, etc,.)?: None Help needed moving to and from a bed to chair (including a wheelchair)?: A Lot Help needed walking in hospital room?: A Lot Help needed climbing 3-5 steps with a railing? : Total 6 Click Score: 17    End of Session Equipment Utilized During Treatment: Gait belt Activity Tolerance: Patient tolerated treatment well;Patient limited by pain Patient left: in chair;with call bell/phone within reach;with chair alarm set Nurse Communication: Mobility status PT Visit Diagnosis: Pain;Muscle weakness (generalized) (M62.81);Unsteadiness on feet (R26.81) Pain - Right/Left: Left Pain - part of body: Shoulder    Time: [id number] PT Time Calculation (min) (ACUTE ONLY): 19 min   Charges:   PT Evaluation $PT Eval Moderate Complexity: 1 Procedure     PT G Codes:        [name], PT,  [name] [phone]    [name] 08/20/2016, 10:12 AM

## 2016-08-20 NOTE — Clinical Social Work Note (Signed)
Clinical Social Work Assessment  Patient Details  Name: [name] MRN: [id number] Date of Birth: 1940/06/19  Date of referral:  08/20/16               Reason for consult:  Facility Placement                Permission sought to share information with:  Chartered certified accountant granted to share information::  No  Name::        Agency::  Declined to Go to SNF   Relationship::     Contact Information:     Housing/Transportation Living arrangements for the past 2 months:  Single Family Home Source of Information:  Patient Patient Interpreter Needed:  None Criminal Activity/Legal Involvement Pertinent to Current Situation/Hospitalization:  No - Comment as needed Significant Relationships:  Adult Children Lives with:  Self Do you feel safe going back to the place where you live?  Yes Need for family participation in patient care:  No (Coment)  Care giving concerns:  Patient resides at home alone. Not safe to return home at this time.  Social Worker assessment / plan:  CSW met with patient at bedside to discuss clinical team's recommendation for DC to SNF. Patient indicated that she wants to go home. CSW validated and discussed the physical impairment. Patient indicated that she is familiar with facilities a family member was in a local facility.  CSW discussed recommendation and potential bed offers. Patient indicated that she did not want to go to SNF at this time. CSW will advise RNCM of same.  Patient spoke with doctor and opted to go to SNF.  CSW discussed facility placement, options.  FL2 complete. Passr obtained. Offers sent.  Employment status:  Retired Nurse, adult PT Recommendations:  [place] / Referral to community resources:  [place]  Patient/Family's Response to care:  Patient appreciative of assistance. No issues or concerns at this time.  Patient/Family's Understanding of  and Emotional Response to Diagnosis, Current Treatment, and Prognosis:  Patient has good understanding of diagnosis, current treatment and prognosis. Patient hopeful she will improve with rehabilitation.  No issues or concerns at this time.  Emotional Assessment Appearance:  Appears stated age Attitude/Demeanor/Rapport:   (Cooperative) Affect (typically observed):  Accepting, Appropriate Orientation:  Oriented to Self, Oriented to Place, Oriented to  Time, Oriented to Situation Alcohol / Substance use:  Not Applicable Psych involvement (Current and /or in the community):  No (Comment)  Discharge Needs  Concerns to be addressed:  Care Coordination Readmission within the last 30 days:    Current discharge risk:  Dependent with Mobility, Physical Impairment Barriers to Discharge:  No Barriers Identified   Abigail MCLAURIN, LCSW 08/20/2016, 3:33 PM

## 2016-08-20 NOTE — Social Work (Signed)
Clinical Social Worker facilitated patient discharge including contacting patient family and facility to confirm patient discharge plans.  Clinical information faxed to facility and family agreeable with plan.  CSW [facility]  RN to call [phone] report prior to discharge. [place] [id number] [place].  Clinical Social Worker will sign off for now as social work intervention is no longer needed. Please consult us again if new need arises.  Keene BreathPatricia Kennet Mccort, LCSW Clinical Social Worker [phone]

## 2016-08-20 NOTE — Progress Notes (Signed)
ANTICOAGULATION CONSULT NOTE - Initial Consult  Pharmacy Consult for Coumadin Indication: atrial fibrillation  Allergies  Allergen Reactions  . Doxycycline     REACTION: GI upset    Patient Measurements: Height: 5\' 3"  (160 cm) Weight: 100 lb (45.4 kg) IBW/kg (Calculated) : 52.4  Vital Signs: Temp: 99.1 F (37.3 C) (06/13 1910) Temp Source: Oral (06/13 1910) BP: 167/108 (06/14 0200) Pulse Rate: 91 (06/14 0200)  Labs:  Recent Labs  08/19/16 2232 08/19/16 2239  HGB 15.6* 16.7*  HCT 45.9 49.0*  PLT 297  --   LABPROT 13.2  --   INR 1.00  --   CREATININE  --  0.70    Estimated Creatinine Clearance: 43.5 mL/min (by C-G formula based on SCr of 0.7 mg/dL).   Medical History: Past Medical History:  Diagnosis Date  . Atrial fibrillation (HCC)   . COPD (chronic obstructive pulmonary disease) (HCC)   . Hypertension   . VAGINITIS, ATROPHIC 05/06/2006   Qualifier: Diagnosis of  By: Haydee SalterKivett, Whitney    . ZENKER'S DIVERTICULUM 10/04/2006   Qualifier: Diagnosis of  By: Georgiana ShoreMayans  MD, Vernona RiegerLaura      Medications:  Awaiting home med rec clarification  Assessment: 76 y.o. F presents s/p fall. Pt on coumadin PTA. She is unsure what dose she takes but uses 5mg  tablets and takes a mix of 2.5mg  and 5mg . She thinks she last took coumadin 6/13. INR on admission is 1. CBC ok on admission.  At last coag clinic visit 05/13/16 she was taking 2.5mg  daily and 5mg  on Sun with INR 1.8.  Goal of Therapy:  INR 2-3 Monitor platelets by anticoagulation protocol: Yes   Plan:  Daily INR Coumadin 5mg  po tonight Try to find out more about home dosing  Christoper Fabianaron Charls Custer, PharmD, BCPS Clinical pharmacist, pager [phone] 08/20/2016,3:34 AM

## 2016-08-20 NOTE — H&P (Signed)
Family Medicine Teaching [facility] Admission History and Physical Service Pager: [phone]  Patient name: [name] Medical record number: [id number] Date of birth: 1941/01/28 Age: 76 y.o. Gender: female  Primary Care Provider: Berton Bon, MD Consultants: none  Code Status: Full   Chief Complaint: Left shoulder, knee and rib pain s/p fall   Assessment and Plan: [name] is a 76 y.o. female with a past medical history significant for atrial fibrillation, COPD, HTN, osteoporosis and tobacco use who presented after a fall at home and found to have a left clavicular and rib fracture.  #Mechanical Fall, without loss of counciousness Patient reports falling at home while trying to come down the stairs. Fall appears to be mechanical in nature. patient does have a history of daily alcohol use but does not appears to be different from her baseline of one glass of wine with dinner. Patient is not taking on a lot of medications making polypharmacy or new medications as reason for fall. Patient has atrial fibrillation was rthm and rate controlled on admission EKG making arrhythmias less likely as etiology for fall. On admission BP was 190/96, less likely was hypotensive and syncopal. Given patient is on warfarin, CT head was obtained, negative for acute, mld to moderate cortical volume loss and diffuse small vessel ischemic microangiopathy. C-spine negative as well for fracture  --Will Admit to FMTS, admitting physician Dr.Eniola --Admit to Med Surg with cardiac monitoring --Monitor Vitals --Consult PT/OT --Consult SW  #Right Foot Pain: Negative signs of fracture, though patient with significant pain likely due to bruising. Patient is able to ambulate for [place] with limited capability. Per patient son lives at home, but is disabled. Therefore, due to lack of ability to ambulate, and lack of resources  - Admitting patient for OT equipment and safe discharge home  - PT/OT to  eval  #Left clavicular fracture with distal displacement  Secondary to mechanical fall at home. Patient has a history of osteoporosis. Currently wearing  Sling for clavicular fracture with displaced distal end. --Acetaminophen 650 mg q6 prn --Hydrocodone-acetaminophen 5-325 mg  q4 prn for breakthrough pain --Follow up with orthopedics outpatient --ortho consult for clavicular  fracture   #Incidental Pulmonary Nodule: Seen on C-spine imaging  - Patient will need a repeat CT chest in 3-6 months  - Make sure patient is aware and this is in the discharge summary   #COPD : 50 pack-year of smoking. PFT confirmed COPD. Emphysema noted on C-Spine  - Continue Dulera  - Consider adding Spiriva, rather than Atrovent - Albuterol prn   #Atrial Fibrillation, controlled On admission patient was rate and rhythm controlled as seen on EKG. Patient on beta blocker and diltiazem. --Will Continue metoprolol 25 mg daily --Will continue Diltiazem 90 mg q6 --Warfarin per pharmacy, 5 mg daily   #Hypertension On Admission BP was 190/96. Elevated BP likely secondary to pain given multiple fracture. Will continue home regimen. --Continue Lisinopril 10 mg daily -- Continue to follow    #Osteoporosis, chronic; in 2017 vitamin D level low at 9  Patient with long history of osteoporosis, will continue current regimen --Vitamin D supplementation  #Social Patient will need help with ambulation and transportation post discharge, she lives at home with one of her sons who himself she called disable. Patient would benefit from [facility]. --Consult SW --Follow up on PT/OT recs  FEN/GI: Regular Diet Prophylaxis: On coumadin for afib  Disposition: Pending PT/OT recommendations and clinical improvement  History of Present Illness:  BLONDINA Kirk is a 76 y.o. female with a past medical history significant for atrial fibrillation, COPD, HTN, osteoporosis and tobacco use presenting after fall at home this evening.  Patient reports that she was walking down stairs when she felt about 8 flight of stairs. Fall appears to be mechanical in nature. Patient endorses having some alcohol earlier in the evening, but usually has one glass of wine in the evening with dinner. Patient initially complain of L knee, shoulder and rib pain. She denies any new medications, any chest pain, SOB, abdominal pain, N/V or diarrhea. Per family report there is an increase in right leg spasms/ jerking that was noticed. In the ED, imaging showed left mid clavicular fracture with downward displacement of the distal end. CXR also showed left sided rib fracture. CT of the cervical spine showed mild degenerative changes along the lower cervical spine with an incidental finding of a 6 mm pulmonary nodules at both lung apices requiring follow up CT given patient extensive smoking history. Patient was going to be discharged by the ED, but was unable to ambulate. Given age, multiple fracture, coumadin therapy ED felt patient could benefit from inpatient observation while disposition is arranged.   Review Of Systems: Per HPI with the following additions:   Review of Systems  Constitutional: Negative.   HENT: Negative.   Eyes: Negative.   Respiratory: Negative.   Cardiovascular: Negative.   Gastrointestinal: Negative.   Genitourinary: Negative.   Musculoskeletal: Positive for joint pain.  Skin: Negative.   Neurological: Negative.  Negative for dizziness, tremors and headaches.  Endo/Heme/Allergies: Negative.   Psychiatric/Behavioral: Negative.   All other systems reviewed and are negative.   Patient Active Problem List   Diagnosis Date Noted  . Fall 08/20/2016  . Cerumen impaction 06/27/2015  . Encounter for therapeutic drug monitoring 05/08/2013  . Dupuytren's contracture of hand 07/22/2011  . Atrial fibrillation (HCC) 04/09/2010  . CAROTID BRUIT 11/07/2008  . Osteoporosis 11/06/2008  . TOBACCO DEPENDENCE 05/06/2006  . HYPERTENSION,  BENIGN SYSTEMIC 05/06/2006  . COPD (chronic obstructive pulmonary disease) (HCC) 05/06/2006  . ARTHRITIS 05/06/2006    Past Medical History: Past Medical History:  Diagnosis Date  . Atrial fibrillation (HCC)   . COPD (chronic obstructive pulmonary disease) (HCC)   . Hypertension   . VAGINITIS, ATROPHIC 05/06/2006   Qualifier: Diagnosis of  By: Haydee Salter    . ZENKER'S DIVERTICULUM 10/04/2006   Qualifier: Diagnosis of  By: Georgiana Shore  MD, Vernona Rieger      Past Surgical History: History reviewed. No pertinent surgical history.  Social History: Social History  Substance Use Topics  . Smoking status: Current Every Day Smoker    Packs/day: 1.00    Types: Cigarettes  . Smokeless tobacco: Never Used     Comment: would like to try to quitt after 54 years!!!!!  . Alcohol use Yes     Comment: 1 glass of wine every day   Additional social history:  Please also refer to relevant sections of EMR.  Family History: History reviewed. No pertinent family history. (If not completed, MUST add something in)  Allergies and Medications: Allergies  Allergen Reactions  . Doxycycline     REACTION: GI upset   No current facility-administered medications on file prior to encounter.    Current Outpatient Prescriptions on File Prior to Encounter  Medication Sig Dispense Refill  . albuterol (PROVENTIL HFA) 108 (90 Base) MCG/ACT inhaler Inhale 2 puffs into the lungs every 4 (four) hours as needed for wheezing.  Inhale 2 puff using inhaler every four hours 1 Inhaler 11  . albuterol (PROVENTIL) (2.5 MG/3ML) 0.083% nebulizer solution Take 3 mLs (2.5 mg total) by nebulization every 6 (six) hours as needed for wheezing. 75 mL 2  . alendronate (FOSAMAX) 70 MG tablet TAKE ONE TABLET BY MOUTH ONCE EVERY 7 DAYS. TAKE WITH A FULL GLASS OF WATER ON AN EMPTY STOMACH 4 tablet 11  . Fluticasone-Salmeterol (ADVAIR DISKUS) 500-50 MCG/DOSE AEPB Inhale 1 puff into the lungs 2 (two) times daily. 60 each 11  . ipratropium  (ATROVENT HFA) 17 MCG/ACT inhaler Inhale 2 puffs into the lungs every 6 (six) hours. (Patient taking differently: Inhale 2 puffs into the lungs every 6 (six) hours as needed for wheezing. ) 1 Inhaler 5  . lisinopril (PRINIVIL,ZESTRIL) 10 MG tablet TAKE ONE TABLET BY MOUTH ONCE DAILY 30 tablet 4  . Vitamin D, Ergocalciferol, (DRISDOL) 50000 units CAPS capsule Take 1 capsule (50,000 Units total) by mouth every 7 (seven) days. 8 capsule 0  . warfarin (COUMADIN) 5 MG tablet TAKE ONE TABLET BY MOUTH AS DIRECTED BY  COUMADIN  CLINIC 30 tablet 5  . diltiazem (CARDIZEM) 90 MG tablet TAKE ONE TABLET BY MOUTH 4 TIMES DAILY 120 tablet 3  . metoprolol succinate (TOPROL-XL) 25 MG 24 hr tablet TAKE ONE TABLET BY MOUTH ONCE DAILY 30 tablet 3    Objective: BP (!) 147/80   Pulse 94   Temp 99.1 F (37.3 C) (Oral)   Resp (!) 21   Ht 5\' 3"  (1.6 m)   Wt 100 lb (45.4 kg)   SpO2 90%   BMI 17.71 kg/m   Physical Exam: General: NAD, pleasant, able to participate in exam Cardiac: RRR, normal heart sounds, no murmurs. 2+ radial and PT pulses bilaterally Respiratory: CTAB, normal effort, No wheezes, rales or rhonchi Abdomen: soft, nondistended, left sided tenderness  no hepatic or splenomegaly, +BS Extremities: limited exam due to left arm pain in the setting of left clavicle fracture Skin: warm and dry, no rashes noted Neuro: alert and oriented x4, no focal deficits Psych: Normal affect and mood  Labs and Imaging: CBC BMET   Recent Labs Lab 08/19/16 2232 08/19/16 2239  WBC 11.8*  --   HGB 15.6* 16.7*  HCT 45.9 49.0*  PLT 297  --     Recent Labs Lab 08/19/16 2239  NA 135  K 4.3  CL 96*  BUN 7  CREATININE 0.70  GLUCOSE 113*     Dg Ribs Unilateral W/chest Left  IMPRESSION: 1. No displaced rib fracture seen. 2. Displaced fracture at the junction of the middle and lateral thirds of the left clavicle, with inferior displacement of the distal clavicle. 3. No acute cardiopulmonary process seen.  Electronically Signed   By: Roanna Raider M.D.   On: 08/19/2016 22:18   Dg Clavicle Left  IMPRESSION: Mid clavicular fracture, as above. Electronically Signed   By: Charline Bills M.D.   On: 08/19/2016 20:04   Ct Head Wo Contrast  IMPRESSION: 1. No evidence of traumatic intracranial injury or fracture. 2. Mild to moderate cortical volume loss and diffuse small vessel ischemic microangiopathy. 3. Chronic lacunar infarct at the left basal ganglia. Electronically Signed   By: Roanna Raider M.D.   On: 08/20/2016 01:43   Ct Cervical Spine Wo Contrast  IMPRESSION: 1. No evidence of fracture or subluxation along the cervical spine. 2. Mild degenerative change along the lower cervical spine. 3. **An incidental finding of potential clinical significance has been  found. 6 mm pulmonary nodules noted at both lung apices. Non-contrast chest CT at 3-6 months is recommended. If the nodules are stable at time of repeat CT, then future CT at 18-24 months (from today's scan) is considered optional for low-risk patients, but is recommended for high-risk patients. This recommendation follows the consensus statement: Guidelines for Management of Incidental Pulmonary Nodules Detected on CT Images: From the Fleischner Society 2017; Radiology 2017; 284:228-243.** 4. Underlying emphysema and mild scarring at the lung apices. 5. Mild calcification at the carotid bifurcations bilaterally. 6. Cerumen noted filling the external auditory canals bilaterally, though there is a somewhat prominent skin tag noted more superficially at the left external auditory canal. 7. Apparent small Zenker's diverticulum noted posterior to the superior aspect of the esophagus, containing air. Electronically Signed   By: Roanna RaiderJeffery  Chang M.D.   On: 08/19/2016 21:47   Dg Shoulder Left  IMPRESSION: Mid clavicular fracture. Electronically Signed   By: Charline BillsSriyesh  Krishnan M.D.   On: 08/19/2016 20:03   Dg Knee Complete 4 Views Left  IMPRESSION: 1.  No definite evidence of acute fracture or dislocation. 2. Slight cortical irregularity about the inferior patella is thought to be chronic in nature, though would correlate clinically for any evidence of patellar fracture. 3. Mild degenerative change at the medial compartment, with underlying chondrocalcinosis. Electronically Signed   By: Roanna RaiderJeffery  Chang M.D.   On: 08/19/2016 22:17   Dg Foot Complete Right  IMPRESSION: No acute osseous abnormality. Electronically Signed   By: Jasmine PangKim  Fujinaga M.D.   On: 08/20/2016 00:05    Lovena Neighboursiallo, Abdoulaye, MD 08/20/2016, 4:52 AM PGY-1, [facility] FPTS Intern pager: [phone], text pages welcome  UPPER LEVEL ADDENDUM  I have read the above note and made revisions highlighted in blue.  Noralee CharsAsiyah Jenavee Laguardia, MD, PGY-2 [facility]

## 2016-08-20 NOTE — Progress Notes (Signed)
Patient has been non-compliant with her left shoulder sling and non-compliant with her ice pack.  She needs constant reminding and re-education regarding sling and ice pack.

## 2016-08-20 NOTE — [place] FL2 (Signed)
[place] MEDICAID FL2 LEVEL OF CARE SCREENING TOOL     IDENTIFICATION  Patient Name: [name] Birthdate: 1940-06-23 Sex: female Admission Date (Current Location): 08/19/2016  [facility] [place]   Facility and Address:  [facility], [address], [place], [place] [address]      Provider Number: [id number]  Attending Physician Name and Address:  Carney Livinghambliss, Marshall L, MD  Relative Name and Phone Number:       Current Level of Care: Hospital Recommended Level of Care: Skilled Nursing Facility Prior Approval Number:    Date Approved/Denied: 08/20/16 PASRR Number: [id number]  Discharge Plan: SNF    Current Diagnoses: Patient Active Problem List   Diagnosis Date Noted  . Fall 08/20/2016  . Closed displaced fracture of lateral end of left clavicle   . Closed nondisplaced fracture of left patella   . Cerumen impaction 06/27/2015  . Encounter for therapeutic drug monitoring 05/08/2013  . Dupuytren's contracture of hand 07/22/2011  . Atrial fibrillation (HCC) 04/09/2010  . CAROTID BRUIT 11/07/2008  . Osteoporosis 11/06/2008  . TOBACCO DEPENDENCE 05/06/2006  . HYPERTENSION, BENIGN SYSTEMIC 05/06/2006  . COPD (chronic obstructive pulmonary disease) (HCC) 05/06/2006  . ARTHRITIS 05/06/2006    Orientation RESPIRATION BLADDER Height & Weight     Self, Time, Situation, Place  O2 (Nasal Cannula 1L) Continent Weight: 100 lb (45.4 kg) Height:  5\' 3"  (160 cm)  BEHAVIORAL SYMPTOMS/MOOD NEUROLOGICAL BOWEL NUTRITION STATUS      Continent Diet (See DC Summary)  AMBULATORY STATUS COMMUNICATION OF NEEDS Skin   Limited Assist Verbally Normal                       Personal Care Assistance Level of Assistance  Bathing, Feeding, Dressing Bathing Assistance: Limited assistance Feeding assistance: Independent Dressing Assistance: Limited assistance     Functional Limitations Info  Sight, Hearing, Speech Sight Info:  Adequate Hearing Info: Adequate Speech Info: Adequate    SPECIAL CARE FACTORS FREQUENCY  PT (By licensed PT), OT (By licensed OT)     PT Frequency: 5xweek OT Frequency: 5xweek            Contractures      Additional Factors Info  Code Status, Allergies Code Status Info: Full Allergies Info: doxycycline           Current Medications (08/20/2016):  This is the current hospital active medication list Current Facility-Administered Medications  Medication Dose Route Frequency Provider Last Rate Last Dose  . acetaminophen (TYLENOL) tablet 650 mg  650 mg Oral Q6H PRN Diallo, Abdoulaye, MD       Or  . acetaminophen (TYLENOL) suppository 650 mg  650 mg Rectal Q6H PRN Diallo, Abdoulaye, MD      . albuterol (PROVENTIL) (2.5 MG/3ML) 0.083% nebulizer solution 2.5 mg  2.5 mg Nebulization Q4H PRN Mikell, Antionette PolesAsiyah Zahra, MD      . diltiazem (CARDIZEM) tablet 90 mg  90 mg Oral Q6H Diallo, Abdoulaye, MD   90 mg at 08/20/16 1257  . HYDROcodone-acetaminophen (NORCO/VICODIN) 5-325 MG per tablet 1 tablet  1 tablet Oral Q4H PRN Diallo, Abdoulaye, MD   1 tablet at 08/20/16 0453  . ipratropium (ATROVENT) nebulizer solution 0.5 mg  0.5 mg Nebulization Q6H PRN Mikell, Antionette PolesAsiyah Zahra, MD      . lisinopril (PRINIVIL,ZESTRIL) tablet 10 mg  10 mg Oral Daily Diallo, Abdoulaye, MD   10 mg at 08/20/16 0945  . metoprolol succinate (TOPROL-XL) 24 hr tablet  25 mg  25 mg Oral Daily Diallo, Abdoulaye, MD   25 mg at 08/20/16 0945  . mometasone-formoterol (DULERA) 200-5 MCG/ACT inhaler 2 puff  2 puff Inhalation BID Berton Bon, MD   2 puff at 08/20/16 1257  . Vitamin D (Ergocalciferol) (DRISDOL) capsule 50,000 Units  50,000 Units Oral Q7 days Lovena Neighbours, MD   50,000 Units at 08/20/16 0945  . warfarin (COUMADIN) tablet 5 mg  5 mg Oral ONCE-1800 Titus Mould, RPH      . Warfarin - Pharmacist Dosing Inpatient   Does not apply q1800 Titus Mould, [facility]         Discharge Medications: Please see  discharge summary for a list of discharge medications.  Relevant Imaging Results:  Relevant Lab Results:   Additional Information SS:[id number]   Tresa Moore, LCSW

## 2016-08-20 NOTE — ED Provider Notes (Signed)
[facility]-EMERGENCY DEPT Provider Note   CSN: [id number] Arrival date & time: 08/19/16  1906     History   Chief Complaint Chief Complaint  Patient presents with  . Fall  . Shoulder Pain    HPI [name] is a 76 y.o. female.  HPI Pt comes in with cc of COPD, AF comes in with cc of fall. PT is on coumadin. Pt had alcohol earlier in the night, and she was walking down stairs and fell down. Pt fell down about 8 stairs. She is complaining of pain to the L knee, L shoulder, L rib pain. Pt denies any new meds. Pt has no new numbness, tingling. Family reports however, that pt is having R sided leg spasms/ jerking motion that appears more pronounced than usual. Pt resides at home with her husband.   Past Medical History:  Diagnosis Date  . Atrial fibrillation (HCC)   . COPD (chronic obstructive pulmonary disease) (HCC)   . Hypertension   . VAGINITIS, ATROPHIC 05/06/2006   Qualifier: Diagnosis of  By: Haydee Salter    . 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 Cerumen impaction 06/27/2015  . Encounter for therapeutic drug monitoring 05/08/2013  . Dupuytren's contracture of hand 07/22/2011  . Atrial fibrillation (HCC) 04/09/2010  . CAROTID BRUIT 11/07/2008  . Osteoporosis 11/06/2008  . TOBACCO DEPENDENCE 05/06/2006  . HYPERTENSION, BENIGN SYSTEMIC 05/06/2006  . COPD (chronic obstructive pulmonary disease) (HCC) 05/06/2006  . ARTHRITIS 05/06/2006    History reviewed. No pertinent surgical history.  OB History    No data available       Home Medications    Prior to Admission medications   Medication Sig Start Date End Date Taking? Authorizing Provider  albuterol (PROVENTIL HFA) 108 (90 Base) MCG/ACT inhaler Inhale 2 puffs into the lungs every 4 (four) hours as needed for wheezing. Inhale 2 puff using inhaler every four hours 12/31/15  Yes Mikell, Antionette Poles, MD    albuterol (PROVENTIL) (2.5 MG/3ML) 0.083% nebulizer solution Take 3 mLs (2.5 mg total) by nebulization every 6 (six) hours as needed for wheezing. 07/01/15 08/19/16 Yes Jamal Collin, MD  alendronate (FOSAMAX) 70 MG tablet TAKE ONE TABLET BY MOUTH ONCE EVERY 7 DAYS. TAKE WITH A FULL GLASS OF WATER ON AN EMPTY STOMACH 06/04/16  Yes Mikell, Antionette Poles, MD  Fluticasone-Salmeterol (ADVAIR DISKUS) 500-50 MCG/DOSE AEPB Inhale 1 puff into the lungs 2 (two) times daily. 12/31/15  Yes Mikell, Antionette Poles, MD  ipratropium (ATROVENT HFA) 17 MCG/ACT inhaler Inhale 2 puffs into the lungs every 6 (six) hours. Patient taking differently: Inhale 2 puffs into the lungs every 6 (six) hours as needed for wheezing.  05/02/14 08/19/16 Yes Myra Rude, MD  lisinopril (PRINIVIL,ZESTRIL) 10 MG tablet TAKE ONE TABLET BY MOUTH ONCE DAILY 07/31/16  Yes Mikell, Antionette Poles, MD  Vitamin D, Ergocalciferol, (DRISDOL) 50000 units CAPS capsule Take 1 capsule (50,000 Units total) by mouth every 7 (seven) days. 07/09/15  Yes Myra Rude, MD  warfarin (COUMADIN) 5 MG tablet TAKE ONE TABLET BY MOUTH AS DIRECTED BY  COUMADIN  CLINIC 05/18/16  Yes Mikell, Antionette Poles, MD  diltiazem (CARDIZEM) 90 MG tablet TAKE ONE TABLET BY MOUTH 4 TIMES DAILY 12/17/14   Myra Rude, MD  metoprolol succinate (TOPROL-XL) 25 MG 24 hr tablet TAKE ONE TABLET BY MOUTH ONCE DAILY 07/02/16  Berton BonMikell, Asiyah Zahra, MD    Family History History reviewed. No pertinent family history.  Social History Social History  Substance Use Topics  . Smoking status: Current Every Day Smoker    Packs/day: 1.00    Types: Cigarettes  . Smokeless tobacco: Never Used     Comment: would like to try to quitt after 54 years!!!!!  . Alcohol use Yes     Comment: 1 glass of wine every day     Allergies   Doxycycline   Review of Systems Review of Systems  Constitutional: Positive for activity change.  Musculoskeletal: Positive for arthralgias.  Skin:  Positive for wound.  Hematological: Bruises/bleeds easily.     Physical Exam Updated Vital Signs BP (!) 192/112   Pulse 92   Temp 99.1 F (37.3 C) (Oral)   Resp (!) 21   Ht 5\' 3"  (1.6 m)   Wt 45.4 kg (100 lb)   SpO2 91%   BMI 17.71 kg/m   Physical Exam  Constitutional: She appears well-developed.  HENT:  Head: Normocephalic and atraumatic.  Eyes: EOM are normal.  Neck: No JVD present.  No midline c-spine tenderness, pt able to turn head to 45 degrees bilaterally without any pain and able to flex neck to the chest and extend without any pain or neurologic symptoms.   Cardiovascular: Normal rate.   irregular  Pulmonary/Chest: Effort normal. No respiratory distress. She exhibits tenderness.  Abdominal: Soft. Bowel sounds are normal.  No bruising  Musculoskeletal:  L shoulder and clavicular tenderness. L knee abrasion No deformity. TTP over the L knee Pelvis is stable No TTP over the thoracic or lumbar spine  Neurological: She is alert.  Skin: Skin is warm and dry.  Generalized ecchymoses  Nursing note and vitals reviewed.    ED Treatments / Results  Labs (all labs ordered are listed, but only abnormal results are displayed) Labs Reviewed  CBC - Abnormal; Notable for the following:       Result Value   WBC 11.8 (*)    Hemoglobin 15.6 (*)    All other components within normal limits  I-STAT CHEM 8, ED - Abnormal; Notable for the following:    Chloride 96 (*)    Glucose, Bld 113 (*)    Calcium, Ion 1.08 (*)    Hemoglobin 16.7 (*)    HCT 49.0 (*)    All other components within normal limits  PROTIME-INR  URINALYSIS, ROUTINE W REFLEX MICROSCOPIC    EKG  EKG Interpretation  Date/Time:  Wednesday August 19 2016 19:09:04 EDT Ventricular Rate:  78 PR Interval:    QRS Duration: 80 QT Interval:  453 QTC Calculation: 516 R Axis:   86 Text Interpretation:  Sinus rhythm Borderline right axis deviation Probable anteroseptal infarct, old Nonspecific T  abnormalities, lateral leads Prolonged QT interval No acute changes Confirmed by Derwood Kaplananavati, Juletta Berhe [phone]([id number]) on 08/19/2016 8:00:51 PM       Radiology Dg Ribs Unilateral W/chest Left  Result Date: 08/19/2016 CLINICAL DATA:  Status post fall down 8 steps, with left-sided rib pain. Initial encounter. EXAM: LEFT RIBS AND CHEST - 3+ VIEW COMPARISON:  Chest radiograph performed 05/15/2010 FINDINGS: No displaced rib fractures are seen. There is a displaced fracture at the junction of the middle and lateral thirds of the left clavicle, with inferior displacement of the distal clavicle. The lungs are well-aerated and clear. There is no evidence of focal opacification, pleural effusion or pneumothorax. The cardiomediastinal silhouette is within normal limits. No acute osseous  abnormalities are seen. IMPRESSION: 1. No displaced rib fracture seen. 2. Displaced fracture at the junction of the middle and lateral thirds of the left clavicle, with inferior displacement of the distal clavicle. 3. No acute cardiopulmonary process seen. Electronically Signed   By: Roanna Raider M.D.   On: 08/19/2016 22:18   Dg Clavicle Left  Result Date: 08/19/2016 CLINICAL DATA:  Left shoulder pain EXAM: LEFT CLAVICLE - 2+ VIEWS COMPARISON:  None. FINDINGS: Mid clavicular fracture with approximately 1/2 shaft width depression of the lateral fracture fragment The joint spaces are preserved. Visualized soft tissues are within normal limits. Visualized left lung is clear. IMPRESSION: Mid clavicular fracture, as above. Electronically Signed   By: Charline Bills M.D.   On: 08/19/2016 20:04   Ct Cervical Spine Wo Contrast  Result Date: 08/19/2016 CLINICAL DATA:  Status post fall down the steps, with concern for neck injury. Initial encounter. EXAM: CT CERVICAL SPINE WITHOUT CONTRAST TECHNIQUE: Multidetector CT imaging of the cervical spine was performed without intravenous contrast. Multiplanar CT image reconstructions were also generated.  COMPARISON:  None. FINDINGS: Alignment: Normal. Skull base and vertebrae: No acute fracture. No primary bone lesion or focal pathologic process. Soft tissues and spinal canal: No prevertebral fluid or swelling. No visible canal hematoma. Disc levels: There is narrowing of the intervertebral disc spaces at C5-C6 and C6-C7, with anterior and posterior disc osteophyte complexes, and underlying vacuum phenomenon. Facet disease is noted at the mid and lower cervical spine. Diffuse degenerative change is seen about the dens. Upper chest: 6 mm nodules are noted at both lung apices. Underlying emphysema is noted. Mild scarring is noted at the lung apices. The thyroid gland is unremarkable in appearance. Mild calcification is noted at the carotid bifurcations bilaterally. Other: The visualized portions of the brain are grossly unremarkable. Cerumen is noted filling the external auditory canals bilaterally, though there is a somewhat prominent skin tag noted more superficially at the left external auditory canal. There appears to be a small Zenker's diverticulum posterior to the superior aspect of the esophagus, containing air. IMPRESSION: 1. No evidence of fracture or subluxation along the cervical spine. 2. Mild degenerative change along the lower cervical spine. 3. **An incidental finding of potential clinical significance has been found. 6 mm pulmonary nodules noted at both lung apices. Non-contrast chest CT at 3-6 months is recommended. If the nodules are stable at time of repeat CT, then future CT at 18-24 months (from today's scan) is considered optional for low-risk patients, but is recommended for high-risk patients. This recommendation follows the consensus statement: Guidelines for Management of Incidental Pulmonary Nodules Detected on CT Images: From the Fleischner Society 2017; Radiology 2017; 284:228-243.** 4. Underlying emphysema and mild scarring at the lung apices. 5. Mild calcification at the carotid  bifurcations bilaterally. 6. Cerumen noted filling the external auditory canals bilaterally, though there is a somewhat prominent skin tag noted more superficially at the left external auditory canal. 7. Apparent small Zenker's diverticulum noted posterior to the superior aspect of the esophagus, containing air. Electronically Signed   By: Roanna Raider M.D.   On: 08/19/2016 21:47   Dg Shoulder Left  Result Date: 08/19/2016 CLINICAL DATA:  Left shoulder pain, status post fall EXAM: LEFT SHOULDER - 2+ VIEW COMPARISON:  None. FINDINGS: Mid clavicular fracture. No fracture is seen involving the humerus. The joint spaces are preserved. Visualized soft tissues are within normal limits. Visualized left lung is clear. IMPRESSION: Mid clavicular fracture. Electronically Signed   By: Lurlean Horns  Rito Ehrlich M.D.   On: 08/19/2016 20:03   Dg Knee Complete 4 Views Left  Result Date: 08/19/2016 CLINICAL DATA:  Status post fall down eight stairs, with left knee pain. Initial encounter. EXAM: LEFT KNEE - COMPLETE 4+ VIEW COMPARISON:  None. FINDINGS: There is no definite evidence of fracture or dislocation. Mild marginal osteophyte formation is noted at the medial compartment. Mild chondrocalcinosis is noted. Slight cortical irregularity about the inferior patella is thought to be chronic in nature, though would correlate clinically for any evidence of patellar fracture. No significant joint effusion is seen. The visualized soft tissues are normal in appearance. IMPRESSION: 1. No definite evidence of acute fracture or dislocation. 2. Slight cortical irregularity about the inferior patella is thought to be chronic in nature, though would correlate clinically for any evidence of patellar fracture. 3. Mild degenerative change at the medial compartment, with underlying chondrocalcinosis. Electronically Signed   By: Roanna Raider M.D.   On: 08/19/2016 22:17   Dg Foot Complete Right  Result Date: 08/20/2016 CLINICAL DATA:  Fall  with pain EXAM: RIGHT FOOT COMPLETE - 3+ VIEW COMPARISON:  None. FINDINGS: Osteopenia. No fracture or malalignment. Prominent soft tissue calcification posterior to the ankle. IMPRESSION: No acute osseous abnormality. Electronically Signed   By: Jasmine Pang M.D.   On: 08/20/2016 00:05    Procedures Procedures (including critical care time)  Medications Ordered in ED Medications  sodium chloride 0.9 % bolus 500 mL (not administered)  HYDROcodone-acetaminophen (NORCO/VICODIN) 5-325 MG per tablet 1 tablet (1 tablet Oral Given 08/19/16 2309)     Initial Impression / Assessment and Plan / ED Course  I have reviewed the triage vital signs and the nursing notes.  Pertinent labs & imaging results that were available during my care of the patient were reviewed by me and considered in my medical decision making (see chart for details).    Pt comes in with cc of fall. Pt appears to have had a mechanical fall. She is on coumadin. L shoulder / clavicle fx is suspected. Abd and chest exam are normal - L rib fracture also possible.  1:16 AM Pt is unable to ambulate. Foot Xrays ordered, as she is unable to bear weight on the R foot specifically, and there is new swelling over there - and the xrays are neg. PT still having trouble walking. I also realized that CT head was missed entirely when CT cspine was ordered -so CT head added now. PT cant ambulate, she has a clavicle fracture, she is on coumadin - it is not safe for her to go home - as I think she will end up fall again and ending up with worse injuries. We will admit the patient after CT head is done, even if it is neg with obs status and get SW, PT/Ot etc. PT and family made aware and they are OK with the plan.  Final Clinical Impressions(s) / ED Diagnoses   Final diagnoses:  Fall, initial encounter  Closed displaced fracture of acromial end of left clavicle, initial encounter  Inability to walk    New Prescriptions New Prescriptions     No medications on file     Derwood Kaplan, MD 08/20/16 [phone]

## 2016-08-20 NOTE — Progress Notes (Signed)
PT eval addendum- added G-codes    08/20/16 1016  PT G-Codes **NOT FOR INPATIENT CLASS**  Functional Assessment Tool Used Clinical judgement  Functional Limitation Mobility: Walking and moving around  Mobility: Walking and Moving Around Current Status [phone]([id number]) CK  Mobility: Walking and Moving Around Goal Status [phone](G8979) CJ    Mylo RedShauna Shayaan Parke, PT, DPT [phone]

## 2016-08-20 NOTE — Progress Notes (Signed)
Patient to be to [facility]. IV removed. Nurse called and give report to Iuvica Civjaonice, PTAR to transport patient

## 2016-08-20 NOTE — Discharge Instructions (Signed)
Clavicle Fracture A clavicle fracture is a broken collarbone. The collarbone is the long bone that connects your shoulder to your chest wall. A broken collarbone may be treated with a sling or with surgery. Treatment depends on whether the broken ends of the bone are out of place or not. Follow these instructions at home: If you have a sling:   Wear the sling as told by your doctor. Take it off only as told by your doctor.  Loosen the sling if your fingers tingle, become numb, or turn cold and blue.  Do not lift your arm. Keep it across your chest.  Keep the sling clean.  Ask your doctor if you may take off the sling for bathing.  If your sling is not waterproof, do not let it get wet. Cover the sling with a watertight covering if you take a bath or a shower while wearing it.  If you may take off your sling when you take a bath or a shower, keep your shoulder in the same position as when the sling is on. Managing pain, stiffness, and swelling   If told, put ice on the injured area:  If you have a removable sling, take it off as told by your doctor.  Put ice in a plastic bag.  Place a towel between your skin and the bag.  Leave the ice on for 20 minutes, 2-3 times a day. Activity   Avoid activities that make your symptoms worse for 4-6 weeks, or as long as told.  Ask your doctor when it is safe for you to drive.  Do exercises as told by your doctor. General instructions   Do not use any products that contain nicotine or tobacco, such as cigarettes and e-cigarettes. These can delay bone healing. If you need help quitting, ask your doctor.  Take over-the-counter and prescription medicines only as told by your doctor.  Keep all follow-up visits as told by your doctor. This is important. Contact a doctor if:  Your medicine is not making you feel less pain.  Your medicine is not making swelling better. Get help right away if:  Your cannot feel your arm (your arm is  numb).  Your arm is cold.  Your arm is a lighter color than normal. Summary  A clavicle fracture is a broken collarbone. The collarbone is the long bone that connects your shoulder to your chest wall.  Treatment depends on whether the broken ends of the bone are out of place or not.  If you have a sling, wear it as told by your doctor.  Do exercises when your doctor says you can. The exercises will help your arm get strong and move like it used to. This information is not intended to replace advice given to you by your health care provider. Make sure you discuss any questions you have with your health care provider. Document Released: 08/12/2007 Document Revised: 01/13/2016 Document Reviewed: 01/13/2016 [employer]vier Interactive Patient Education  2017 [employer]  

## 2016-08-20 NOTE — Consult Note (Signed)
Reason for Consult:Fall Referring Physician: Christianne Borrow  [name] is an 76 y.o. female.  HPI: Valor was home when she fell down about 7-8 steps. She is unsure what caused her to fall but denies syncope or dizziness. She did not lose consciousness. Her son was home and called 911. She was brought to [facility] where x-rays showed a left clavicle fx. She was also c/o left knee pain and right foot pain. X-rays were negative and in any event the pain is much better today.   Past Medical History:  Diagnosis Date  . Atrial fibrillation ([place])   . COPD (chronic obstructive pulmonary disease) ([place])   . Hypertension   . VAGINITIS, ATROPHIC 05/06/2006   Qualifier: Diagnosis of  By: Drucie Ip    . ZENKER'S DIVERTICULUM 10/04/2006   Qualifier: Diagnosis of  By: Nadara Eaton  MD, Mickel Baas      History reviewed. No pertinent surgical history.  History reviewed. No pertinent family history.  Social History:  reports that she has been smoking Cigarettes.  She has been smoking about 1.00 pack per day. She has never used smokeless tobacco. She reports that she drinks alcohol. She reports that she does not use drugs.  Allergies:  Allergies  Allergen Reactions  . Doxycycline Other (See Comments)    REACTION: GI upset    Medications: I have reviewed the patient's current medications.  Results for orders placed or performed during the hospital encounter of 08/19/16 (from the past 48 hour(s))  Protime-INR     Status: None   Collection Time: 08/19/16 10:32 PM  Result Value Ref Range   Prothrombin Time 13.2 11.4 - 15.2 seconds   INR 1.00   CBC     Status: Abnormal   Collection Time: 08/19/16 10:32 PM  Result Value Ref Range   WBC 11.8 (H) 4.0 - 10.5 K/uL   RBC 4.75 3.87 - 5.11 MIL/uL   Hemoglobin 15.6 (H) 12.0 - 15.0 g/dL   HCT 45.9 36.0 - 46.0 %   MCV 96.6 78.0 - 100.0 fL   MCH 32.8 26.0 - 34.0 pg   MCHC 34.0 30.0 - 36.0 g/dL   RDW 13.0 11.5 - 15.5 %   Platelets 297 150 - 400 K/uL   I-stat chem 8, ed     Status: Abnormal   Collection Time: 08/19/16 10:39 PM  Result Value Ref Range   Sodium 135 135 - 145 mmol/L   Potassium 4.3 3.5 - 5.1 mmol/L   Chloride 96 (L) 101 - 111 mmol/L   BUN 7 6 - 20 mg/dL   Creatinine, Ser 0.70 0.44 - 1.00 mg/dL   Glucose, Bld 113 (H) 65 - 99 mg/dL   Calcium, Ion 1.08 (L) 1.15 - 1.40 mmol/L   TCO2 29 0 - 100 mmol/L   Hemoglobin 16.7 (H) 12.0 - 15.0 g/dL   HCT 49.0 (H) 36.0 - 46.0 %  Urinalysis, Routine w reflex microscopic     Status: None   Collection Time: 08/20/16  1:39 AM  Result Value Ref Range   Color, Urine YELLOW YELLOW   APPearance CLEAR CLEAR   Specific Gravity, Urine 1.008 1.005 - 1.030   pH 7.0 5.0 - 8.0   Glucose, UA NEGATIVE NEGATIVE mg/dL   Hgb urine dipstick NEGATIVE NEGATIVE   Bilirubin Urine NEGATIVE NEGATIVE   Ketones, ur NEGATIVE NEGATIVE mg/dL   Protein, ur NEGATIVE NEGATIVE mg/dL   Nitrite NEGATIVE NEGATIVE   Leukocytes, UA NEGATIVE NEGATIVE  Basic metabolic panel  Status: Abnormal   Collection Time: 08/20/16  4:52 AM  Result Value Ref Range   Sodium 133 (L) 135 - 145 mmol/L   Potassium 4.2 3.5 - 5.1 mmol/L   Chloride 96 (L) 101 - 111 mmol/L   CO2 26 22 - 32 mmol/L   Glucose, Bld 138 (H) 65 - 99 mg/dL   BUN 7 6 - 20 mg/dL   Creatinine, Ser 0.76 0.44 - 1.00 mg/dL   Calcium 8.8 (L) 8.9 - 10.3 mg/dL   GFR calc non Af Amer >60 >60 mL/min   GFR calc Af Amer >60 >60 mL/min    Comment: (NOTE) The eGFR has been calculated using the CKD EPI equation. This calculation has not been validated in all clinical situations. eGFR's persistently <60 mL/min signify possible Chronic Kidney Disease.    Anion gap 11 5 - 15  CBC     Status: Abnormal   Collection Time: 08/20/16  4:52 AM  Result Value Ref Range   WBC 12.1 (H) 4.0 - 10.5 K/uL   RBC 4.57 3.87 - 5.11 MIL/uL   Hemoglobin 15.0 12.0 - 15.0 g/dL   HCT 44.6 36.0 - 46.0 %   MCV 97.6 78.0 - 100.0 fL   MCH 32.8 26.0 - 34.0 pg   MCHC 33.6 30.0 - 36.0 g/dL    RDW 13.2 11.5 - 15.5 %   Platelets 283 150 - 400 K/uL    Dg Ribs Unilateral W/chest Left  Result Date: 08/19/2016 CLINICAL DATA:  Status post fall down 8 steps, with left-sided rib pain. Initial encounter. EXAM: LEFT RIBS AND CHEST - 3+ VIEW COMPARISON:  Chest radiograph performed 05/15/2010 FINDINGS: No displaced rib fractures are seen. There is a displaced fracture at the junction of the middle and lateral thirds of the left clavicle, with inferior displacement of the distal clavicle. The lungs are well-aerated and clear. There is no evidence of focal opacification, pleural effusion or pneumothorax. The cardiomediastinal silhouette is within normal limits. No acute osseous abnormalities are seen. IMPRESSION: 1. No displaced rib fracture seen. 2. Displaced fracture at the junction of the middle and lateral thirds of the left clavicle, with inferior displacement of the distal clavicle. 3. No acute cardiopulmonary process seen. Electronically Signed   By: Garald Balding M.D.   On: 08/19/2016 22:18   Dg Clavicle Left  Result Date: 08/19/2016 CLINICAL DATA:  Left shoulder pain EXAM: LEFT CLAVICLE - 2+ VIEWS COMPARISON:  None. FINDINGS: Mid clavicular fracture with approximately 1/2 shaft width depression of the lateral fracture fragment The joint spaces are preserved. Visualized soft tissues are within normal limits. Visualized left lung is clear. IMPRESSION: Mid clavicular fracture, as above. Electronically Signed   By: Julian Hy M.D.   On: 08/19/2016 20:04   Ct Head Wo Contrast  Result Date: 08/20/2016 CLINICAL DATA:  Status post fall down 8 steps. Patient on Coumadin. Concern for head injury. Initial encounter. EXAM: CT HEAD WITHOUT CONTRAST TECHNIQUE: Contiguous axial images were obtained from the base of the skull through the vertex without intravenous contrast. COMPARISON:  None. FINDINGS: Brain: No evidence of acute infarction, hemorrhage, hydrocephalus, extra-axial collection or mass  lesion/mass effect. Prominence of the ventricles and sulci reflects mild to moderate cortical volume loss. Mild cerebellar atrophy is noted. Diffuse periventricular and subcortical white matter change likely reflects small vessel ischemic microangiopathy. A chronic lacunar infarct is noted at the left basal ganglia. The brainstem and fourth ventricle are within normal limits. The cerebral hemispheres demonstrate grossly normal gray-white differentiation.  No mass effect or midline shift is seen. Vascular: No hyperdense vessel or unexpected calcification. Skull: There is no evidence of fracture; visualized osseous structures are unremarkable in appearance. Sinuses/Orbits: The orbits are within normal limits. The paranasal sinuses and mastoid air cells are well-aerated. Other: No significant soft tissue abnormalities are seen. IMPRESSION: 1. No evidence of traumatic intracranial injury or fracture. 2. Mild to moderate cortical volume loss and diffuse small vessel ischemic microangiopathy. 3. Chronic lacunar infarct at the left basal ganglia. Electronically Signed   By: Garald Balding M.D.   On: 08/20/2016 01:43   Ct Cervical Spine Wo Contrast  Result Date: 08/19/2016 CLINICAL DATA:  Status post fall down the steps, with concern for neck injury. Initial encounter. EXAM: CT CERVICAL SPINE WITHOUT CONTRAST TECHNIQUE: Multidetector CT imaging of the cervical spine was performed without intravenous contrast. Multiplanar CT image reconstructions were also generated. COMPARISON:  None. FINDINGS: Alignment: Normal. Skull base and vertebrae: No acute fracture. No primary bone lesion or focal pathologic process. Soft tissues and spinal canal: No prevertebral fluid or swelling. No visible canal hematoma. Disc levels: There is narrowing of the intervertebral disc spaces at C5-C6 and C6-C7, with anterior and posterior disc osteophyte complexes, and underlying vacuum phenomenon. Facet disease is noted at the mid and lower  cervical spine. Diffuse degenerative change is seen about the dens. Upper chest: 6 mm nodules are noted at both lung apices. Underlying emphysema is noted. Mild scarring is noted at the lung apices. The thyroid gland is unremarkable in appearance. Mild calcification is noted at the carotid bifurcations bilaterally. Other: The visualized portions of the brain are grossly unremarkable. Cerumen is noted filling the external auditory canals bilaterally, though there is a somewhat prominent skin tag noted more superficially at the left external auditory canal. There appears to be a small Zenker's diverticulum posterior to the superior aspect of the esophagus, containing air. IMPRESSION: 1. No evidence of fracture or subluxation along the cervical spine. 2. Mild degenerative change along the lower cervical spine. 3. **An incidental finding of potential clinical significance has been found. 6 mm pulmonary nodules noted at both lung apices. Non-contrast chest CT at 3-6 months is recommended. If the nodules are stable at time of repeat CT, then future CT at 18-24 months (from today's scan) is considered optional for low-risk patients, but is recommended for high-risk patients. This recommendation follows the consensus statement: Guidelines for Management of Incidental Pulmonary Nodules Detected on CT Images: From the Fleischner Society 2017; Radiology 2017; 284:228-243.** 4. Underlying emphysema and mild scarring at the lung apices. 5. Mild calcification at the carotid bifurcations bilaterally. 6. Cerumen noted filling the external auditory canals bilaterally, though there is a somewhat prominent skin tag noted more superficially at the left external auditory canal. 7. Apparent small Zenker's diverticulum noted posterior to the superior aspect of the esophagus, containing air. Electronically Signed   By: Garald Balding M.D.   On: 08/19/2016 21:47   Dg Shoulder Left  Result Date: 08/19/2016 CLINICAL DATA:  Left shoulder  pain, status post fall EXAM: LEFT SHOULDER - 2+ VIEW COMPARISON:  None. FINDINGS: Mid clavicular fracture. No fracture is seen involving the humerus. The joint spaces are preserved. Visualized soft tissues are within normal limits. Visualized left lung is clear. IMPRESSION: Mid clavicular fracture. Electronically Signed   By: Julian Hy M.D.   On: 08/19/2016 20:03   Dg Knee Complete 4 Views Left  Result Date: 08/19/2016 CLINICAL DATA:  Status post fall down eight stairs, with  left knee pain. Initial encounter. EXAM: LEFT KNEE - COMPLETE 4+ VIEW COMPARISON:  None. FINDINGS: There is no definite evidence of fracture or dislocation. Mild marginal osteophyte formation is noted at the medial compartment. Mild chondrocalcinosis is noted. Slight cortical irregularity about the inferior patella is thought to be chronic in nature, though would correlate clinically for any evidence of patellar fracture. No significant joint effusion is seen. The visualized soft tissues are normal in appearance. IMPRESSION: 1. No definite evidence of acute fracture or dislocation. 2. Slight cortical irregularity about the inferior patella is thought to be chronic in nature, though would correlate clinically for any evidence of patellar fracture. 3. Mild degenerative change at the medial compartment, with underlying chondrocalcinosis. Electronically Signed   By: Garald Balding M.D.   On: 08/19/2016 22:17   Dg Foot Complete Right  Result Date: 08/20/2016 CLINICAL DATA:  Fall with pain EXAM: RIGHT FOOT COMPLETE - 3+ VIEW COMPARISON:  None. FINDINGS: Osteopenia. No fracture or malalignment. Prominent soft tissue calcification posterior to the ankle. IMPRESSION: No acute osseous abnormality. Electronically Signed   By: Donavan Foil M.D.   On: 08/20/2016 00:05    Review of Systems  Constitutional: Negative for weight loss.  HENT: Negative for ear discharge, ear pain, hearing loss and tinnitus.   Eyes: Negative for blurred  vision, double vision, photophobia and pain.  Respiratory: Negative for cough, sputum production and shortness of breath.   Cardiovascular: Negative for chest pain.  Gastrointestinal: Negative for abdominal pain, nausea and vomiting.  Genitourinary: Negative for dysuria, flank pain, frequency and urgency.  Musculoskeletal: Positive for joint pain (Left shoulder and right foot) and neck pain. Negative for back pain, falls and myalgias.  Neurological: Negative for dizziness, tingling, sensory change, focal weakness, loss of consciousness and headaches.  Endo/Heme/Allergies: Does not bruise/bleed easily.  Psychiatric/Behavioral: Negative for depression, memory loss and substance abuse. The patient is not nervous/anxious.    Blood pressure (!) 159/82, pulse 96, temperature 97.3 F (36.3 C), temperature source Oral, resp. rate 18, height '5\' 3"'  (1.6 m), weight 45.4 kg (100 lb), SpO2 91 %. Physical Exam  Constitutional: She appears well-developed and well-nourished. No distress.  HENT:  Head: Normocephalic.  Eyes: Conjunctivae are normal. Right eye exhibits no discharge. Left eye exhibits no discharge. No scleral icterus.  Cardiovascular: Normal rate and regular rhythm.   Respiratory: Effort normal. No respiratory distress.  Musculoskeletal:  Right shoulder, elbow, wrist, digits- no skin wounds, nontender, no instability, no blocks to motion  Sens  Ax/R/M/U intact  Mot   Ax/ R/ PIN/ M/ AIN/ U intact  Rad 2+  Left shoulder, elbow, wrist, digits- no skin wounds, TTP clavicle, no instability, no blocks to motion  Sens  Ax//M/U intact, R paresthetic  Mot   Ax/ R/ PIN/ M/ AIN/ U intact  Rad 2+  RLE No traumatic wounds, ecchymosis, or rash  TTP plantar forefoot  No effusions  Knee stable to varus/ valgus and anterior/posterior stress  Sens DPN, SPN, TN intact  Motor EHL, ext, flex, evers 5/5  DP 2+, PT 2+, No significant edema   LLE No traumatic wounds, ecchymosis, or rash  Nontender  No  effusions  Knee stable to varus/ valgus and anterior/posterior stress  Sens DPN, SPN, TN intact  Motor EHL, ext, flex, evers 5/5  DP 2+, PT 2+, No significant edema  Neurological: She is alert.  Skin: Skin is warm and dry. She is not diaphoretic.  Psychiatric: She has a normal mood and affect. Her  behavior is normal.    Assessment/Plan: Fall Left midshaft clavicle fx -- Displaced only ~50% so should do well in sling with non-operative management. NWB. Will f/u with Dr. Victorino December as an outpatient in about 2 weeks. Left knee pain -- This seems to have resolved. I do not feel the radiographic irregularity is of consequence. WBAT. Right foot pain -- Contusion vs strain. Can still WBAT.    Lisette Abu, PA-C Orthopedic Surgery [phone] 08/20/2016, 10:24 AM

## 2016-08-20 NOTE — ED Notes (Signed)
Patient transported to CT 

## 2016-08-23 ENCOUNTER — Emergency Department (HOSPITAL_COMMUNITY)
Admission: EM | Admit: 2016-08-23 | Discharge: 2016-08-24 | Disposition: A | Discharge status: Home / Self Care | Payer: Medicare Other | Attending: Emergency Medicine | Admitting: Emergency Medicine

## 2016-08-23 ENCOUNTER — Encounter (HOSPITAL_COMMUNITY): Payer: Self-pay | Admitting: Nurse Practitioner

## 2016-08-23 DIAGNOSIS — Z79899 Other long term (current) drug therapy: Secondary | ICD-10-CM | POA: Insufficient documentation

## 2016-08-23 DIAGNOSIS — Z7901 Long term (current) use of anticoagulants: Secondary | ICD-10-CM | POA: Diagnosis not present

## 2016-08-23 DIAGNOSIS — I10 Essential (primary) hypertension: Secondary | ICD-10-CM | POA: Diagnosis not present

## 2016-08-23 DIAGNOSIS — J449 Chronic obstructive pulmonary disease, unspecified: Secondary | ICD-10-CM | POA: Diagnosis not present

## 2016-08-23 DIAGNOSIS — R410 Disorientation, unspecified: Secondary | ICD-10-CM | POA: Insufficient documentation

## 2016-08-23 DIAGNOSIS — F17210 Nicotine dependence, cigarettes, uncomplicated: Secondary | ICD-10-CM | POA: Insufficient documentation

## 2016-08-23 NOTE — ED Triage Notes (Signed)
Pt has been [facility] where is admitted for fall related injury/clavical fracture, reportedly she was combative towards staff members and refused to follow directions. Transfer paper work states "she is a danger to self and staff; needs psychosis evaluation."

## 2016-08-23 NOTE — ED Notes (Signed)
Bed: [id number] Expected date:  Expected time:  Means of arrival:  Comments: EMS/FALL

## 2016-08-23 NOTE — ED Provider Notes (Signed)
[facility]-EMERGENCY DEPT Provider Note   CSN: [id number] Arrival date & time: 08/23/16  2310  By signing my name below, I, Ny'Kea Lewis, attest that this documentation has been prepared under the direction and in the presence of Derwood KaplanNanavati, Karielle Davidow, MD. Electronically Signed: Karren CobbleNy'Kea Lewis, ED Scribe. 08/24/16. 12:37 AM.  History   Chief Complaint Chief Complaint  Patient presents with  . Psychiatric Evaluation   The history is provided by the patient. No language interpreter was used.    HPI Comments: [name] is a 76 y.o. female with a PMHx of atrial fib, COPD, and HTNbrought in by ambulance, who presents to the Emergency Department for psychiatric evaluation per facility. Per EMS, pt became combative tonight towards staff members and refused to follow directions, which is unlike her. Pt notes "she does not like to follow their direction" but denies combative behavior. No physical complaints noted at this time. Denies dysuria or hematuria.  Past Medical History:  Diagnosis Date  . Atrial fibrillation ([facility])   . Clavicle fracture 08/19/2016   left from fall at home   . COPD (chronic obstructive pulmonary disease) ([facility])   . Hypertension   . VAGINITIS, ATROPHIC 05/06/2006   Qualifier: Diagnosis of  By: Haydee SalterKivett, Whitney    . ZENKER'S DIVERTICULUM 10/04/2006   Qualifier: Diagnosis of  By: Georgiana ShoreMayans  MD, Vernona RiegerLaura     Patient Active Problem List   Diagnosis Date Noted  . Fall 08/20/2016  . Closed displaced fracture of lateral end of left clavicle   . Closed nondisplaced fracture of left patella   . Cerumen impaction 06/27/2015  . Encounter for therapeutic drug monitoring 05/08/2013  . Dupuytren's contracture of hand 07/22/2011  . Atrial fibrillation ([facility]) 04/09/2010  . CAROTID BRUIT 11/07/2008  . Osteoporosis 11/06/2008  . TOBACCO DEPENDENCE 05/06/2006  . HYPERTENSION, BENIGN SYSTEMIC 05/06/2006  . COPD (chronic obstructive pulmonary disease) ([facility]) 05/06/2006  . ARTHRITIS 05/06/2006    Past Surgical History:  Procedure Laterality Date  . HERNIA REPAIR      OB History    No data available     Home Medications    Prior to Admission medications   Medication Sig Start Date End Date Taking? Authorizing Provider  acetaminophen (TYLENOL) 325 MG tablet Take 2 tablets (650 mg total) by mouth every 6 (six) hours as needed for mild pain (or Fever >/= 101). 08/20/16  Yes Jeneen RinksYoo, Elsia J, DO  albuterol (PROVENTIL HFA) 108 (90 Base) MCG/ACT inhaler Inhale 2 puffs into the lungs every 4 (four) hours as needed for wheezing. Inhale 2 puff using inhaler every four hours 12/31/15  Yes Mikell, Antionette PolesAsiyah Zahra, MD  diltiazem (CARDIZEM) 90 MG tablet TAKE ONE TABLET BY MOUTH 4 TIMES DAILY Patient taking differently: TAKE ONE TABLET BY MOUTH 4 TIMES DAILY FOR CHEST PAIN 12/17/14  Yes Myra RudeSchmitz, Jeremy E, MD  Fluticasone-Salmeterol (ADVAIR DISKUS) 500-50 MCG/DOSE AEPB Inhale 1 puff into the lungs 2 (two) times daily. 12/31/15  Yes Mikell, Antionette PolesAsiyah Zahra, MD  ipratropium (ATROVENT HFA) 17 MCG/ACT inhaler Inhale 2 puffs into the lungs every 6 (six) hours. Patient taking differently: Inhale 2 puffs into the lungs every 6 (six) hours as needed for wheezing.  05/02/14 08/23/16 Yes Myra RudeSchmitz, Jeremy E, MD  lisinopril (PRINIVIL,ZESTRIL) 10 MG tablet TAKE ONE TABLET BY MOUTH ONCE DAILY Patient taking differently: TAKE 10 MG BY MOUTH ONCE DAILY 07/31/16  Yes Mikell, Antionette PolesAsiyah Zahra, MD  metoprolol succinate (TOPROL-XL) 25 MG 24 hr tablet TAKE ONE TABLET BY MOUTH ONCE DAILY Patient taking  differently: TAKE 25 MG BY MOUTH ONCE DAILY 07/02/16  Yes Mikell, Antionette Poles, MD  nicotine (NICODERM CQ - DOSED IN MG/24 HOURS) 14 mg/24hr patch Place 7 mg onto the skin daily.   Yes [provider]  Vitamin D, Ergocalciferol, (DRISDOL) 50000 units CAPS capsule Take 1 capsule (50,000 Units total) by mouth every 7 (seven) days. 07/09/15  Yes Myra Rude, MD  warfarin (COUMADIN) 2.5 MG tablet Take 2.5 mg by mouth daily. Take  2.5mg  tablet along with 3mg  tablet to equal 5.5mg  by mouth daily.   Yes [provider]  warfarin (COUMADIN) 3 MG tablet Take 3 mg by mouth daily. Take 3mg  along with 2.5mg  tablet by mouth to equal 5mg  by mouth daily   Yes [provider]  alendronate (FOSAMAX) 70 MG tablet TAKE ONE TABLET BY MOUTH ONCE EVERY 7 DAYS. TAKE WITH A FULL GLASS OF WATER ON AN EMPTY STOMACH Patient not taking: Reported on 08/23/2016 06/04/16   Berton Bon, MD  warfarin (COUMADIN) 5 MG tablet TAKE ONE TABLET BY MOUTH AS DIRECTED BY  COUMADIN  CLINIC Patient not taking: Reported on 08/23/2016 05/18/16   Berton Bon, MD   Family History History reviewed. No pertinent family history.  Social History Social History  Substance Use Topics  . Smoking status: Current Every Day Smoker    Packs/day: 1.00    Types: Cigarettes  . Smokeless tobacco: Never Used     Comment: would like to try to quitt after 54 years!!!!!  . Alcohol use Yes     Comment: 1 glass of wine every day    Allergies   Doxycycline  Review of Systems Review of Systems  Genitourinary: Negative for dysuria and hematuria.  Psychiatric/Behavioral: Negative for behavioral problems.  All other systems reviewed and are negative.  Physical Exam Updated Vital Signs BP (!) 167/82 (BP Location: Left Arm)   Pulse (!) 46   Temp 98.3 F (36.8 C) (Oral)   Resp 18   SpO2 90%   Physical Exam  Constitutional: She appears well-developed and well-nourished.  HENT:  Head: Normocephalic.  Eyes: EOM are normal.  Neck: Normal range of motion.  Cardiovascular: Normal rate and regular rhythm.   Pulmonary/Chest: Effort normal and breath sounds normal.  Abdominal: She exhibits no distension.  Musculoskeletal: Normal range of motion.  Neurological: She is alert.  Oriented to self and location.  Skin:  Large bruise to left chest area.  Psychiatric: She has a normal mood and affect.  Nursing note and vitals reviewed.  ED  Treatments / Results  DIAGNOSTIC STUDIES: Oxygen Saturation is 90% on RA, low by my interpretation.   COORDINATION OF CARE: 12:03 AM-Discussed next steps with pt. Pt verbalized understanding and is agreeable with the plan.   Labs (all labs ordered are listed, but only abnormal results are displayed) Labs Reviewed - No data to display  EKG  EKG Interpretation None      Radiology No results found.  Procedures Procedures (including critical care time)  Medications Ordered in ED Medications - No data to display  Initial Impression / Assessment and Plan / ED Course  I have reviewed the triage vital signs and the nursing notes.  Pertinent labs & imaging results that were available during my care of the patient were reviewed by me and considered in my medical decision making (see chart for details).  Clinical Course as of Aug 25 123  Mon Aug 24, 2016  0125 Still calm and cooperative. Sleeping comfortably.  Will d/c.  [AN]    Clinical Course User Index [AN] Derwood Kaplan, MD   Pt comes in with cc of psycohoses from the nursing home. She was just admitted to the rehab facility. It seems that she became combative this evening. Pt is calm, collected and cooperative in the ER. She has no complains. She doesn't recall getting into altercation, but does inform me that she doesn't like being told what she can and she can't do.  Pt is not psychotic by any means at the moment. She could have had acute delirium earlier that led to her being aggressive. I dont think pt needs emergent psych eval or any labs.   Final Clinical Impressions(s) / ED Diagnoses   Final diagnoses:  Delirium   New Prescriptions New Prescriptions   No medications on file   I personally performed the services described in this documentation, which was scribed in my presence. The recorded information has been reviewed and is accurate.    Derwood Kaplan, MD 08/24/16 [phone]

## 2016-08-24 DIAGNOSIS — R2681 Unsteadiness on feet: Secondary | ICD-10-CM | POA: Diagnosis not present

## 2016-08-24 DIAGNOSIS — Z5189 Encounter for other specified aftercare: Secondary | ICD-10-CM | POA: Diagnosis not present

## 2016-08-24 DIAGNOSIS — M25512 Pain in left shoulder: Secondary | ICD-10-CM | POA: Diagnosis not present

## 2016-08-24 DIAGNOSIS — M25562 Pain in left knee: Secondary | ICD-10-CM | POA: Diagnosis not present

## 2016-08-24 DIAGNOSIS — M6281 Muscle weakness (generalized): Secondary | ICD-10-CM | POA: Diagnosis not present

## 2016-08-24 MED ORDER — STERILE WATER FOR INJECTION IJ SOLN
INTRAMUSCULAR | Status: AC
  Filled 2016-08-24: qty 10

## 2016-08-24 NOTE — Discharge Instructions (Signed)
[name] is calm in the ER and cooperative. We suspect that her symptoms were due to DELIRIUM - and not any acute psychoses.   [name] has a collar bone fracture on the left side, and she came to the ER without being in a sling. Her sling needs to beo n 24/7.

## 2016-08-25 DIAGNOSIS — R2681 Unsteadiness on feet: Secondary | ICD-10-CM | POA: Diagnosis not present

## 2016-08-25 DIAGNOSIS — Z5189 Encounter for other specified aftercare: Secondary | ICD-10-CM | POA: Diagnosis not present

## 2016-08-25 DIAGNOSIS — M25562 Pain in left knee: Secondary | ICD-10-CM | POA: Diagnosis not present

## 2016-08-25 DIAGNOSIS — M6281 Muscle weakness (generalized): Secondary | ICD-10-CM | POA: Diagnosis not present

## 2016-08-25 DIAGNOSIS — M25512 Pain in left shoulder: Secondary | ICD-10-CM | POA: Diagnosis not present

## 2016-08-26 DIAGNOSIS — M25562 Pain in left knee: Secondary | ICD-10-CM | POA: Diagnosis not present

## 2016-08-26 DIAGNOSIS — Z5189 Encounter for other specified aftercare: Secondary | ICD-10-CM | POA: Diagnosis not present

## 2016-08-26 DIAGNOSIS — R2681 Unsteadiness on feet: Secondary | ICD-10-CM | POA: Diagnosis not present

## 2016-08-26 DIAGNOSIS — M25512 Pain in left shoulder: Secondary | ICD-10-CM | POA: Diagnosis not present

## 2016-08-26 DIAGNOSIS — M6281 Muscle weakness (generalized): Secondary | ICD-10-CM | POA: Diagnosis not present

## 2016-08-28 DIAGNOSIS — M25512 Pain in left shoulder: Secondary | ICD-10-CM | POA: Diagnosis not present

## 2016-08-28 DIAGNOSIS — R2681 Unsteadiness on feet: Secondary | ICD-10-CM | POA: Diagnosis not present

## 2016-08-28 DIAGNOSIS — M25562 Pain in left knee: Secondary | ICD-10-CM | POA: Diagnosis not present

## 2016-08-28 DIAGNOSIS — Z5189 Encounter for other specified aftercare: Secondary | ICD-10-CM | POA: Diagnosis not present

## 2016-08-28 DIAGNOSIS — M6281 Muscle weakness (generalized): Secondary | ICD-10-CM | POA: Diagnosis not present

## 2016-08-31 DIAGNOSIS — M25562 Pain in left knee: Secondary | ICD-10-CM | POA: Diagnosis not present

## 2016-08-31 DIAGNOSIS — M25512 Pain in left shoulder: Secondary | ICD-10-CM | POA: Diagnosis not present

## 2016-08-31 DIAGNOSIS — M6281 Muscle weakness (generalized): Secondary | ICD-10-CM | POA: Diagnosis not present

## 2016-08-31 DIAGNOSIS — Z5189 Encounter for other specified aftercare: Secondary | ICD-10-CM | POA: Diagnosis not present

## 2016-08-31 DIAGNOSIS — R2681 Unsteadiness on feet: Secondary | ICD-10-CM | POA: Diagnosis not present

## 2016-09-01 DIAGNOSIS — M6281 Muscle weakness (generalized): Secondary | ICD-10-CM | POA: Diagnosis not present

## 2016-09-01 DIAGNOSIS — M25562 Pain in left knee: Secondary | ICD-10-CM | POA: Diagnosis not present

## 2016-09-01 DIAGNOSIS — M25512 Pain in left shoulder: Secondary | ICD-10-CM | POA: Diagnosis not present

## 2016-09-01 DIAGNOSIS — R2681 Unsteadiness on feet: Secondary | ICD-10-CM | POA: Diagnosis not present

## 2016-09-01 DIAGNOSIS — L02416 Cutaneous abscess of left lower limb: Secondary | ICD-10-CM | POA: Diagnosis not present

## 2016-09-02 DIAGNOSIS — M25512 Pain in left shoulder: Secondary | ICD-10-CM | POA: Diagnosis not present

## 2016-09-02 DIAGNOSIS — R2681 Unsteadiness on feet: Secondary | ICD-10-CM | POA: Diagnosis not present

## 2016-09-02 DIAGNOSIS — M25562 Pain in left knee: Secondary | ICD-10-CM | POA: Diagnosis not present

## 2016-09-02 DIAGNOSIS — M6281 Muscle weakness (generalized): Secondary | ICD-10-CM | POA: Diagnosis not present

## 2016-09-02 DIAGNOSIS — L02416 Cutaneous abscess of left lower limb: Secondary | ICD-10-CM | POA: Diagnosis not present

## 2016-09-03 DIAGNOSIS — S82002G Unspecified fracture of left patella, subsequent encounter for closed fracture with delayed healing: Secondary | ICD-10-CM | POA: Diagnosis not present

## 2016-09-03 DIAGNOSIS — L03116 Cellulitis of left lower limb: Secondary | ICD-10-CM | POA: Diagnosis not present

## 2016-09-03 DIAGNOSIS — Y92129 Unspecified place in nursing home as the place of occurrence of the external cause: Secondary | ICD-10-CM | POA: Diagnosis not present

## 2016-09-03 DIAGNOSIS — S42002D Fracture of unspecified part of left clavicle, subsequent encounter for fracture with routine healing: Secondary | ICD-10-CM | POA: Diagnosis not present

## 2016-09-04 DIAGNOSIS — S42035A Nondisplaced fracture of lateral end of left clavicle, initial encounter for closed fracture: Secondary | ICD-10-CM | POA: Diagnosis not present

## 2016-09-07 DIAGNOSIS — I4891 Unspecified atrial fibrillation: Secondary | ICD-10-CM | POA: Diagnosis not present

## 2016-09-07 DIAGNOSIS — R262 Difficulty in walking, not elsewhere classified: Secondary | ICD-10-CM | POA: Diagnosis not present

## 2016-09-07 DIAGNOSIS — M129 Arthropathy, unspecified: Secondary | ICD-10-CM | POA: Diagnosis not present

## 2016-09-07 DIAGNOSIS — W19XXXA Unspecified fall, initial encounter: Secondary | ICD-10-CM | POA: Diagnosis not present

## 2016-09-07 DIAGNOSIS — M6281 Muscle weakness (generalized): Secondary | ICD-10-CM | POA: Diagnosis not present

## 2016-09-07 DIAGNOSIS — J449 Chronic obstructive pulmonary disease, unspecified: Secondary | ICD-10-CM | POA: Diagnosis not present

## 2016-09-07 DIAGNOSIS — I10 Essential (primary) hypertension: Secondary | ICD-10-CM | POA: Diagnosis not present

## 2016-09-10 DIAGNOSIS — R262 Difficulty in walking, not elsewhere classified: Secondary | ICD-10-CM | POA: Diagnosis not present

## 2016-09-10 DIAGNOSIS — W19XXXA Unspecified fall, initial encounter: Secondary | ICD-10-CM | POA: Diagnosis not present

## 2016-09-10 DIAGNOSIS — I4891 Unspecified atrial fibrillation: Secondary | ICD-10-CM | POA: Diagnosis not present

## 2016-09-10 DIAGNOSIS — I10 Essential (primary) hypertension: Secondary | ICD-10-CM | POA: Diagnosis not present

## 2016-09-10 DIAGNOSIS — J449 Chronic obstructive pulmonary disease, unspecified: Secondary | ICD-10-CM | POA: Diagnosis not present

## 2016-09-10 DIAGNOSIS — M129 Arthropathy, unspecified: Secondary | ICD-10-CM | POA: Diagnosis not present

## 2016-09-10 DIAGNOSIS — M6281 Muscle weakness (generalized): Secondary | ICD-10-CM | POA: Diagnosis not present

## 2016-09-11 ENCOUNTER — Telehealth: Payer: Self-pay | Admitting: Internal Medicine

## 2016-09-11 NOTE — Telephone Encounter (Signed)
Will forward to MD to advise. Jazmin Hartsell,CMA  

## 2016-09-11 NOTE — Telephone Encounter (Signed)
[facility] is calling for verbal orders to get PT and home health for the patient. 2 times a week for 4 weeks starting next week. Then 1 time a week fir 1 week. Please call Liji with the verbal orders. jw

## 2016-09-14 ENCOUNTER — Telehealth: Payer: Self-pay | Admitting: Internal Medicine

## 2016-09-14 DIAGNOSIS — W19XXXA Unspecified fall, initial encounter: Secondary | ICD-10-CM | POA: Diagnosis not present

## 2016-09-14 DIAGNOSIS — I10 Essential (primary) hypertension: Secondary | ICD-10-CM | POA: Diagnosis not present

## 2016-09-14 DIAGNOSIS — M6281 Muscle weakness (generalized): Secondary | ICD-10-CM | POA: Diagnosis not present

## 2016-09-14 DIAGNOSIS — J449 Chronic obstructive pulmonary disease, unspecified: Secondary | ICD-10-CM | POA: Diagnosis not present

## 2016-09-14 DIAGNOSIS — I4891 Unspecified atrial fibrillation: Secondary | ICD-10-CM | POA: Diagnosis not present

## 2016-09-14 DIAGNOSIS — R262 Difficulty in walking, not elsewhere classified: Secondary | ICD-10-CM | POA: Diagnosis not present

## 2016-09-14 DIAGNOSIS — M129 Arthropathy, unspecified: Secondary | ICD-10-CM | POA: Diagnosis not present

## 2016-09-14 NOTE — Telephone Encounter (Signed)
Please provide verbal order for PT

## 2016-09-14 NOTE — Telephone Encounter (Signed)
Please have patient come in to be evaluated, if concern for mild COPD flare.

## 2016-09-14 NOTE — Telephone Encounter (Signed)
Home health nurse with [facility]: pt heart rate today was 116. She has expiratory wheezing to bilateral upper lung lobe.  She has a non productive cough.  She was just put on augumentin for a lower leg wound.

## 2016-09-14 NOTE — Telephone Encounter (Signed)
Liji informed. Deseree Bruna PotterBlount, CMA

## 2016-09-15 DIAGNOSIS — M6281 Muscle weakness (generalized): Secondary | ICD-10-CM | POA: Diagnosis not present

## 2016-09-15 DIAGNOSIS — M129 Arthropathy, unspecified: Secondary | ICD-10-CM | POA: Diagnosis not present

## 2016-09-15 DIAGNOSIS — R262 Difficulty in walking, not elsewhere classified: Secondary | ICD-10-CM | POA: Diagnosis not present

## 2016-09-15 DIAGNOSIS — I10 Essential (primary) hypertension: Secondary | ICD-10-CM | POA: Diagnosis not present

## 2016-09-15 DIAGNOSIS — I4891 Unspecified atrial fibrillation: Secondary | ICD-10-CM | POA: Diagnosis not present

## 2016-09-15 DIAGNOSIS — J449 Chronic obstructive pulmonary disease, unspecified: Secondary | ICD-10-CM | POA: Diagnosis not present

## 2016-09-15 DIAGNOSIS — W19XXXA Unspecified fall, initial encounter: Secondary | ICD-10-CM | POA: Diagnosis not present

## 2016-09-17 DIAGNOSIS — M6281 Muscle weakness (generalized): Secondary | ICD-10-CM | POA: Diagnosis not present

## 2016-09-17 DIAGNOSIS — I10 Essential (primary) hypertension: Secondary | ICD-10-CM | POA: Diagnosis not present

## 2016-09-17 DIAGNOSIS — I4891 Unspecified atrial fibrillation: Secondary | ICD-10-CM | POA: Diagnosis not present

## 2016-09-17 DIAGNOSIS — M129 Arthropathy, unspecified: Secondary | ICD-10-CM | POA: Diagnosis not present

## 2016-09-17 DIAGNOSIS — J449 Chronic obstructive pulmonary disease, unspecified: Secondary | ICD-10-CM | POA: Diagnosis not present

## 2016-09-17 DIAGNOSIS — W19XXXA Unspecified fall, initial encounter: Secondary | ICD-10-CM | POA: Diagnosis not present

## 2016-09-17 DIAGNOSIS — R262 Difficulty in walking, not elsewhere classified: Secondary | ICD-10-CM | POA: Diagnosis not present

## 2016-09-18 NOTE — Telephone Encounter (Signed)
LM for patient to call back. Jazmin Hartsell,CMA  

## 2016-09-21 DIAGNOSIS — M6281 Muscle weakness (generalized): Secondary | ICD-10-CM | POA: Diagnosis not present

## 2016-09-21 DIAGNOSIS — Z9181 History of falling: Secondary | ICD-10-CM | POA: Diagnosis not present

## 2016-09-21 DIAGNOSIS — S42032D Displaced fracture of lateral end of left clavicle, subsequent encounter for fracture with routine healing: Secondary | ICD-10-CM | POA: Diagnosis not present

## 2016-09-21 DIAGNOSIS — J449 Chronic obstructive pulmonary disease, unspecified: Secondary | ICD-10-CM | POA: Diagnosis not present

## 2016-09-21 DIAGNOSIS — I4891 Unspecified atrial fibrillation: Secondary | ICD-10-CM | POA: Diagnosis not present

## 2016-09-21 DIAGNOSIS — S81812D Laceration without foreign body, left lower leg, subsequent encounter: Secondary | ICD-10-CM | POA: Diagnosis not present

## 2016-09-21 DIAGNOSIS — I10 Essential (primary) hypertension: Secondary | ICD-10-CM | POA: Diagnosis not present

## 2016-09-21 DIAGNOSIS — Z7901 Long term (current) use of anticoagulants: Secondary | ICD-10-CM | POA: Diagnosis not present

## 2016-09-21 DIAGNOSIS — M810 Age-related osteoporosis without current pathological fracture: Secondary | ICD-10-CM | POA: Diagnosis not present

## 2016-09-21 DIAGNOSIS — S82009D Unspecified fracture of unspecified patella, subsequent encounter for closed fracture with routine healing: Secondary | ICD-10-CM | POA: Diagnosis not present

## 2016-09-21 NOTE — Telephone Encounter (Signed)
LM for nicole asking her to call back.  Patient hasn't been seen in the office with a provider since fall of 2017. Seymore Brodowski,CMA

## 2016-09-21 NOTE — Telephone Encounter (Signed)
Spoke with nicole and she plans to see patient tomorrow and will have her call and make an appt. Jazmin Hartsell,CMA

## 2016-09-22 DIAGNOSIS — M6281 Muscle weakness (generalized): Secondary | ICD-10-CM | POA: Diagnosis not present

## 2016-09-22 DIAGNOSIS — S82009D Unspecified fracture of unspecified patella, subsequent encounter for closed fracture with routine healing: Secondary | ICD-10-CM | POA: Diagnosis not present

## 2016-09-22 DIAGNOSIS — Z9181 History of falling: Secondary | ICD-10-CM | POA: Diagnosis not present

## 2016-09-22 DIAGNOSIS — S42032D Displaced fracture of lateral end of left clavicle, subsequent encounter for fracture with routine healing: Secondary | ICD-10-CM | POA: Diagnosis not present

## 2016-09-22 DIAGNOSIS — I4891 Unspecified atrial fibrillation: Secondary | ICD-10-CM | POA: Diagnosis not present

## 2016-09-22 DIAGNOSIS — I10 Essential (primary) hypertension: Secondary | ICD-10-CM | POA: Diagnosis not present

## 2016-09-22 DIAGNOSIS — M810 Age-related osteoporosis without current pathological fracture: Secondary | ICD-10-CM | POA: Diagnosis not present

## 2016-09-22 DIAGNOSIS — J449 Chronic obstructive pulmonary disease, unspecified: Secondary | ICD-10-CM | POA: Diagnosis not present

## 2016-09-22 DIAGNOSIS — Z7901 Long term (current) use of anticoagulants: Secondary | ICD-10-CM | POA: Diagnosis not present

## 2016-09-22 DIAGNOSIS — S81812D Laceration without foreign body, left lower leg, subsequent encounter: Secondary | ICD-10-CM | POA: Diagnosis not present

## 2016-09-24 DIAGNOSIS — M810 Age-related osteoporosis without current pathological fracture: Secondary | ICD-10-CM | POA: Diagnosis not present

## 2016-09-24 DIAGNOSIS — Z7901 Long term (current) use of anticoagulants: Secondary | ICD-10-CM | POA: Diagnosis not present

## 2016-09-24 DIAGNOSIS — Z9181 History of falling: Secondary | ICD-10-CM | POA: Diagnosis not present

## 2016-09-24 DIAGNOSIS — S82009D Unspecified fracture of unspecified patella, subsequent encounter for closed fracture with routine healing: Secondary | ICD-10-CM | POA: Diagnosis not present

## 2016-09-24 DIAGNOSIS — I4891 Unspecified atrial fibrillation: Secondary | ICD-10-CM | POA: Diagnosis not present

## 2016-09-24 DIAGNOSIS — S42032D Displaced fracture of lateral end of left clavicle, subsequent encounter for fracture with routine healing: Secondary | ICD-10-CM | POA: Diagnosis not present

## 2016-09-24 DIAGNOSIS — S81812D Laceration without foreign body, left lower leg, subsequent encounter: Secondary | ICD-10-CM | POA: Diagnosis not present

## 2016-09-24 DIAGNOSIS — J449 Chronic obstructive pulmonary disease, unspecified: Secondary | ICD-10-CM | POA: Diagnosis not present

## 2016-09-24 DIAGNOSIS — I10 Essential (primary) hypertension: Secondary | ICD-10-CM | POA: Diagnosis not present

## 2016-09-24 DIAGNOSIS — M6281 Muscle weakness (generalized): Secondary | ICD-10-CM | POA: Diagnosis not present

## 2016-09-28 DIAGNOSIS — J449 Chronic obstructive pulmonary disease, unspecified: Secondary | ICD-10-CM | POA: Diagnosis not present

## 2016-09-28 DIAGNOSIS — I10 Essential (primary) hypertension: Secondary | ICD-10-CM | POA: Diagnosis not present

## 2016-09-28 DIAGNOSIS — Z7901 Long term (current) use of anticoagulants: Secondary | ICD-10-CM | POA: Diagnosis not present

## 2016-09-28 DIAGNOSIS — Z9181 History of falling: Secondary | ICD-10-CM | POA: Diagnosis not present

## 2016-09-28 DIAGNOSIS — M810 Age-related osteoporosis without current pathological fracture: Secondary | ICD-10-CM | POA: Diagnosis not present

## 2016-09-28 DIAGNOSIS — I4891 Unspecified atrial fibrillation: Secondary | ICD-10-CM | POA: Diagnosis not present

## 2016-09-28 DIAGNOSIS — M6281 Muscle weakness (generalized): Secondary | ICD-10-CM | POA: Diagnosis not present

## 2016-09-28 DIAGNOSIS — S81812D Laceration without foreign body, left lower leg, subsequent encounter: Secondary | ICD-10-CM | POA: Diagnosis not present

## 2016-09-28 DIAGNOSIS — S82009D Unspecified fracture of unspecified patella, subsequent encounter for closed fracture with routine healing: Secondary | ICD-10-CM | POA: Diagnosis not present

## 2016-09-28 DIAGNOSIS — S42032D Displaced fracture of lateral end of left clavicle, subsequent encounter for fracture with routine healing: Secondary | ICD-10-CM | POA: Diagnosis not present

## 2016-10-01 DIAGNOSIS — Z7901 Long term (current) use of anticoagulants: Secondary | ICD-10-CM | POA: Diagnosis not present

## 2016-10-01 DIAGNOSIS — J449 Chronic obstructive pulmonary disease, unspecified: Secondary | ICD-10-CM | POA: Diagnosis not present

## 2016-10-01 DIAGNOSIS — I10 Essential (primary) hypertension: Secondary | ICD-10-CM | POA: Diagnosis not present

## 2016-10-01 DIAGNOSIS — I4891 Unspecified atrial fibrillation: Secondary | ICD-10-CM | POA: Diagnosis not present

## 2016-10-01 DIAGNOSIS — M810 Age-related osteoporosis without current pathological fracture: Secondary | ICD-10-CM | POA: Diagnosis not present

## 2016-10-01 DIAGNOSIS — M6281 Muscle weakness (generalized): Secondary | ICD-10-CM | POA: Diagnosis not present

## 2016-10-01 DIAGNOSIS — S81812D Laceration without foreign body, left lower leg, subsequent encounter: Secondary | ICD-10-CM | POA: Diagnosis not present

## 2016-10-01 DIAGNOSIS — S82009D Unspecified fracture of unspecified patella, subsequent encounter for closed fracture with routine healing: Secondary | ICD-10-CM | POA: Diagnosis not present

## 2016-10-01 DIAGNOSIS — Z9181 History of falling: Secondary | ICD-10-CM | POA: Diagnosis not present

## 2016-10-01 DIAGNOSIS — S42032D Displaced fracture of lateral end of left clavicle, subsequent encounter for fracture with routine healing: Secondary | ICD-10-CM | POA: Diagnosis not present

## 2016-10-06 DIAGNOSIS — J449 Chronic obstructive pulmonary disease, unspecified: Secondary | ICD-10-CM | POA: Diagnosis not present

## 2016-10-06 DIAGNOSIS — S81812D Laceration without foreign body, left lower leg, subsequent encounter: Secondary | ICD-10-CM | POA: Diagnosis not present

## 2016-10-06 DIAGNOSIS — Z9181 History of falling: Secondary | ICD-10-CM | POA: Diagnosis not present

## 2016-10-06 DIAGNOSIS — M810 Age-related osteoporosis without current pathological fracture: Secondary | ICD-10-CM | POA: Diagnosis not present

## 2016-10-06 DIAGNOSIS — I10 Essential (primary) hypertension: Secondary | ICD-10-CM | POA: Diagnosis not present

## 2016-10-06 DIAGNOSIS — S82009D Unspecified fracture of unspecified patella, subsequent encounter for closed fracture with routine healing: Secondary | ICD-10-CM | POA: Diagnosis not present

## 2016-10-06 DIAGNOSIS — Z7901 Long term (current) use of anticoagulants: Secondary | ICD-10-CM | POA: Diagnosis not present

## 2016-10-06 DIAGNOSIS — S42032D Displaced fracture of lateral end of left clavicle, subsequent encounter for fracture with routine healing: Secondary | ICD-10-CM | POA: Diagnosis not present

## 2016-10-06 DIAGNOSIS — M6281 Muscle weakness (generalized): Secondary | ICD-10-CM | POA: Diagnosis not present

## 2016-10-06 DIAGNOSIS — I4891 Unspecified atrial fibrillation: Secondary | ICD-10-CM | POA: Diagnosis not present

## 2016-10-09 DIAGNOSIS — M6281 Muscle weakness (generalized): Secondary | ICD-10-CM | POA: Diagnosis not present

## 2016-10-09 DIAGNOSIS — Z9181 History of falling: Secondary | ICD-10-CM | POA: Diagnosis not present

## 2016-10-09 DIAGNOSIS — S81812D Laceration without foreign body, left lower leg, subsequent encounter: Secondary | ICD-10-CM | POA: Diagnosis not present

## 2016-10-09 DIAGNOSIS — I10 Essential (primary) hypertension: Secondary | ICD-10-CM | POA: Diagnosis not present

## 2016-10-09 DIAGNOSIS — J449 Chronic obstructive pulmonary disease, unspecified: Secondary | ICD-10-CM | POA: Diagnosis not present

## 2016-10-09 DIAGNOSIS — M810 Age-related osteoporosis without current pathological fracture: Secondary | ICD-10-CM | POA: Diagnosis not present

## 2016-10-09 DIAGNOSIS — I4891 Unspecified atrial fibrillation: Secondary | ICD-10-CM | POA: Diagnosis not present

## 2016-10-09 DIAGNOSIS — Z7901 Long term (current) use of anticoagulants: Secondary | ICD-10-CM | POA: Diagnosis not present

## 2016-10-09 DIAGNOSIS — S42032D Displaced fracture of lateral end of left clavicle, subsequent encounter for fracture with routine healing: Secondary | ICD-10-CM | POA: Diagnosis not present

## 2016-10-09 DIAGNOSIS — S82009D Unspecified fracture of unspecified patella, subsequent encounter for closed fracture with routine healing: Secondary | ICD-10-CM | POA: Diagnosis not present

## 2016-10-14 DIAGNOSIS — M6281 Muscle weakness (generalized): Secondary | ICD-10-CM | POA: Diagnosis not present

## 2016-10-14 DIAGNOSIS — S81812D Laceration without foreign body, left lower leg, subsequent encounter: Secondary | ICD-10-CM | POA: Diagnosis not present

## 2016-10-14 DIAGNOSIS — I4891 Unspecified atrial fibrillation: Secondary | ICD-10-CM | POA: Diagnosis not present

## 2016-10-14 DIAGNOSIS — S42032D Displaced fracture of lateral end of left clavicle, subsequent encounter for fracture with routine healing: Secondary | ICD-10-CM | POA: Diagnosis not present

## 2016-10-14 DIAGNOSIS — J449 Chronic obstructive pulmonary disease, unspecified: Secondary | ICD-10-CM | POA: Diagnosis not present

## 2016-10-14 DIAGNOSIS — Z9181 History of falling: Secondary | ICD-10-CM | POA: Diagnosis not present

## 2016-10-14 DIAGNOSIS — M810 Age-related osteoporosis without current pathological fracture: Secondary | ICD-10-CM | POA: Diagnosis not present

## 2016-10-14 DIAGNOSIS — Z7901 Long term (current) use of anticoagulants: Secondary | ICD-10-CM | POA: Diagnosis not present

## 2016-10-14 DIAGNOSIS — I10 Essential (primary) hypertension: Secondary | ICD-10-CM | POA: Diagnosis not present

## 2016-10-14 DIAGNOSIS — S82009D Unspecified fracture of unspecified patella, subsequent encounter for closed fracture with routine healing: Secondary | ICD-10-CM | POA: Diagnosis not present

## 2016-10-16 ENCOUNTER — Encounter: Payer: Self-pay | Admitting: Internal Medicine

## 2016-10-16 ENCOUNTER — Ambulatory Visit (INDEPENDENT_AMBULATORY_CARE_PROVIDER_SITE_OTHER): Payer: Medicare Other | Admitting: Internal Medicine

## 2016-10-16 VITALS — BP 150/80 | HR 101 | Temp 97.6°F | Ht 63.0 in | Wt 90.2 lb

## 2016-10-16 DIAGNOSIS — I10 Essential (primary) hypertension: Secondary | ICD-10-CM

## 2016-10-16 DIAGNOSIS — J410 Simple chronic bronchitis: Secondary | ICD-10-CM | POA: Diagnosis not present

## 2016-10-16 DIAGNOSIS — J441 Chronic obstructive pulmonary disease with (acute) exacerbation: Secondary | ICD-10-CM | POA: Diagnosis not present

## 2016-10-16 DIAGNOSIS — E441 Mild protein-calorie malnutrition: Secondary | ICD-10-CM | POA: Diagnosis not present

## 2016-10-16 DIAGNOSIS — I480 Paroxysmal atrial fibrillation: Secondary | ICD-10-CM | POA: Diagnosis not present

## 2016-10-16 DIAGNOSIS — S42032D Displaced fracture of lateral end of left clavicle, subsequent encounter for fracture with routine healing: Secondary | ICD-10-CM

## 2016-10-16 MED ORDER — IPRATROPIUM BROMIDE HFA 17 MCG/ACT IN AERS: 2.0000 | INHALATION_SPRAY | Freq: Four times a day (QID) | RESPIRATORY_TRACT | 5 refills | Status: DC

## 2016-10-16 MED ORDER — FLUTICASONE-SALMETEROL 500-50 MCG/DOSE IN AEPB: 1.0000 | INHALATION_SPRAY | Freq: Two times a day (BID) | RESPIRATORY_TRACT | 11 refills | Status: DC

## 2016-10-16 MED ORDER — LISINOPRIL 10 MG PO TABS: 20.0000 mg | ORAL_TABLET | Freq: Every day | ORAL | 4 refills | Status: DC

## 2016-10-16 MED ORDER — LISINOPRIL 10 MG PO TABS: 10.0000 mg | ORAL_TABLET | Freq: Every day | ORAL | 4 refills | Status: DC

## 2016-10-16 MED ORDER — METOPROLOL SUCCINATE ER 25 MG PO TB24: 25.0000 mg | ORAL_TABLET | Freq: Every day | ORAL | 3 refills | Status: DC

## 2016-10-16 NOTE — Assessment & Plan Note (Signed)
Patient with a BMI of 15. She currently does not want any further intervention. Discussed my concern with patient that she could have cancer  - not interested in any type of screening for now - maybe some time in the future

## 2016-10-16 NOTE — Patient Instructions (Addendum)
I was nice seeing you today. Lets have you follow up with me in about 2 months. I am going to increase your lisinopril otherwise I think you are doing great.

## 2016-10-16 NOTE — Progress Notes (Signed)
   [facility] Noralee CharsAsiyah Yafet Cline, MD Phone: [phone]  Reason For Visit: COPD   COPD  Patient states she has SOB which is unchanged. Patient gets SOB walking to the mailbox and back, and going up a flight of stairs  - Denies any cough or sputum production  - Some days has wheezing, other days she does not  - Smoking a pack a day  - Atrovent, Advair  - Has never seen pulmonologist  - Did not bring medications  - At this point does not want to be evaluated for supplemental oxygen  Atrial fibrillation  - Currently on metropolol and warfarin  - Warfarin is managed by cardiology clinic  - No palpations, dizziness, no fatigue,  No bleeding   Social  - stays a home with son - who has arthritis, has a problem with pain meds per patient  - she cooks and cleans - is still able to get groceries, worries about her son a lot   Past Medical History Reviewed problem list.  Medications- reviewed and updated No additions to family history Social history- patient is a smoker - 1 pack a day   Objective: BP (!) 150/80 (BP Location: Right Arm, Patient Position: Sitting, Cuff Size: Normal)   Pulse (!) 101   Temp 97.6 F (36.4 C) (Oral)   Ht 5\' 3"  (1.6 m)   Wt 90 lb 4 oz (40.9 kg)   SpO2 92%   BMI 15.99 kg/m  Gen: NAD, alert, cooperative with exam Cardio: regular rate and rhythm, S1S2 heard, no murmurs appreciated Pulm: wheezing throughout lung fields on examination today, no increased WOB,  GI: soft, non-tender, non-distended, bowel sounds present, no hepatomegaly, no splenomegaly Skin: dry, intact, no rashes or lesions   Assessment/Plan: See problem based a/p  Mild malnutrition (HCC) Patient with a BMI of 15. She currently does not want any further intervention. Discussed my concern with patient that she could have cancer  - not interested in any type of screening for now - maybe some time in the future   HYPERTENSION, BENIGN SYSTEMIC Elevated blood pressure    Will increase lisinopril from 10 mg to 20 mg  Patient to follow up in about 2 months    Atrial fibrillation (HCC) Cardizem was stopped at last cardiology visit  Now on metropolol and warfarin (refilled by cardiology) who monitor patient's INR    Closed displaced fracture of lateral end of left clavicle Recent fall about one month ago, mechanical fall down the stairs. Patient was sent to rehabilitation for 20 days. Now patient is back at home. No concerns for any further resources per patient at this time  COPD (chronic obstructive pulmonary disease) (HCC) Currently on Advair, Atrovent and when necessary albuterol. Smokes 1 pack per day. On examination no increased work of breathing, pulse ox 92% at rest, wheezing throughout on lung field examination. Patient states that sometimes she has wheezes and sometimes she doesn't. She is not interested in being evaluated for oxygen needs she does not want home oxygen. We have discussed the need for PFTs for continued evaluation of her COPD progression at this point she wants to hold off and would just like to continue her current medication regimen. -Refill Advair, Atrovent -Follow up in 2 months

## 2016-10-16 NOTE — Assessment & Plan Note (Addendum)
Elevated blood pressure  Will increase lisinopril from 10 mg to 20 mg  Patient to follow up in about 2 months

## 2016-10-16 NOTE — Assessment & Plan Note (Signed)
Recent fall about one month ago, mechanical fall down the stairs. Patient was sent to rehabilitation for 20 days. Now patient is back at home. No concerns for any further resources per patient at this time

## 2016-10-16 NOTE — Assessment & Plan Note (Signed)
Currently on Advair, Atrovent and when necessary albuterol. Smokes 1 pack per day. On examination no increased work of breathing, pulse ox 92% at rest, wheezing throughout on lung field examination. Patient states that sometimes [name] has wheezes and sometimes [name] doesn't. [name] is not interested in being evaluated for oxygen needs [name] does not want home oxygen. We have discussed the need for PFTs for continued evaluation of her COPD progression at this point [name] wants to hold off and would just like to continue her current medication regimen. -Refill Advair, Atrovent -Follow up in 2 months

## 2016-10-16 NOTE — Assessment & Plan Note (Addendum)
Cardizem was stopped at last cardiology visit  Now on metropolol and warfarin (refilled by cardiology) who monitor patient's INR

## 2016-11-27 ENCOUNTER — Other Ambulatory Visit: Payer: Self-pay | Admitting: *Deleted

## 2016-11-27 DIAGNOSIS — J441 Chronic obstructive pulmonary disease with (acute) exacerbation: Secondary | ICD-10-CM

## 2016-11-27 MED ORDER — ALBUTEROL SULFATE HFA 108 (90 BASE) MCG/ACT IN AERS: 2.0000 | INHALATION_SPRAY | RESPIRATORY_TRACT | 11 refills | Status: AC | PRN

## 2016-11-27 MED ORDER — FLUTICASONE-SALMETEROL 500-50 MCG/DOSE IN AEPB: 1.0000 | INHALATION_SPRAY | Freq: Two times a day (BID) | RESPIRATORY_TRACT | 11 refills | Status: DC

## 2016-11-27 MED ORDER — IPRATROPIUM BROMIDE HFA 17 MCG/ACT IN AERS: 2.0000 | INHALATION_SPRAY | Freq: Four times a day (QID) | RESPIRATORY_TRACT | 5 refills | Status: AC

## 2016-12-21 ENCOUNTER — Other Ambulatory Visit: Payer: Self-pay | Admitting: Family Medicine

## 2016-12-21 ENCOUNTER — Encounter: Payer: Self-pay | Admitting: Family Medicine

## 2016-12-21 ENCOUNTER — Ambulatory Visit (INDEPENDENT_AMBULATORY_CARE_PROVIDER_SITE_OTHER): Payer: Medicare Other | Admitting: Family Medicine

## 2016-12-21 VITALS — BP 124/80 | HR 88 | Temp 97.6°F | Wt 87.8 lb

## 2016-12-21 DIAGNOSIS — R399 Unspecified symptoms and signs involving the genitourinary system: Secondary | ICD-10-CM | POA: Insufficient documentation

## 2016-12-21 LAB — POCT URINALYSIS DIP (MANUAL ENTRY)
BILIRUBIN UA: NEGATIVE
Glucose, UA: NEGATIVE mg/dL
Ketones, POC UA: NEGATIVE mg/dL
NITRITE UA: NEGATIVE
PH UA: 7 (ref 5.0–8.0)
Protein Ur, POC: 30 mg/dL — AB
Spec Grav, UA: 1.01 (ref 1.010–1.025)
Urobilinogen, UA: 0.2 E.U./dL

## 2016-12-21 LAB — POCT UA - MICROSCOPIC ONLY

## 2016-12-21 MED ORDER — CEPHALEXIN 500 MG PO CAPS: 500.0000 mg | ORAL_CAPSULE | Freq: Two times a day (BID) | ORAL | 0 refills | Status: DC

## 2016-12-21 MED ORDER — CEPHALEXIN 125 MG/5ML PO SUSR: 500.0000 mg | Freq: Two times a day (BID) | ORAL | 0 refills | Status: AC

## 2016-12-21 NOTE — Addendum Note (Signed)
Addended by: Jennette Bill on: 12/21/2016 10:05 AM   Modules accepted: Orders

## 2016-12-21 NOTE — Assessment & Plan Note (Signed)
  Patient with symptoms of burning, frequency, and blood. UA with leukocytes, blood, and protein. No concern for kidney infection at this time as patient is afebrile without CVA tenderness.   -sent urine for culture -treat with Keflex 500 mg BID for 7 days -follow up with PCP in 2 weeks to ensure blood in urine has resolved, patient is a smoker with recent weight loss- has discussed cancer screening with PCP but declined to undergo any testing. Would like to ensure this resolves.

## 2016-12-21 NOTE — Progress Notes (Signed)
    Subjective:    Patient ID: [name], female    DOB: 04-15-1940, 76 y.o.   MRN: [id number]   CC: dysuria  Patient reports symptoms of burning with urination and frequent urination began 1 week ago. The symptoms were intermittent so she was unsure if she should come it. She decided to come in because she has been seeing bright red blood when wiping after urinating. Denies blood with BM. Cannot say for sure if blood is coming from urethra or vagina. She feels like symptoms of burning/pain have slightly improved, blood persistent.   ROS: She denies fevers or chills. No flank pain. No nausea or vomiting.   Smoking status reviewed- current smoker   Objective:  BP 124/80   Pulse 88   Temp 97.6 F (36.4 C) (Oral)   Wt 87 lb 12.8 oz (39.8 kg)   SpO2 92%   BMI 15.55 kg/m  Vitals and nursing note reviewed  General: frail elderly lady in NAD Cardiac: RRR, clear S1 and S2, no murmurs, rubs, or gallops Respiratory: clear to auscultation bilaterally, no increased work of breathing Abdomen: soft, nondistended, no masses or organomegaly appreciated. Tender to palpation of suprapubic region. No CVA tenderness. Bowel sounds present Skin: warm and dry, no rashes noted Neuro: alert and oriented, no focal deficits  Assessment & Plan:    UTI symptoms  Patient with symptoms of burning, frequency, and blood. UA with leukocytes, blood, and protein. No concern for kidney infection at this time as patient is afebrile without CVA tenderness.   -sent urine for culture -treat with Keflex 500 mg BID for 7 days -follow up with PCP in 2 weeks to ensure blood in urine has resolved, patient is a smoker with recent weight loss- has discussed cancer screening with PCP but declined to undergo any testing. Would like to ensure this resolves.    Return in about 2 weeks (around 01/04/2017), or if symptoms worsen or fail to improve.   Dolores Patty, DO Family Medicine Resident PGY-2

## 2016-12-21 NOTE — Patient Instructions (Signed)
   It was nice to meet you today.  I sent an antibiotic to your pharmacy. Please take this twice a day for 7 days. Please follow up with your primary care doctor on 10/31 at 9:10 am.  Please call us or return to be seen if your symptoms worsen or if you develop fevers, chills, nausea or vomiting.   If you have questions or concerns please do not hesitate to call at [phone].  Dolores Patty, DO PGY-2, [facility] 12/21/2016 9:15 AM

## 2016-12-23 LAB — URINE CULTURE

## 2016-12-25 ENCOUNTER — Telehealth: Payer: Self-pay | Admitting: *Deleted

## 2016-12-25 NOTE — Telephone Encounter (Signed)
LM for patient to call back.  Just checking on the status of her UTI symptoms. Cozette Braggs,CMA

## 2016-12-25 NOTE — Telephone Encounter (Signed)
-----   Message from Tillman SersAngela C Riccio, DO sent at 12/24/2016 11:32 AM EDT ----- Jamal Maesried to call [name] to see how she was feeling- left message for her to call back and make appointment if she has still has UTI symptoms. Per culture results, sensitivity to cephalosporins not reported and unclear if this medication effective for her infection. Would switch to macrobid if patient still having symptoms.

## 2017-01-06 ENCOUNTER — Ambulatory Visit: Payer: Medicare Other | Admitting: Internal Medicine

## 2017-04-05 ENCOUNTER — Ambulatory Visit (INDEPENDENT_AMBULATORY_CARE_PROVIDER_SITE_OTHER): Payer: Medicare Other | Admitting: *Deleted

## 2017-04-05 DIAGNOSIS — Z5181 Encounter for therapeutic drug level monitoring: Secondary | ICD-10-CM | POA: Diagnosis not present

## 2017-04-05 DIAGNOSIS — I480 Paroxysmal atrial fibrillation: Secondary | ICD-10-CM

## 2017-04-05 LAB — POCT INR: INR: 5

## 2017-04-05 NOTE — Patient Instructions (Signed)
Description   Do not take any Coumadin today and No Coumadin tomorrow then continue taking 1/2 tablet daily except 1 tablet on Sundays. Recheck in 1 week.  Call with any new medications or procedures [phone]

## 2017-04-27 ENCOUNTER — Other Ambulatory Visit: Payer: Self-pay | Admitting: *Deleted

## 2017-04-27 MED ORDER — LISINOPRIL 10 MG PO TABS: 20.0000 mg | ORAL_TABLET | Freq: Every day | ORAL | 4 refills | Status: DC

## 2017-05-07 ENCOUNTER — Ambulatory Visit: Payer: Medicare Other | Admitting: *Deleted

## 2017-05-07 DIAGNOSIS — Z5181 Encounter for therapeutic drug level monitoring: Secondary | ICD-10-CM | POA: Diagnosis not present

## 2017-05-07 DIAGNOSIS — I480 Paroxysmal atrial fibrillation: Secondary | ICD-10-CM

## 2017-05-07 LAB — POCT INR: INR: 3.8

## 2017-05-07 NOTE — Patient Instructions (Signed)
Description   Do not take any Coumadin today march 1st then change coumadin dose to  1/2 tablet daily . Recheck in 2 weeks.  Call with any new medications or procedures [phone]

## 2017-05-21 ENCOUNTER — Ambulatory Visit: Payer: Medicare Other | Admitting: *Deleted

## 2017-05-21 DIAGNOSIS — I480 Paroxysmal atrial fibrillation: Secondary | ICD-10-CM

## 2017-05-21 DIAGNOSIS — Z5181 Encounter for therapeutic drug level monitoring: Secondary | ICD-10-CM

## 2017-05-21 LAB — POCT INR: INR: 2.9

## 2017-05-21 NOTE — Patient Instructions (Signed)
Description   Continue taking 1/2 tablet daily. Recheck in 3 weeks.  Call with any new medications or procedures [phone]

## 2017-05-24 ENCOUNTER — Other Ambulatory Visit: Payer: Self-pay | Admitting: *Deleted

## 2017-05-24 DIAGNOSIS — I4891 Unspecified atrial fibrillation: Secondary | ICD-10-CM

## 2017-05-24 MED ORDER — WARFARIN SODIUM 5 MG PO TABS: ORAL_TABLET | ORAL | 2 refills | Status: DC

## 2017-06-09 ENCOUNTER — Other Ambulatory Visit: Payer: Self-pay

## 2017-06-09 MED ORDER — METOPROLOL SUCCINATE ER 25 MG PO TB24: 25.0000 mg | ORAL_TABLET | Freq: Every day | ORAL | 3 refills | Status: DC

## 2017-06-11 ENCOUNTER — Ambulatory Visit (INDEPENDENT_AMBULATORY_CARE_PROVIDER_SITE_OTHER): Payer: Medicare Other | Admitting: *Deleted

## 2017-06-11 DIAGNOSIS — Z5181 Encounter for therapeutic drug level monitoring: Secondary | ICD-10-CM | POA: Diagnosis not present

## 2017-06-11 DIAGNOSIS — I480 Paroxysmal atrial fibrillation: Secondary | ICD-10-CM

## 2017-06-11 LAB — POCT INR: INR: 1.3

## 2017-06-11 NOTE — Patient Instructions (Signed)
Description   Today take 1 tablet and tomorrow take 1 tablet, Continue taking 1/2 tablet daily.  Recheck in 10 days. Call with any new medications or procedures [phone]

## 2017-06-21 ENCOUNTER — Ambulatory Visit (INDEPENDENT_AMBULATORY_CARE_PROVIDER_SITE_OTHER): Payer: Medicare Other | Admitting: *Deleted

## 2017-06-21 DIAGNOSIS — I480 Paroxysmal atrial fibrillation: Secondary | ICD-10-CM | POA: Diagnosis not present

## 2017-06-21 DIAGNOSIS — Z5181 Encounter for therapeutic drug level monitoring: Secondary | ICD-10-CM

## 2017-06-21 DIAGNOSIS — I4891 Unspecified atrial fibrillation: Secondary | ICD-10-CM | POA: Diagnosis not present

## 2017-06-21 LAB — POCT INR: INR: 2.4

## 2017-06-21 NOTE — Patient Instructions (Signed)
Description   Continue taking 1/2 tablet daily. Recheck in 3 weeks.  Call with any new medications or procedures [phone]     

## 2017-07-12 ENCOUNTER — Encounter: Payer: Self-pay | Admitting: Internal Medicine

## 2017-07-12 ENCOUNTER — Other Ambulatory Visit: Payer: Self-pay

## 2017-07-12 ENCOUNTER — Ambulatory Visit (INDEPENDENT_AMBULATORY_CARE_PROVIDER_SITE_OTHER): Payer: Medicare Other | Admitting: Internal Medicine

## 2017-07-12 DIAGNOSIS — G4709 Other insomnia: Secondary | ICD-10-CM

## 2017-07-12 DIAGNOSIS — G4700 Insomnia, unspecified: Secondary | ICD-10-CM | POA: Insufficient documentation

## 2017-07-12 MED ORDER — MELATONIN ER 10 MG PO TBCR: 10.0000 mg | EXTENDED_RELEASE_TABLET | Freq: Every day | ORAL | 1 refills | Status: AC

## 2017-07-12 NOTE — Assessment & Plan Note (Signed)
Likely combination of normal age-related changes as well as poor sleep hygiene. Discussed that Ambien is not safe for her age and is not in her best health interest. Also discussed negative impact of current sleep habits on her quality of sleep, specifically watching TV before bed and when waking up, drinking caffeine late into the afternoon, and drinking alcohol right before bed. Patient amenable to trying melatonin. Encouraged to f/u if no improvement, however would still not begin Ambien even if melatonin ineffective.

## 2017-07-12 NOTE — Patient Instructions (Addendum)
It was nice meeting you today [name]!  To help with sleep, you can begin taking one melatonin tablet every night before bed. Taking more than one tablet will not help you sleep any better, so only take one.   I would also recommend making some changes to your sleep habits, as I think this will have a significant impact on your quality of sleep. Do not watch TV in bed at all, and do not watch TV for at least 30 minutes before going to bed. Do not watch TV if you wake up in the middle of the night. Do not drink caffeine after 4PM. Continue to sleep in a dark, quiet room as you have been.   If you have any questions or concerns, please feel free to call the clinic.   Be well,  Dr. Natale Milch

## 2017-07-12 NOTE — Progress Notes (Signed)
   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 female presenting for insomnia.   Insomnia Ongoing for past couple years though recently worsening. Took a few of her son's Ambien and would like more. Says she has trouble both falling and staying asleep. Typically watches TV in bed for a couple hours before going to sleep. Also watches TV if she wakes up during the night. Drinks coffee throughout the day, with last drink around 4-5PM. Also drinks glass of wine nightly before bed. Says that her bedroom is quiet and dark. Has not tried anything other than Ambien to help with sleep.   Smoking status reviewed. Patient is current every day smoker.   Review of Systems See HPI.     Objective:  Physical Exam  Constitutional: She is oriented to person, place, and time. She appears well-developed and well-nourished. No distress.  Smells strongly of cigarette smoke  HENT:  Head: Normocephalic and atraumatic.  Pulmonary/Chest: Effort normal. No respiratory distress.  Neurological: She is alert and oriented to person, place, and time.  Psychiatric: She has a normal mood and affect. Her behavior is normal.      Assessment & Plan:  Insomnia Likely combination of normal age-related changes as well as poor sleep hygiene. Discussed that Ambien is not safe for her age and is not in her best health interest. Also discussed negative impact of current sleep habits on her quality of sleep, specifically watching TV before bed and when waking up, drinking caffeine late into the afternoon, and drinking alcohol right before bed. Patient amenable to trying melatonin. Encouraged to f/u if no improvement, however would still not begin Ambien even if melatonin ineffective.    Tarri Abernethy, MD, MPH PGY-3 [facility] Pager [phone]

## 2017-07-22 ENCOUNTER — Ambulatory Visit (INDEPENDENT_AMBULATORY_CARE_PROVIDER_SITE_OTHER): Payer: Medicare Other | Admitting: *Deleted

## 2017-07-22 DIAGNOSIS — I480 Paroxysmal atrial fibrillation: Secondary | ICD-10-CM | POA: Diagnosis not present

## 2017-07-22 DIAGNOSIS — Z5181 Encounter for therapeutic drug level monitoring: Secondary | ICD-10-CM

## 2017-07-22 LAB — POCT INR: INR: 3

## 2017-07-22 NOTE — Patient Instructions (Signed)
Description   Continue taking 1/2 tablet daily.  Recheck in 4 weeks. Call with any new medications or procedures [phone]

## 2017-08-06 ENCOUNTER — Other Ambulatory Visit: Payer: Self-pay

## 2017-08-09 MED ORDER — LISINOPRIL 10 MG PO TABS: 20.0000 mg | ORAL_TABLET | Freq: Every day | ORAL | 4 refills | Status: DC

## 2017-08-19 ENCOUNTER — Ambulatory Visit (INDEPENDENT_AMBULATORY_CARE_PROVIDER_SITE_OTHER): Payer: Medicare Other

## 2017-08-19 DIAGNOSIS — Z5181 Encounter for therapeutic drug level monitoring: Secondary | ICD-10-CM

## 2017-08-19 DIAGNOSIS — I480 Paroxysmal atrial fibrillation: Secondary | ICD-10-CM

## 2017-08-19 LAB — POCT INR: INR: 1.9 — AB (ref 2.0–3.0)

## 2017-08-19 NOTE — Patient Instructions (Addendum)
  Description   Today take 1 tablet then continue taking 1/2 tablet daily.  Recheck in 4 weeks. Call with any new medications or procedures [phone]

## 2017-09-16 ENCOUNTER — Ambulatory Visit (INDEPENDENT_AMBULATORY_CARE_PROVIDER_SITE_OTHER): Payer: Medicare Other | Admitting: *Deleted

## 2017-09-16 DIAGNOSIS — Z5181 Encounter for therapeutic drug level monitoring: Secondary | ICD-10-CM

## 2017-09-16 DIAGNOSIS — I480 Paroxysmal atrial fibrillation: Secondary | ICD-10-CM

## 2017-09-16 LAB — POCT INR: INR: 3.3 — AB (ref 2.0–3.0)

## 2017-09-16 NOTE — Patient Instructions (Signed)
Description   Do not take any Coumadin today then continue taking 1/2 tablet daily.  Recheck in 3 weeks. Call with any new medications or procedures [phone]

## 2017-10-27 ENCOUNTER — Other Ambulatory Visit: Payer: Self-pay | Admitting: Cardiology

## 2017-10-27 DIAGNOSIS — I4891 Unspecified atrial fibrillation: Secondary | ICD-10-CM

## 2017-10-29 ENCOUNTER — Ambulatory Visit (INDEPENDENT_AMBULATORY_CARE_PROVIDER_SITE_OTHER): Payer: Medicare Other

## 2017-10-29 DIAGNOSIS — Z5181 Encounter for therapeutic drug level monitoring: Secondary | ICD-10-CM

## 2017-10-29 DIAGNOSIS — I480 Paroxysmal atrial fibrillation: Secondary | ICD-10-CM | POA: Diagnosis not present

## 2017-10-29 LAB — POCT INR: INR: 2.1 (ref 2.0–3.0)

## 2017-10-29 NOTE — Patient Instructions (Signed)
Description   Continue on same dosage 1/2 tablet daily.  Recheck in 4 weeks. Call with any new medications or procedures [phone]

## 2017-11-05 ENCOUNTER — Other Ambulatory Visit: Payer: Self-pay | Admitting: Internal Medicine

## 2017-11-05 NOTE — Telephone Encounter (Signed)
LM with Brother for her to callback to make appt. Temperence Zenor, Maryjo RochesterJessica Dawn, CMA

## 2017-11-05 NOTE — Telephone Encounter (Signed)
Refilled Rx.  Patient should be seen in the next 1-2 months to follow up with Dr. Mauri ReadingMullis and have labs drawn.

## 2017-11-26 ENCOUNTER — Ambulatory Visit (INDEPENDENT_AMBULATORY_CARE_PROVIDER_SITE_OTHER): Payer: Medicare Other | Admitting: *Deleted

## 2017-11-26 DIAGNOSIS — Z5181 Encounter for therapeutic drug level monitoring: Secondary | ICD-10-CM

## 2017-11-26 DIAGNOSIS — I480 Paroxysmal atrial fibrillation: Secondary | ICD-10-CM | POA: Diagnosis not present

## 2017-11-26 LAB — POCT INR: INR: 5.1 — AB (ref 2.0–3.0)

## 2017-11-26 NOTE — Patient Instructions (Signed)
Description   Skip today and tomorrow dosage, then Continue on same dosage 1/2 tablet daily.  Recheck in 10 days.  Call with any new medications or procedures [phone]

## 2018-01-13 ENCOUNTER — Ambulatory Visit (INDEPENDENT_AMBULATORY_CARE_PROVIDER_SITE_OTHER): Payer: Medicare Other

## 2018-01-13 DIAGNOSIS — I480 Paroxysmal atrial fibrillation: Secondary | ICD-10-CM | POA: Diagnosis not present

## 2018-01-13 DIAGNOSIS — Z5181 Encounter for therapeutic drug level monitoring: Secondary | ICD-10-CM

## 2018-01-13 LAB — POCT INR: INR: 1.2 — AB (ref 2.0–3.0)

## 2018-01-13 NOTE — Patient Instructions (Signed)
Description   Take 1 tablet today and tomorrow, then start taking 1/2 tablet daily except 1 tablet on Mondays.  Recheck in 10 days.  Call with any new medications or procedures [phone]

## 2018-01-24 ENCOUNTER — Ambulatory Visit: Payer: Medicare Other | Admitting: *Deleted

## 2018-01-24 DIAGNOSIS — I480 Paroxysmal atrial fibrillation: Secondary | ICD-10-CM | POA: Diagnosis not present

## 2018-01-24 DIAGNOSIS — Z5181 Encounter for therapeutic drug level monitoring: Secondary | ICD-10-CM

## 2018-01-24 LAB — POCT INR: INR: 2.9 (ref 2.0–3.0)

## 2018-01-24 NOTE — Patient Instructions (Signed)
Description   Continue taking 1/2 tablet daily except 1 tablet on Mondays.  Recheck in 3 weeks.  Call with any new medications or procedures [phone]

## 2018-02-14 ENCOUNTER — Other Ambulatory Visit: Payer: Self-pay | Admitting: Family Medicine

## 2018-02-14 ENCOUNTER — Other Ambulatory Visit: Payer: Self-pay | Admitting: Internal Medicine

## 2018-02-14 ENCOUNTER — Telehealth: Payer: Self-pay

## 2018-02-14 NOTE — Telephone Encounter (Signed)
Contacted patient today about scheduling a visit to evaluate her eligibility to switch from her ICS-LABA inhaler (Advair) to an ICS-LAMA inhaler as recommended by the 2019 GOLD guidelines. Initially patient was reluctant due to cost. Called her back to let her know we could get her enrolled in an assistance program and provide samples to cover her until the coverage was effective. At this time she is not interested at all and would not like to be contacted further on the matter.  Erskine Emeryaniel Trindon Dorton, PharmD Candidate

## 2018-02-14 NOTE — Telephone Encounter (Signed)
Will refill for 1 month. Please call patient to schedule yearly appointment as it has been >1 year since she has had a physical or labs. Additionally, I am her new PCP and would like to establish relationship.

## 2018-02-15 NOTE — Telephone Encounter (Signed)
LM with son for her to call back and schedule an appt to have a physical and meet new pcp. [name], CMA

## 2018-02-24 ENCOUNTER — Other Ambulatory Visit: Payer: Self-pay | Admitting: Internal Medicine

## 2018-02-24 DIAGNOSIS — J441 Chronic obstructive pulmonary disease with (acute) exacerbation: Secondary | ICD-10-CM

## 2018-02-27 ENCOUNTER — Emergency Department (HOSPITAL_COMMUNITY): Payer: Medicare Other

## 2018-02-27 ENCOUNTER — Inpatient Hospital Stay (HOSPITAL_COMMUNITY)
Admission: EM | Admit: 2018-02-27 | Discharge: 2018-03-09 | DRG: 065 | Disposition: E | Payer: Medicare Other | Attending: Family Medicine | Admitting: Family Medicine

## 2018-02-27 ENCOUNTER — Encounter (HOSPITAL_COMMUNITY): Payer: Self-pay | Admitting: Emergency Medicine

## 2018-02-27 DIAGNOSIS — T45515A Adverse effect of anticoagulants, initial encounter: Secondary | ICD-10-CM | POA: Diagnosis present

## 2018-02-27 DIAGNOSIS — J449 Chronic obstructive pulmonary disease, unspecified: Secondary | ICD-10-CM | POA: Diagnosis present

## 2018-02-27 DIAGNOSIS — I615 Nontraumatic intracerebral hemorrhage, intraventricular: Principal | ICD-10-CM | POA: Diagnosis present

## 2018-02-27 DIAGNOSIS — W19XXXA Unspecified fall, initial encounter: Secondary | ICD-10-CM | POA: Diagnosis present

## 2018-02-27 DIAGNOSIS — Z681 Body mass index (BMI) 19 or less, adult: Secondary | ICD-10-CM

## 2018-02-27 DIAGNOSIS — Z515 Encounter for palliative care: Secondary | ICD-10-CM | POA: Diagnosis present

## 2018-02-27 DIAGNOSIS — Z881 Allergy status to other antibiotic agents status: Secondary | ICD-10-CM | POA: Diagnosis not present

## 2018-02-27 DIAGNOSIS — Z9181 History of falling: Secondary | ICD-10-CM

## 2018-02-27 DIAGNOSIS — R0689 Other abnormalities of breathing: Secondary | ICD-10-CM | POA: Diagnosis not present

## 2018-02-27 DIAGNOSIS — Z789 Other specified health status: Secondary | ICD-10-CM

## 2018-02-27 DIAGNOSIS — R4182 Altered mental status, unspecified: Secondary | ICD-10-CM | POA: Diagnosis present

## 2018-02-27 DIAGNOSIS — G4700 Insomnia, unspecified: Secondary | ICD-10-CM | POA: Diagnosis present

## 2018-02-27 DIAGNOSIS — I610 Nontraumatic intracerebral hemorrhage in hemisphere, subcortical: Secondary | ICD-10-CM | POA: Diagnosis present

## 2018-02-27 DIAGNOSIS — R402112 Coma scale, eyes open, never, at arrival to emergency department: Secondary | ICD-10-CM | POA: Diagnosis not present

## 2018-02-27 DIAGNOSIS — Y92009 Unspecified place in unspecified non-institutional (private) residence as the place of occurrence of the external cause: Secondary | ICD-10-CM | POA: Diagnosis not present

## 2018-02-27 DIAGNOSIS — M810 Age-related osteoporosis without current pathological fracture: Secondary | ICD-10-CM | POA: Diagnosis present

## 2018-02-27 DIAGNOSIS — R402342 Coma scale, best motor response, flexion withdrawal, at arrival to emergency department: Secondary | ICD-10-CM | POA: Diagnosis present

## 2018-02-27 DIAGNOSIS — I4891 Unspecified atrial fibrillation: Secondary | ICD-10-CM | POA: Diagnosis present

## 2018-02-27 DIAGNOSIS — F17210 Nicotine dependence, cigarettes, uncomplicated: Secondary | ICD-10-CM | POA: Diagnosis present

## 2018-02-27 DIAGNOSIS — G911 Obstructive hydrocephalus: Secondary | ICD-10-CM | POA: Diagnosis not present

## 2018-02-27 DIAGNOSIS — Z4682 Encounter for fitting and adjustment of non-vascular catheter: Secondary | ICD-10-CM | POA: Diagnosis not present

## 2018-02-27 DIAGNOSIS — R062 Wheezing: Secondary | ICD-10-CM | POA: Diagnosis not present

## 2018-02-27 DIAGNOSIS — I613 Nontraumatic intracerebral hemorrhage in brain stem: Secondary | ICD-10-CM | POA: Diagnosis present

## 2018-02-27 DIAGNOSIS — Z7901 Long term (current) use of anticoagulants: Secondary | ICD-10-CM | POA: Diagnosis not present

## 2018-02-27 DIAGNOSIS — R4020 Unspecified coma: Secondary | ICD-10-CM | POA: Diagnosis not present

## 2018-02-27 DIAGNOSIS — I619 Nontraumatic intracerebral hemorrhage, unspecified: Secondary | ICD-10-CM | POA: Diagnosis not present

## 2018-02-27 DIAGNOSIS — Z66 Do not resuscitate: Secondary | ICD-10-CM | POA: Diagnosis present

## 2018-02-27 DIAGNOSIS — R55 Syncope and collapse: Secondary | ICD-10-CM | POA: Diagnosis not present

## 2018-02-27 DIAGNOSIS — M1990 Unspecified osteoarthritis, unspecified site: Secondary | ICD-10-CM | POA: Diagnosis not present

## 2018-02-27 DIAGNOSIS — R402212 Coma scale, best verbal response, none, at arrival to emergency department: Secondary | ICD-10-CM | POA: Diagnosis not present

## 2018-02-27 DIAGNOSIS — I10 Essential (primary) hypertension: Secondary | ICD-10-CM | POA: Diagnosis not present

## 2018-02-27 DIAGNOSIS — I629 Nontraumatic intracranial hemorrhage, unspecified: Secondary | ICD-10-CM | POA: Diagnosis not present

## 2018-02-27 DIAGNOSIS — R404 Transient alteration of awareness: Secondary | ICD-10-CM | POA: Diagnosis not present

## 2018-02-27 DIAGNOSIS — R000 Tachycardia, unspecified: Secondary | ICD-10-CM | POA: Diagnosis not present

## 2018-02-27 DIAGNOSIS — Z79899 Other long term (current) drug therapy: Secondary | ICD-10-CM | POA: Diagnosis not present

## 2018-02-27 DIAGNOSIS — R918 Other nonspecific abnormal finding of lung field: Secondary | ICD-10-CM | POA: Diagnosis not present

## 2018-02-27 LAB — PROTIME-INR
INR: 3.4
INR: 1.23
Prothrombin Time: 33.8 s — ABNORMAL HIGH (ref 11.4–15.2)
Prothrombin Time: 15.3 s — ABNORMAL HIGH (ref 11.4–15.2)

## 2018-02-27 LAB — COMPREHENSIVE METABOLIC PANEL
ALT: 39 U/L (ref 0–44)
AST: 37 U/L (ref 15–41)
Albumin: 3.5 g/dL (ref 3.5–5.0)
Alkaline Phosphatase: 84 U/L (ref 38–126)
Anion gap: 12 (ref 5–15)
BUN: 10 mg/dL (ref 8–23)
CO2: 27 mmol/L (ref 22–32)
Calcium: 9.1 mg/dL (ref 8.9–10.3)
Chloride: 96 mmol/L — ABNORMAL LOW (ref 98–111)
Creatinine, Ser: 0.72 mg/dL (ref 0.44–1.00)
GFR calc Af Amer: >60 mL/min (ref >60)
GFR calc non Af Amer: >60 mL/min (ref >60)
Glucose, Bld: 176 mg/dL — ABNORMAL HIGH (ref 70–99)
Potassium: 4.2 mmol/L (ref 3.5–5.1)
Sodium: 135 mmol/L (ref 135–145)
Total Bilirubin: 0.8 mg/dL (ref 0.3–1.2)
Total Protein: 7.2 g/dL (ref 6.5–8.1)

## 2018-02-27 LAB — I-STAT CHEM 8, ED
BUN: 14 mg/dL (ref 8–23)
Calcium, Ion: 1.01 mmol/L — ABNORMAL LOW (ref 1.15–1.40)
Chloride: 99 mmol/L (ref 98–111)
Creatinine, Ser: 0.6 mg/dL (ref 0.44–1.00)
Glucose, Bld: 174 mg/dL — ABNORMAL HIGH (ref 70–99)
HCT: 50 % — ABNORMAL HIGH (ref 36.0–46.0)
Hemoglobin: 17 g/dL — ABNORMAL HIGH (ref 12.0–15.0)
Potassium: 5.7 mmol/L — ABNORMAL HIGH (ref 3.5–5.1)
Sodium: 134 mmol/L — ABNORMAL LOW (ref 135–145)
TCO2: 35 mmol/L — ABNORMAL HIGH (ref 22–32)

## 2018-02-27 LAB — DIFFERENTIAL
Abs Immature Granulocytes: 0.05 10*3/uL (ref 0.00–0.07)
Basophils Absolute: 0.1 10*3/uL (ref 0.0–0.1)
Basophils Relative: 1 %
Eosinophils Absolute: 0.1 10*3/uL (ref 0.0–0.5)
Eosinophils Relative: 1 %
Immature Granulocytes: 0 %
Lymphocytes Relative: 27 %
Lymphs Abs: 3.3 10*3/uL (ref 0.7–4.0)
Monocytes Absolute: 1 10*3/uL (ref 0.1–1.0)
Monocytes Relative: 8 %
Neutro Abs: 7.7 10*3/uL (ref 1.7–7.7)
Neutrophils Relative %: 63 %

## 2018-02-27 LAB — I-STAT TROPONIN, ED: Troponin i, poc: 0.02 ng/mL (ref 0.00–0.08)

## 2018-02-27 LAB — CBC
HCT: 49.4 % — ABNORMAL HIGH (ref 36.0–46.0)
Hemoglobin: 15.7 g/dL — ABNORMAL HIGH (ref 12.0–15.0)
MCH: 30.8 pg (ref 26.0–34.0)
MCHC: 31.8 g/dL (ref 30.0–36.0)
MCV: 96.9 fL (ref 80.0–100.0)
PLATELETS: 323 10*3/uL (ref 150–400)
RBC: 5.1 MIL/uL (ref 3.87–5.11)
RDW: 13.4 % (ref 11.5–15.5)
WBC: 12.3 10*3/uL — AB (ref 4.0–10.5)
nRBC: 0 % (ref 0.0–0.2)

## 2018-02-27 LAB — CBG MONITORING, ED: Glucose-Capillary: 158 mg/dL — ABNORMAL HIGH (ref 70–99)

## 2018-02-27 LAB — APTT: aPTT: 47 s — ABNORMAL HIGH (ref 24–36)

## 2018-02-27 LAB — ETHANOL: Alcohol, Ethyl (B): <10 mg/dL (ref <10)

## 2018-02-27 MED ORDER — GLYCOPYRROLATE 1 MG PO TABS
1.0000 mg | ORAL_TABLET | ORAL | Status: DC | PRN
  Filled 2018-02-27: qty 1

## 2018-02-27 MED ORDER — BIOTENE DRY MOUTH MT LIQD: 15.0000 mL | OROMUCOSAL | Status: DC | PRN

## 2018-02-27 MED ORDER — IOPAMIDOL (ISOVUE-370) INJECTION 76%
100.0000 mL | Freq: Once | INTRAVENOUS | Status: AC | PRN
  Administered 2018-02-27: 100 mL via INTRAVENOUS

## 2018-02-27 MED ORDER — PROTHROMBIN COMPLEX CONC HUMAN 500 UNITS IV KIT
1073.0000 [IU] | PACK | Status: AC
  Administered 2018-02-27: 1073 [IU] via INTRAVENOUS
  Filled 2018-02-27: qty 1073

## 2018-02-27 MED ORDER — HALOPERIDOL LACTATE 2 MG/ML PO CONC
0.5000 mg | ORAL | Status: DC | PRN
  Filled 2018-02-27: qty 0.3

## 2018-02-27 MED ORDER — HYDROMORPHONE HCL 1 MG/ML IJ SOLN: 0.5000 mg | INTRAMUSCULAR | Status: DC | PRN

## 2018-02-27 MED ORDER — VITAMIN K1 10 MG/ML IJ SOLN
10.0000 mg | INTRAVENOUS | Status: AC
  Administered 2018-02-27: 10 mg via INTRAVENOUS
  Filled 2018-02-27: qty 1

## 2018-02-27 MED ORDER — POLYVINYL ALCOHOL 1.4 % OP SOLN: 1.0000 [drp] | Freq: Four times a day (QID) | OPHTHALMIC | Status: DC | PRN

## 2018-02-27 MED ORDER — CLEVIDIPINE BUTYRATE 0.5 MG/ML IV EMUL
0.0000 mg/h | INTRAVENOUS | Status: DC
  Administered 2018-02-27: 1 mg/h via INTRAVENOUS

## 2018-02-27 MED ORDER — MORPHINE SULFATE (PF) 4 MG/ML IV SOLN
4.0000 mg | Freq: Once | INTRAVENOUS | Status: AC
  Administered 2018-02-27: 4 mg via INTRAVENOUS
  Filled 2018-02-27: qty 1

## 2018-02-27 MED ORDER — ONDANSETRON 4 MG PO TBDP: 4.0000 mg | ORAL_TABLET | Freq: Four times a day (QID) | ORAL | Status: DC | PRN

## 2018-02-27 MED ORDER — LORAZEPAM 2 MG/ML IJ SOLN
1.0000 mg | INTRAMUSCULAR | Status: DC | PRN
  Administered 2018-02-27: 1 mg via INTRAVENOUS
  Filled 2018-02-27: qty 1

## 2018-02-27 MED ORDER — ETOMIDATE 2 MG/ML IV SOLN
10.0000 mg | Freq: Once | INTRAVENOUS | Status: AC
  Administered 2018-02-27: 10 mg via INTRAVENOUS

## 2018-02-27 MED ORDER — GLYCOPYRROLATE 0.2 MG/ML IJ SOLN: 0.2000 mg | INTRAMUSCULAR | Status: DC | PRN

## 2018-02-27 MED ORDER — FENTANYL BOLUS VIA INFUSION
25.0000 ug | INTRAVENOUS | Status: DC | PRN
  Filled 2018-02-27: qty 25

## 2018-02-27 MED ORDER — MORPHINE SULFATE (PF) 4 MG/ML IV SOLN
8.0000 mg | Freq: Once | INTRAVENOUS | Status: DC
  Filled 2018-02-27: qty 2

## 2018-02-27 MED ORDER — HALOPERIDOL 0.5 MG PO TABS
0.5000 mg | ORAL_TABLET | ORAL | Status: DC | PRN
  Filled 2018-02-27: qty 1

## 2018-02-27 MED ORDER — FENTANYL 2500MCG IN NS 250ML (10MCG/ML) PREMIX INFUSION
25.0000 ug/h | INTRAVENOUS | Status: DC
  Filled 2018-02-27: qty 250

## 2018-02-27 MED ORDER — FENTANYL CITRATE (PF) 100 MCG/2ML IJ SOLN
75.0000 ug | Freq: Once | INTRAMUSCULAR | Status: DC
  Filled 2018-02-27 (×2): qty 2

## 2018-02-27 MED ORDER — HALOPERIDOL LACTATE 5 MG/ML IJ SOLN: 0.5000 mg | INTRAMUSCULAR | Status: DC | PRN

## 2018-02-27 MED ORDER — LORAZEPAM 1 MG PO TABS: 1.0000 mg | ORAL_TABLET | ORAL | Status: DC | PRN

## 2018-02-27 MED ORDER — ACETAMINOPHEN 650 MG RE SUPP: 650.0000 mg | Freq: Four times a day (QID) | RECTAL | Status: DC | PRN

## 2018-02-27 MED ORDER — LORAZEPAM 2 MG/ML PO CONC: 1.0000 mg | ORAL | Status: DC | PRN

## 2018-02-27 MED ORDER — ONDANSETRON HCL 4 MG/2ML IJ SOLN: 4.0000 mg | Freq: Four times a day (QID) | INTRAMUSCULAR | Status: DC | PRN

## 2018-02-27 MED ORDER — GLYCOPYRROLATE 0.2 MG/ML IJ SOLN
0.2000 mg | INTRAMUSCULAR | Status: DC | PRN
  Administered 2018-02-27 (×2): 0.2 mg via INTRAVENOUS
  Filled 2018-02-27 (×2): qty 1

## 2018-02-27 MED ORDER — ACETAMINOPHEN 325 MG PO TABS: 650.0000 mg | ORAL_TABLET | Freq: Four times a day (QID) | ORAL | Status: DC | PRN

## 2018-03-06 IMAGING — CT CT HEAD W/O CM
3 series · 16 of 47 positions shown, 19 images · non-contrast
Comparison: None.

CLINICAL DATA: Status post fall down 8 steps. Patient on Coumadin.
Concern for head injury. Initial encounter.

EXAM:
CT HEAD WITHOUT CONTRAST
TECHNIQUE: Contiguous axial images were obtained from the base of the skull
through the vertex without intravenous contrast.

[Series 3: head 5.0 h30s · axial · 0.40mm/px · z∈[-161,-16]mm · 10 of 35 slices shown, 13 images]
[im 3/35  brain]
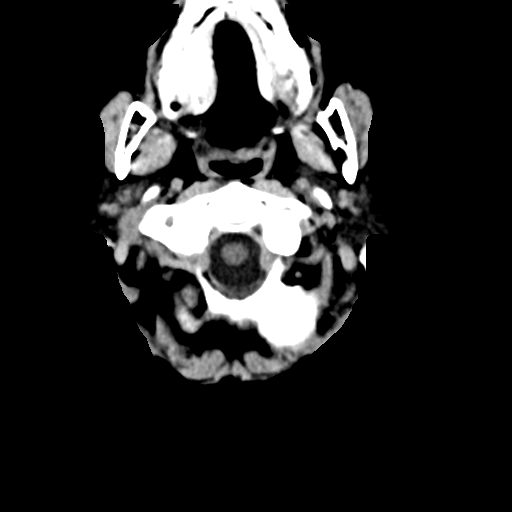
[im 3/35  bone]
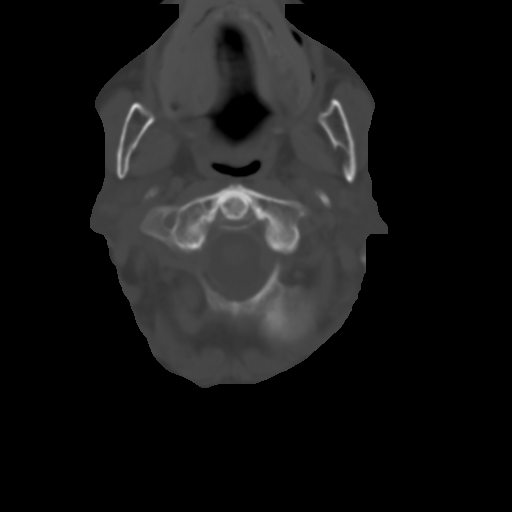
[im 6/35  brain]
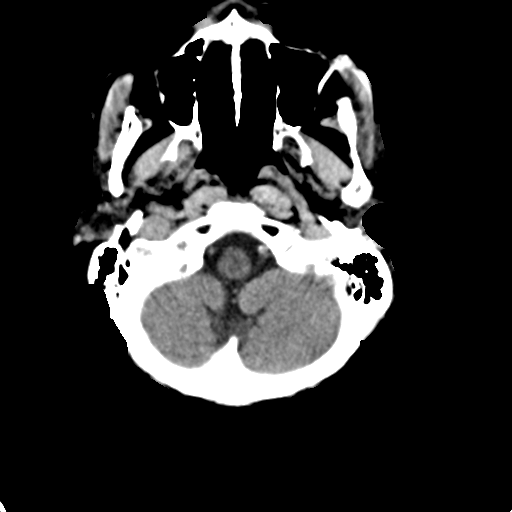
[im 10/35  brain]
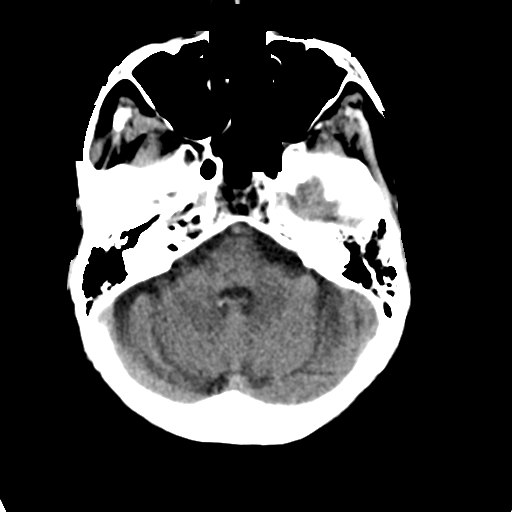
[im 12/35  brain]
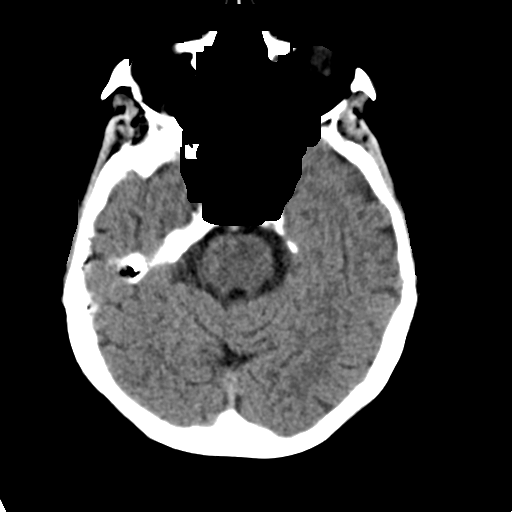
[im 16/35  brain]
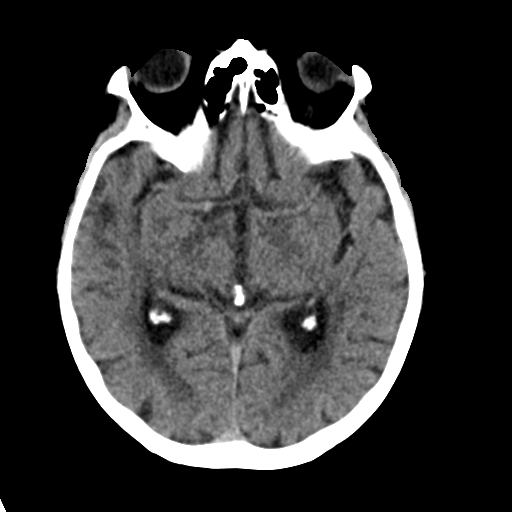
[im 16/35  bone]
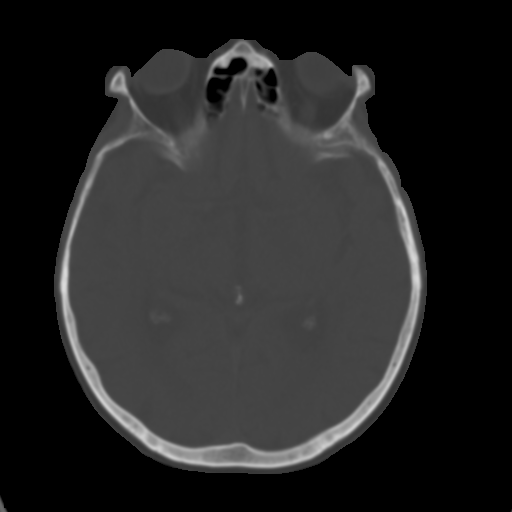
[im 19/35  brain]
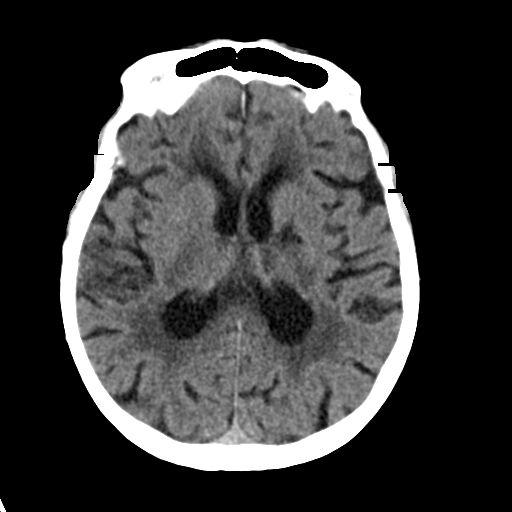
[im 23/35  brain]
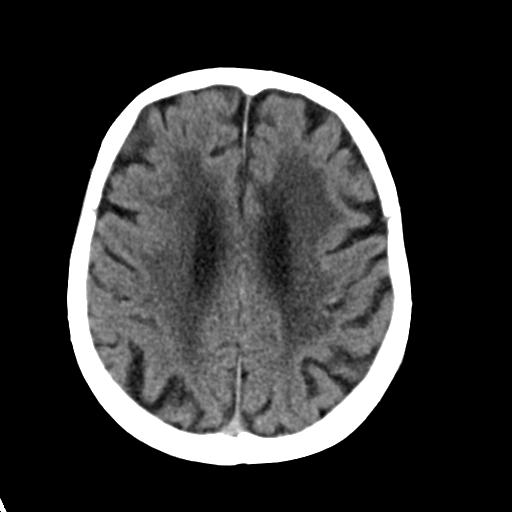
[im 26/35  brain]
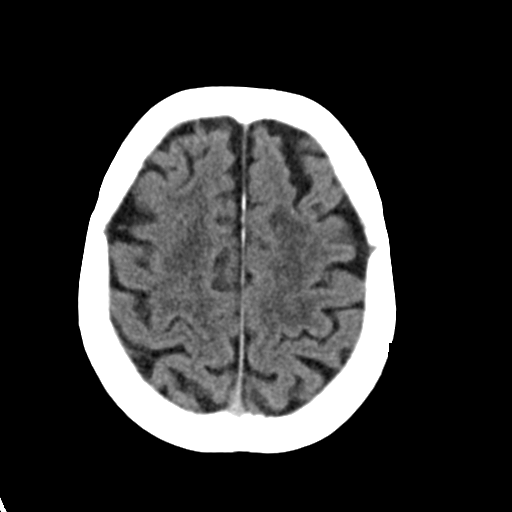
[im 29/35  brain]
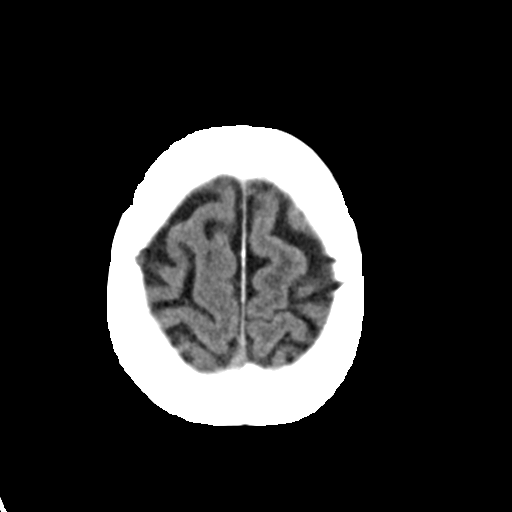
[im 29/35  bone]
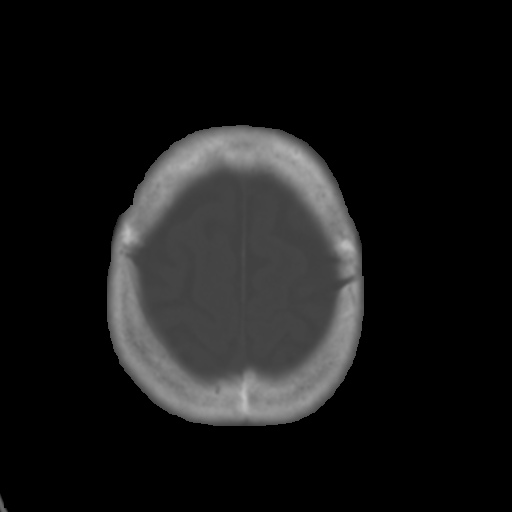
[im 32/35  brain]
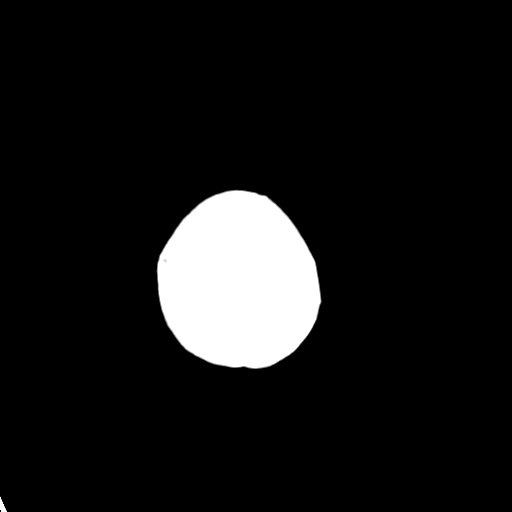

[Series 5: head 3.0 mpr cor · coronal · 0.32mm/px · 3 of 67 slices shown]
[im 23/67  brain]
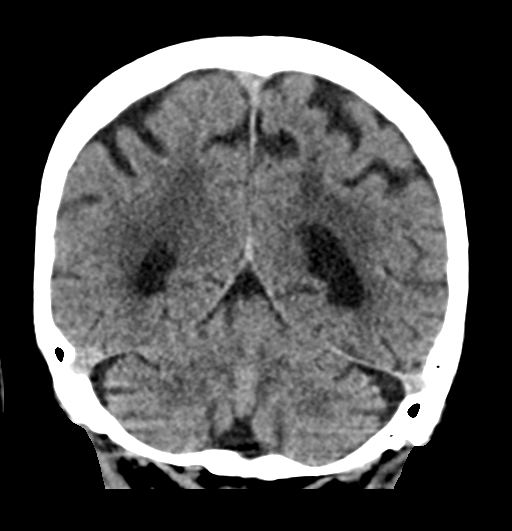
[im 30/67  brain]
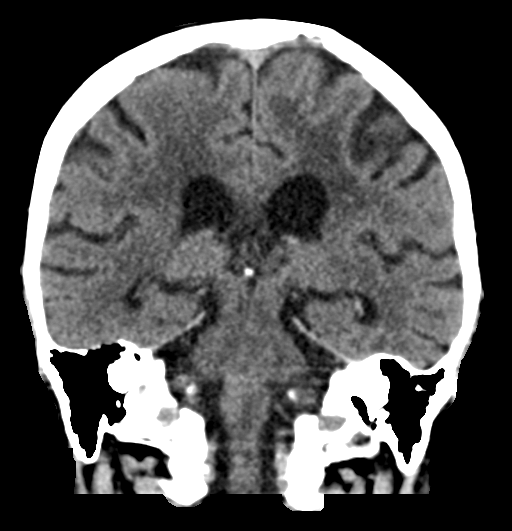
[im 37/67  brain]
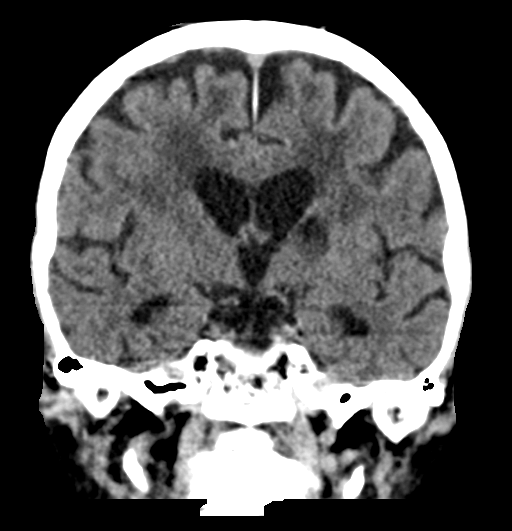

[Series 6: head 3.0 mpr sag · sagittal · 0.35mm/px · 3 of 55 slices shown]
[im 19/55  brain]
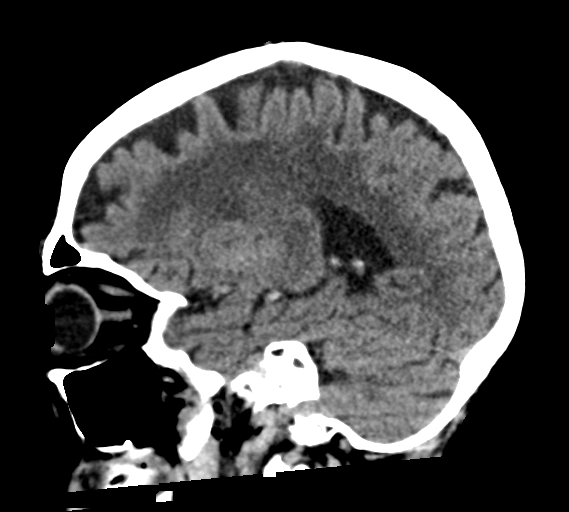
[im 28/55  brain]
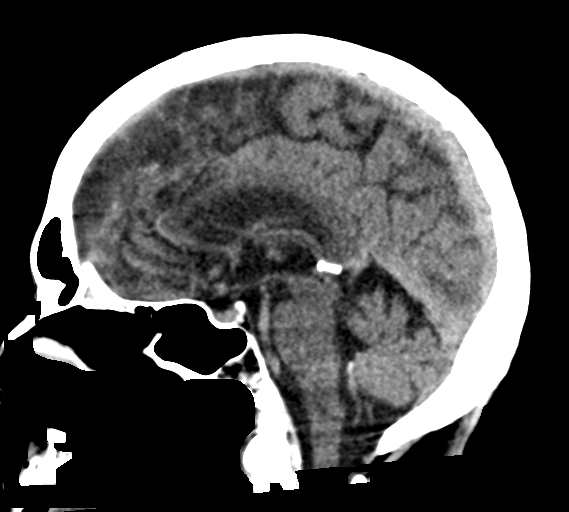
[im 37/55  brain]
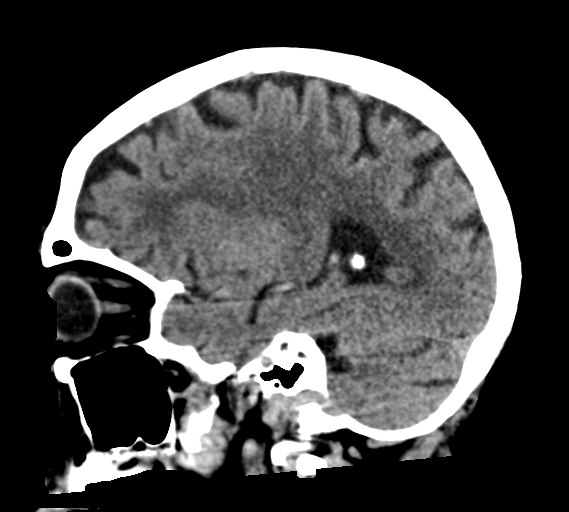

[16 of 47 positions shown; findings below may reference images not displayed]

FINDINGS: Brain: No evidence of acute infarction, hemorrhage, hydrocephalus,
extra-axial collection or mass lesion/mass effect.

Prominence of the ventricles and sulci reflects mild to moderate
cortical volume loss. Mild cerebellar atrophy is noted. Diffuse
periventricular and subcortical white matter change likely reflects
small vessel ischemic microangiopathy. A chronic lacunar infarct is
noted at the left basal ganglia.

The brainstem and fourth ventricle are within normal limits. The
cerebral hemispheres demonstrate grossly normal gray-white
differentiation. No mass effect or midline shift is seen.

Vascular: No hyperdense vessel or unexpected calcification.

Skull: There is no evidence of fracture; visualized osseous
structures are unremarkable in appearance.

Sinuses/Orbits: The orbits are within normal limits. The paranasal
sinuses and mastoid air cells are well-aerated.

Other: No significant soft tissue abnormalities are seen.
IMPRESSION: 1. No evidence of traumatic intracranial injury or fracture.
2. Mild to moderate cortical volume loss and diffuse small vessel
ischemic microangiopathy.
3. Chronic lacunar infarct at the left basal ganglia.

## 2018-03-09 NOTE — ED Notes (Signed)
Per the chaplain, patient's family has decided to wait consultation room until next steps in patient's plan of care are determined.

## 2018-03-09 NOTE — Discharge Summary (Signed)
[facility]  Patient name: [name] Medical record number: [id number] Date of birth: [birth year] Age: 78 y.o. Gender: female Date of Admission: 05-17-2017  Date of Death: 02/28/2018 Admitting Physician: Tobey GrimJeffrey H Walden, MD  Primary Care Provider: Joana ReamerMullis, Kiersten P, DO Consultants: Palliative Care  Brief Hospital Course:  Patient is a 78 yo female with known atrial fibrillation on coumadin, HTN, COPD and osteoporosis who presented to the hospital after a fall at home. On arrival to the ED, patient was unresponsive with a GCS of 6. Patient was intubated and neurology was consulted for code stroke. CT head showed acute 33cc left basal ganglia and thalamus hemorrhage with intraventricular extension, and acute severe obstructive hydrocephalus. Bleed thought to be due to supra therapeutic INR given patient was on warfarin. Vit K was given for reversal. Patient continue to be unresponsive with no spontaneous breathing. Family opted for comfort care, she was extubated and admitted to the floor when she was seen by palliative care who recommend hospice give very poor prognosis. Patient passed away early on 02/28/2018. Family medicine teaching service were notified and she was pronounced dead around 6:30 am.   Lovena Neighboursiallo, Leron Stoffers, MD 02/28/2018, 11:31 AM PGY-3, [facility]

## 2018-03-09 NOTE — ED Notes (Signed)
Patient's vitals continue to remain stable despite no mechanical ventilation. pts brother, son and daughter in law at bedside. Family made aware that meds can be given if patient begins to appear uncomfortable, family educated that morphine can decrease patient's respirations as well. Family advised they do not want patient to receive morphine at this time but will let this RN know if they change their mind

## 2018-03-09 NOTE — ED Notes (Signed)
Chaplain at bedside

## 2018-03-09 NOTE — ED Notes (Signed)
Dr Adela Lankfloyd, this RN and RRT at bedside. Patient extubated at this time

## 2018-03-09 NOTE — Consult Note (Signed)
Neurology Consultation  Reason for Consult: Code stroke-unequal pupils, unresponsive Referring Physician: Dr. Adela Lank  CC: Unresponsive, unequal pupils  History is obtained from: Chart, ED provider  HPI: [name] is a 78 y.o. female past medical history of atrial fibrillation on Coumadin, COPD, hypertension, brought in by EMS when EMS was called at home when a family member heard a thud in her room and found her unresponsive on the floor. The patient has a history of atrial fibrillation for which she takes Coumadin.  Her last known normal was around 1 AM when she was last seen normal by family and shortly after found unresponsive. Patient is unable to provide any history. I had a brief conversation with the brother on the phone, who is currently indisposed due to taking care of a family member-patient's son, who is disabled and needs placement before the brother can come here and have conversations.  On arrival to the ER, she was a GCS 3, had to be emergently intubated.  ED provider noted unequal pupils-left pupil 4 mm, right pupil 2 mm-bilaterally minimally reactive per report. Code stroke was activated after stabilizing her ABCs. On my examination, she had just received paralytics for intubation and my exam was limited.   LKW: 1 AM on Mar 10, 2018 tpa given?: no, massive ICH Premorbid modified Rankin scale (mRS):1 ICH score: 4  ROS: Unable to obtain due to mentation  Past Medical History:  Diagnosis Date  . Atrial fibrillation (HCC)   . Clavicle fracture 08/19/2016   left from fall at home   . COPD (chronic obstructive pulmonary disease) (HCC)   . Hypertension   . VAGINITIS, ATROPHIC 05/06/2006   Qualifier: Diagnosis of  By: Haydee Salter    . 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  Social History:   reports that she has been smoking cigarettes. She has been smoking about 1.00 pack per day. She  has never used smokeless tobacco. She reports current alcohol use. She reports that she does not use drugs.  Medications  Current Facility-Administered Medications:  .  clevidipine (CLEVIPREX) infusion 0.5 mg/mL, 0-21 mg/hr, Intravenous, Continuous, Milon Dikes, MD, Stopped at 2018-03-10 0230 .  fentaNYL (SUBLIMAZE) bolus via infusion 25 mcg, 25 mcg, Intravenous, Q1H PRN, Melene Plan, DO .  fentaNYL (SUBLIMAZE) injection 75 mcg, 75 mcg, Intravenous, Once, Adela Lank, Dan, DO .  fentaNYL in NS (36mcg/ml) infusion-PREMIX, 25-400 mcg/hr, Intravenous, Continuous, Adela Lank, Dan, DO .  phytonadione (VITAMIN K) 10 mg in dextrose 5 % 50 mL IVPB, 10 mg, Intravenous, STAT, Floyd, Dan, DO .  prothrombin complex conc human (KCENTRA) IVPB 1,073 Units, 1,073 Units, Intravenous, STAT, Melene Plan, DO  Current Outpatient Medications:  .  acetaminophen (TYLENOL) 325 MG tablet, Take 2 tablets (650 mg total) by mouth every 6 (six) hours as needed for mild pain (or Fever >/= 101)., Disp: 30 tablet, Rfl: 0 .  albuterol (PROVENTIL HFA) 108 (90 Base) MCG/ACT inhaler, Inhale 2 puffs into the lungs every 4 (four) hours as needed for wheezing. Inhale 2 puff using inhaler every four hours, Disp: 1 Inhaler, Rfl: 11 .  alendronate (FOSAMAX) 70 MG tablet, TAKE ONE TABLET BY MOUTH ONCE EVERY 7 DAYS. TAKE WITH A FULL GLASS OF WATER ON AN EMPTY STOMACH (Patient not taking: Reported on 08/23/2016), Disp: 4 tablet, Rfl: 11 .  cephALEXin (KEFLEX) 125 MG/5ML suspension, Take 20 mLs (500 mg total) by mouth 2 (two)  times daily., Disp: 300 mL, Rfl: 0 .  Fluticasone-Salmeterol (ADVAIR) 500-50 MCG/DOSE AEPB, INHALE 1 DOSE BY MOUTH TWICE DAILY, Disp: 60 each, Rfl: 0 .  ipratropium (ATROVENT HFA) 17 MCG/ACT inhaler, Inhale 2 puffs into the lungs every 6 (six) hours., Disp: 1 Inhaler, Rfl: 5 .  lisinopril (PRINIVIL,ZESTRIL) 10 MG tablet, TAKE 2 TABLETS BY MOUTH ONCE DAILY, Disp: 60 tablet, Rfl: 0 .  Melatonin 10 MG TBCR, Take 10 mg by  mouth at bedtime., Disp: 30 tablet, Rfl: 1 .  metoprolol succinate (TOPROL-XL) 25 MG 24 hr tablet, TAKE 1 TABLET BY MOUTH ONCE DAILY, Disp: 30 tablet, Rfl: 0 .  nicotine (NICODERM CQ - DOSED IN MG/24 HOURS) 14 mg/24hr patch, Place 7 mg onto the skin daily., Disp: , Rfl:  .  Vitamin D, Ergocalciferol, (DRISDOL) 50000 units CAPS capsule, Take 1 capsule (50,000 Units total) by mouth every 7 (seven) days., Disp: 8 capsule, Rfl: 0 .  warfarin (COUMADIN) 5 MG tablet, Take as directed by [facility]madin Clinic, Disp: 30 tablet, Rfl: 1  Exam: Current vital signs: BP (!) 228/89 (BP Location: Right Arm)   Pulse (!) 108   Temp (!) 97.3 F (36.3 C) (Temporal)   Resp 19   Ht 5\' 1"  (1.549 m)   Wt 38.6 kg   SpO2 100%   BMI 16.06 kg/m  Vital signs in last 24 hours: Temp:  [97.3 F (36.3 C)] 97.3 F (36.3 C) (12/22 0145) Pulse Rate:  [88-108] 108 (12/22 0203) Resp:  [19-20] 19 (12/22 0203) BP: (208-228)/(89-106) 228/89 (12/22 0203) SpO2:  [100 %] 100 % (12/22 0203) FiO2 (%):  [100 %] 100 % (12/22 0203) Weight:  [38.6 kg] 38.6 kg (12/22 0153) General: She is intubated, no sedation but just received paralytics for intubation. HEENT: Normocephalic, atraumatic Lungs: Clear to auscultation, barrel-shaped chest Abdomen: Nondistended Extremities: Warm well perfused Neurological exam Sedated intubated and paralyzed for intubation No spontaneous movements Breathing with the ventilator Pupils are 4 mm and fixed. No corneals No gag No oculocephalics No withdrawal to noxious stimulation NIHSS - 32  Labs I have reviewed labs in epic and the results pertinent to this consultation are:  CBC    Component Value Date/Time   WBC 12.3 (H) 05-28-2017 0155   RBC 5.10 05-28-2017 0155   HGB 17.0 (H) 05-28-2017 0206   HCT 50.0 (H) 05-28-2017 0206   PLT 323 05-28-2017 0155   MCV 96.9 05-28-2017 0155   MCH 30.8 05-28-2017 0155   MCHC 31.8 05-28-2017 0155   RDW 13.4 05-28-2017 0155   LYMPHSABS 3.3 05-28-2017  0155   MONOABS 1.0 05-28-2017 0155   EOSABS 0.1 05-28-2017 0155   BASOSABS 0.1 05-28-2017 0155    CMP     Component Value Date/Time   NA 134 (L) 05-28-2017 0206   K 5.7 (H) 05-28-2017 0206   CL 99 05-28-2017 0206   CO2 27 05-28-2017 0155   GLUCOSE 174 (H) 05-28-2017 0206   BUN 14 05-28-2017 0206   CREATININE 0.60 05-28-2017 0206   CREATININE 0.71 06/27/2015 1207   CALCIUM 9.1 05-28-2017 0155   PROT 7.2 05-28-2017 0155   ALBUMIN 3.5 05-28-2017 0155   AST 37 05-28-2017 0155   ALT 39 05-28-2017 0155   ALKPHOS 84 05-28-2017 0155   BILITOT 0.8 05-28-2017 0155   GFRNONAA >60 05-28-2017 0155   GFRNONAA 84 06/27/2015 1207   GFRAA >60 05-28-2017 0155   GFRAA >89 06/27/2015 1207   Imaging I have reviewed the images obtained CT-scan of the brain -  massive left basal ganglia hemorrhage with extension into the ventricles bilaterally causing acute severe obstructive hydrocephalus.  Severe chronic small vessel disease, superimposed interstitial edema/transepidermal flow of CSF.  basal ganglia  bleed volume greater than 30 cc.  Assessment:  78 year old woman past history of atrial fibrillation Coumadin, COPD, hypertension brought in by EMS for acute onset of unresponsiveness.  Noted to have unequal pupils by ER.  GCS 3 on arrival, requiring emergent intubation. My examination was limited due to recent intubation and paralytic administration. Noncontrast CT of the head shows a large left basal ganglia hemorrhage with extension into the ventricles bilaterally causing severe obstructive hydrocephalus. This bleed is likely going to be nonsurvivable. I spoke to brother Bernadene PersonFred Jones on the phone at [phone]. The brother is currently trying to situate patient's disabled son somewhere before he can come to the hospital. At this time, I do not think neurosurgery will be able to intervene, but a call can be placed to see if there is any medic and putting an EVD. From an ICH standpoint, she needs  tight blood pressure management as well as reversal of warfarin.   Impression: Left basal ganglia bleed with IVH and hydrocephalus-likely hypertensive and coagulopathy from Coumadin Atrial fibrillation-on Coumadin Coagulopathy due to Coumadin  Plan: Kcentra SBP<140 Repeat exam   ADDENDUM Examined again. Pupils still non reactive Not breathing over vent No noxious stimulation extends upper extremities and has jerking movements of the lower extremities. Discussed with the family again the poor prognosis given the extent of the bleed, hydrocephalus, Coumadin on board etc. Family is wanting to withdraw care and do compassionate extubation followed by comfort measures. Plan was relayed to the ED attending Please call neurology if you have any further questions.  We will be available as needed.   Present on admission ICH, coma  -- Milon DikesAshish Laelle Bridgett, MD [facility] Pager: [phone] If 7pm to 7am, please call on call as listed on AMION.  CRITICAL CARE ATTESTATION Performed by: Milon DikesAshish Jermal Dismuke, MD Total critical care time: 70 minutes Critical care time was exclusive of separately billable procedures and treating other patients and/or supervising APPs/Residents/Students Critical care was necessary to treat or prevent imminent or life-threatening deterioration due to ICH, coma This patient is critically ill and at significant risk for neurological worsening and/or death and care requires constant monitoring. Critical care was time spent personally by me on the following activities: development of treatment plan with patient and/or surrogate as well as nursing, discussions with consultants, evaluation of patient's response to treatment, examination of patient, obtaining history from patient or surrogate, ordering and performing treatments and interventions, ordering and review of laboratory studies, ordering and review of radiographic studies, pulse oximetry, re-evaluation of  patient's condition, participation in multidisciplinary rounds and medical decision making of high complexity in the care of this patient.

## 2018-03-09 NOTE — ED Notes (Signed)
Patient's family requested to be brought back into room after extubation, family at bedside at this time.

## 2018-03-09 NOTE — ED Notes (Signed)
Dr. Adela LankFloyd at bedside, no purposeful movement or response to painful stimuli noted. Vent settings turned down by MD, no spontaneous breaths taken by patient.

## 2018-03-09 NOTE — Code Documentation (Signed)
Code Stroke  Patient arrived in ED, pupils were uneven and GC3, patient was intubated for airway protection. SBP > 220. Taken for STAT CT HEAD + Acute 36 cc LEFT basal ganglia and thalamus hemorrhage with intraventricular extension. Acute severe obstructive hydrocephalus. Patient was taken back to TRAUMA C, started on Cleviprex but was only on it for 1 minute SBP were dropped into the 110-120s. K Centra infusing now.   Start Time 0157 End Time [phone]

## 2018-03-09 NOTE — ED Notes (Signed)
patient's vitals continue to remain stable without intervention

## 2018-03-09 NOTE — ED Notes (Signed)
Spontaneous respirations noted

## 2018-03-09 NOTE — ED Notes (Signed)
[facility] called to activate code stroke 

## 2018-03-09 NOTE — ED Notes (Signed)
Patient's family at bedside speaking with doctor Wilford Cornerrora at this time

## 2018-03-09 NOTE — ED Notes (Signed)
ED Provider at bedside. 

## 2018-03-09 NOTE — Procedures (Signed)
Extubation Procedure Note  Patient Details:   Name: [name] DOB: [birth year] MRN: [id number]   Airway Documentation:  Airway 7.5 mm (Active)  Secured at (cm) 23 cm 2018/01/22  2:05 AM  Measured From Teeth 2018/01/22  2:05 AM  Secured Location Right 2018/01/22  2:05 AM  Secured By [employer] T[employer] Holder 2018/01/22  2:05 AM  Site Condition Dry 2018/01/22  1:50 AM   Vent end date: (not recorded) Vent end time: (not recorded)   Evaluation  O2 sats: stable throughout Complications: No apparent complications Patient did tolerate procedure well. Bilateral Breath Sounds: Coarse crackles   No   Pt terminally extubated per family wishes and MD order from Dr Adela LankFloyd. Pt extubated to Rm air without any further complications. RN, RRT and MD(Floyd) at bedside.  Alexia FreestoneStockenauer, Joshuwa Vecchio F 2018/01/22, 5:41 AM

## 2018-03-09 NOTE — ED Notes (Signed)
Dr. Adela LankFloyd advised after speaking with family, they would like care withdrawn at this time.

## 2018-03-09 NOTE — H&P (Addendum)
[facility] Physical Service Pager: [phone]  Patient name: [name] Strand Medical record number: [id number] Date of birth: 04/12/1940 Age: 78 y.o. Gender: female  Primary Care Provider: Joana ReamerMullis, Kiersten P, DO Consultants: Neuro Code Status: DNR - comfort care  Chief Complaint: Altered mental status secondary to stroke, comfort care  Assessment and Plan: [name] Pangilinan is a 78 y.o. female presenting with altered mental status secondary to stroke.  Patient is admitted for comfort care. PMH is significant for A. Fib, osteoporosis, tobacco use disorder, HTN, COPD, arthritis, and history of falls.  Acute AMS, coma, hemorrhagic CVA: patient was altered and unresponsive. EMS gave the patient 10mg  albuterol, 0.5mg  atrovent and 125mg  solumedrol en route to the ED. GCS on arrival was 6 and the patient was intubated at 2:37am. Code stroke was called. CT head showed acute 33cc left basal ganglia and thalamus hemorrhage with intraventricular extension, and acute severe obstructive hydrocephalus. As the patient was previously on coumadin 10mg  Vitamin K was administered. She was showing no signs of taking spontaneous breaths, family decided to withdraw care and she was extubated around 5:15am. Due to diminished neurological response and poor prognosis the patient will be admitted for comfort care. Family is aware that patient's prognosis is hours to days,and they repeat that the patient would not want to be given life sustaining measures in this situation (brother, son and DIL present at bedside). Anticipate hospital death given exam on admission.  -Admit to palliative care, Attending Dr. Gwendolyn GrantWalden -Consult palliative care -Pain control with Fentanyl, tylenol - patient's family does NOT want morphine due concern for decreased respirations at present, happy to reassess if needed -Robinul PRN secretions -Haldol and Lorazepam PRN agitation -Zofran PRN  Nausea -Vitals once daily -Regular diet as tolerated -Turn/reposition patient every 2 hours PRN comfort -Ophthalmic drops for dry eyes -RN may pronouce  Chronic problems that will not be treated as patient is comfort care: Incidental pulmonary nodule found on CT COPD - discontinue albuterol, Advair, atrovent  Arthritis - patient takes tylenol, will continue to be give as part of comfort care HTN - discontinue lisinopril, metoprolol Insomnia - discontinue melatonin A. Fibrillation - discontinue warfarin Osteoporosis - discontinue Fosamax Tobacco Use Disorder - discontinue nicotine  FEN/GI: KVO Prophylaxis: None  Disposition: Admit to med-surg, Comfort Care, Anticipated death  History of Present Illness:  [name] Hankinson is a 78 y.o. female presenting with altered mental status. The patient was last in her normal state around 1am on Jul 19, 2017 when her family heard a "thud" upstairs. The patient was found down on the floor altered and EMS was called. Concerned for acute COPD exacerbation the patient was given albuterol, solumedrol, and Atrovent en route to the hospital.   Once in the ED a CT head was performed and revealed a 33cc hemorrhagic stroke. As the patient was on coumadin for a. Fib 10mg  vitamin K were administered in the ED and code stroke was called. The patient was intubated for about 3 hours during which time she showed no signs of spontaneous respirations. Family decided to withdraw treatment, so she was extubated but began to breathe spontaneously. The patient was admitted for comfort care and anticipated death.  Review Of Systems: Per HPI with the following additions:  Review of Systems  Constitutional:       Altered mental status, unresponsive  Musculoskeletal: Positive for falls.   Patient Active Problem List   Diagnosis Date Noted  . Hemorrhagic stroke (HCC) Jul 19, 2017  .  Insomnia 07/12/2017  . UTI symptoms 12/21/2016  . Mild malnutrition (HCC) 10/16/2016  .  Closed displaced fracture of lateral end of left clavicle   . Closed nondisplaced fracture of left patella   . Cerumen impaction 06/27/2015  . Dupuytren's contracture of hand 07/22/2011  . Atrial fibrillation (HCC) 04/09/2010  . CAROTID BRUIT 11/07/2008  . Osteoporosis 11/06/2008  . TOBACCO DEPENDENCE 05/06/2006  . HYPERTENSION, BENIGN SYSTEMIC 05/06/2006  . COPD (chronic obstructive pulmonary disease) (HCC) 05/06/2006  . ARTHRITIS 05/06/2006    Past Medical History: Past Medical History:  Diagnosis Date  . Atrial fibrillation (HCC)   . Clavicle fracture 08/19/2016   left from fall at home   . COPD (chronic obstructive pulmonary disease) (HCC)   . Hypertension   . VAGINITIS, ATROPHIC 05/06/2006   Qualifier: Diagnosis of  By: Haydee Salter    . ZENKER'S DIVERTICULUM 10/04/2006   Qualifier: Diagnosis of  By: Georgiana Shore  MD, Vernona Rieger      Past Surgical History: Past Surgical History:  Procedure Laterality Date  . HERNIA REPAIR      Social History: Social History   Tobacco Use  . Smoking status: Current Every Day Smoker    Packs/day: 1.00    Types: Cigarettes  . Smokeless tobacco: Never Used  . Tobacco comment: would like to try to quitt after 54 years!!!!!  Substance Use Topics  . Alcohol use: Yes    Comment: 1 glass of wine every day  . Drug use: No   Please also refer to relevant sections of EMR.  Family History: No family history on file.  Allergies and Medications: Allergies  Allergen Reactions  . Doxycycline Other (See Comments)    REACTION: GI upset   No current facility-administered medications on file prior to encounter.    Current Outpatient Medications on File Prior to Encounter  Medication Sig Dispense Refill  . acetaminophen (TYLENOL) 325 MG tablet Take 2 tablets (650 mg total) by mouth every 6 (six) hours as needed for mild pain (or Fever >/= 101). 30 tablet 0  . albuterol (PROVENTIL HFA) 108 (90 Base) MCG/ACT inhaler Inhale 2 puffs into the lungs  every 4 (four) hours as needed for wheezing. Inhale 2 puff using inhaler every four hours 1 Inhaler 11  . alendronate (FOSAMAX) 70 MG tablet TAKE ONE TABLET BY MOUTH ONCE EVERY 7 DAYS. TAKE WITH A FULL GLASS OF WATER ON AN EMPTY STOMACH (Patient not taking: Reported on 08/23/2016) 4 tablet 11  . cephALEXin (KEFLEX) 125 MG/5ML suspension Take 20 mLs (500 mg total) by mouth 2 (two) times daily. 300 mL 0  . Fluticasone-Salmeterol (ADVAIR) 500-50 MCG/DOSE AEPB INHALE 1 DOSE BY MOUTH TWICE DAILY 60 each 0  . ipratropium (ATROVENT HFA) 17 MCG/ACT inhaler Inhale 2 puffs into the lungs every 6 (six) hours. 1 Inhaler 5  . lisinopril (PRINIVIL,ZESTRIL) 10 MG tablet TAKE 2 TABLETS BY MOUTH ONCE DAILY 60 tablet 0  . Melatonin 10 MG TBCR Take 10 mg by mouth at bedtime. 30 tablet 1  . metoprolol succinate (TOPROL-XL) 25 MG 24 hr tablet TAKE 1 TABLET BY MOUTH ONCE DAILY 30 tablet 0  . nicotine (NICODERM CQ - DOSED IN MG/24 HOURS) 14 mg/24hr patch Place 7 mg onto the skin daily.    . Vitamin D, Ergocalciferol, (DRISDOL) 50000 units CAPS capsule Take 1 capsule (50,000 Units total) by mouth every 7 (seven) days. 8 capsule 0  . warfarin (COUMADIN) 5 MG tablet Take as directed by Coumadin Clinic 30 tablet 1  Objective: BP (!) 105/58   Pulse 66   Temp (!) 97.3 F (36.3 C) (Temporal)   Resp 17   Ht 5\' 1"  (1.549 m)   Wt 38.6 kg   SpO2 (!) 87%   BMI 16.06 kg/m   Physical Exam Constitutional:      Comments: frail  Cardiovascular:     Rate and Rhythm: Normal rate and regular rhythm.  Pulmonary:     Effort: Pulmonary effort is normal. No respiratory distress.     Breath sounds: Stridor (mild) present. No wheezing or rhonchi.  Musculoskeletal:        General: No swelling, deformity or signs of injury.     Right lower leg: No edema.     Left lower leg: No edema.  Neurological:     Mental Status: She is disoriented.     Cranial Nerves: Cranial nerve deficit present.    Labs and Imaging: CBC BMET   Recent Labs  Lab 07-01-17 0155 07-01-17 0206  WBC 12.3*  --   HGB 15.7* 17.0*  HCT 49.4* 50.0*  PLT 323  --    Recent Labs  Lab 07-01-17 0155 07-01-17 0206  NA 135 134*  K 4.2 5.7*  CL 96* 99  CO2 27  --   BUN 10 14  CREATININE 0.72 0.60  GLUCOSE 176* 174*  CALCIUM 9.1  --      Dollene ClevelandAnderson, Hannah C, DO January 04, 2018, 7:56 AM PGY-1, [facility] FPTS Intern pager: [phone], text pages welcome  FPTS Upper-Level Resident Addendum   I have independently interviewed and examined the patient. I have discussed the above with the original author and agree with their documentation. My edits for correction/addition/clarification are in blue. Please see also any attending notes.    Loni MuseKate Trammell Bowden, MD PGY-3, [facility] FPTS Service pager: [phone] (text pages welcome through [facility])

## 2018-03-09 NOTE — Consult Note (Signed)
Consultation Note Date: 30-Apr-2017   Patient Name: [name]  DOB: [birth year]  MRN: [id number]  Age / Sex: 78 y.o., female  PCP: Joana ReamerMullis, Kiersten P, DO Referring Physician: No att. providers found  Reason for Consultation: Terminal Care  HPI/Patient Profile: 78 y.o. female  with past medical history of a fib on coumadin, osteoporosis, tobacco use, HTN, COPD, arthritis, and falls admitted on 30-Apr-2017 with hemorrhagic CVA. CT head revealed acute 33cc left basal ganglia and thalamus hemorrhage with intraventricular extension, and acute severe obstructive hydrocephalus. Patient intubated in ED and family decided to withdraw care a few hours later. PMT consulted for end of life care.   Clinical Assessment and Goals of Care: I have reviewed medical records including EPIC notes, labs and imaging, received report from RN, and assessed the patient.  Called and spoke with patient's son, [place]had, and Chad's wife.   I introduced Palliative Medicine as specialized medical care for people living with serious illness. It focuses on providing relief from the symptoms and stress of a serious illness. The goal is to improve quality of life for both the patient and the family.  We discussed that patient appears comfortable and reviewed goals of care to be for comfort care only.  On my assessment, patient is unresponsive, non labored breathing - a few periods of apnea. Extremities are cool. Patient has not required PRN medications for comfort.   Shared assessment with son. He asks about patient transferring. He is unsure if he wants to try to get patient transferred [place] - currently there is a waiting list. We discussed keeping patient in the hospital and anticipating hospital death. He agrees.   Questions and concerns were addressed. The family was encouraged to call with questions or concerns.   Primary Decision Maker NEXT OF  KIN - son, [place]had    SUMMARY OF RECOMMENDATIONS    - patient comfortable, no PRNs needed - orders for PRN medications appropriate if needed - family unsure about residential hospice - anticipate hospital death - PMT will continue to follow for symptom management and family support - reassess transfer to residential hospice tomorrow  Code Status/Advance Care Planning:  DNR  Symptom Management:   PRN fentanyl, robinul, haldol, ativan  Palliative Prophylaxis:   Aspiration, Eye Care, Frequent Pain Assessment, Oral Care and Turn Reposition  Additional Recommendations (Limitations, Scope, Preferences):  Full Comfort Care  Psycho-social/Spiritual:   Desire for further Chaplaincy support:no  Additional Recommendations: Education on Hospice  Prognosis:   Hours - Days  Discharge Planning: Anticipated Hospital Death      Primary Diagnoses: Present on Admission: . Hemorrhagic stroke (HCC)   I have reviewed the medical record, interviewed the patient and family, and examined the patient. The following aspects are pertinent.  Past Medical History:  Diagnosis Date  . Atrial fibrillation (HCC)   . Clavicle fracture 08/19/2016   left from fall at home   . COPD (chronic obstructive pulmonary disease) (HCC)   . Hypertension   . VAGINITIS, ATROPHIC 05/06/2006   Qualifier: Diagnosis of  By: Haydee SalterKivett, Whitney    . ZENKER'S DIVERTICULUM 10/04/2006   Qualifier: Diagnosis of  By: Georgiana ShoreMayans  MD, Vernona RiegerLaura     Social History   Socioeconomic History  . Marital status: Widowed    Spouse name: Not on file  . Number of children: Not on file  . Years of education: Not on file  . Highest education level: Not on file  Occupational History  . Not  on file  Social Needs  . Financial resource strain: Not on file  . Food insecurity:    Worry: Not on file    Inability: Not on file  . Transportation needs:    Medical: Not on file    Non-medical: Not on file  Tobacco Use  . Smoking status:  Current Every Day Smoker    Packs/day: 1.00    Types: Cigarettes  . Smokeless tobacco: Never Used  . Tobacco comment: would like to try to quitt after 54 years!!!!!  Substance and Sexual Activity  . Alcohol use: Yes    Comment: 1 glass of wine every day  . Drug use: No  . Sexual activity: Not on file  Lifestyle  . Physical activity:    Days per week: Not on file    Minutes per session: Not on file  . Stress: Not on file  Relationships  . Social connections:    Talks on phone: Not on file    Gets together: Not on file    Attends religious service: Not on file    Active member of club or organization: Not on file    Attends meetings of clubs or organizations: Not on file    Relationship status: Not on file  Other Topics Concern  . Not on file  Social History Narrative  . Not on file   No family history on file. Scheduled Meds: . fentaNYL (SUBLIMAZE) injection  75 mcg Intravenous Once   Continuous Infusions: . fentaNYL infusion INTRAVENOUS Stopped (06-14-17 0345)   PRN Meds:.acetaminophen **OR** acetaminophen, fentaNYL, glycopyrrolate **OR** glycopyrrolate **OR** glycopyrrolate, haloperidol **OR** haloperidol **OR** haloperidol lactate, LORazepam **OR** [DISCONTINUED] LORazepam **OR** LORazepam, ondansetron **OR** ondansetron (ZOFRAN) IV, polyvinyl alcohol Allergies  Allergen Reactions  . Doxycycline Other (See Comments)    REACTION: GI upset   Review of Systems  Unable to perform ROS: Patient unresponsive    Physical Exam Constitutional:      General: She is not in acute distress.    Appearance: She is underweight.  HENT:     Head: Normocephalic and atraumatic.  Cardiovascular:     Rate and Rhythm: Regular rhythm. Tachycardia present.  Pulmonary:     Effort: No tachypnea or accessory muscle usage.     Comments: Periods of apnea Musculoskeletal:     Right lower leg: No edema.     Left lower leg: No edema.  Neurological:     Mental Status: She is unresponsive.       Vital Signs: BP (!) 105/58   Pulse 66   Temp (!) 97.3 F (36.3 C) (Temporal)   Resp 17   Ht 5\' 1"  (1.549 m)   Wt 38.6 kg   SpO2 (!) 87%   BMI 16.06 kg/m  Pain Scale: PAINAD       SpO2: SpO2: (!) 87 % O2 Device:SpO2: (!) 87 % O2 Flow Rate: .O2 Flow Rate (L/min): 15 L/min  IO: Intake/output summary: No intake or output data in the 24 hours ending 06-14-17 1003  LBM:   Baseline Weight: Weight: 38.6 kg Most recent weight: Weight: 38.6 kg     Palliative Assessment/Data: PPS 10%    Time Total: 30 minutes Greater than 50%  of this time was spent counseling and coordinating care related to the above assessment and plan.  Gerlean RenShae Lee Warnie Belair, DNP, AGNP-C Palliative Medicine Team [phone] Pager: [phone]

## 2018-03-09 NOTE — Progress Notes (Signed)
0600 received patient from ED. Labored breathing, unresponsive to voice or pain.  No family at bedside. Will continue to monitor.

## 2018-03-09 NOTE — ED Notes (Signed)
To CT with RN, RT and Stroke MD

## 2018-03-09 NOTE — ED Notes (Signed)
Admitting at bedside 

## 2018-03-09 NOTE — Progress Notes (Signed)
I responded to a page from the ED to provide spiritual support for the patient's family. I visited with the patient and family member and remained present as the physician gave an update on the patient's status. I provided spiritual care through pastoral presence, words of encouragement, and prayer.    06-08-2017 0400  Clinical Encounter Type  Visited With Patient and family together  Visit Type Spiritual support;ED;Trauma  Referral From Nurse  Consult/Referral To Chaplain  Spiritual Encounters  Spiritual Needs Prayer    Chaplain Dr Melvyn NovasMichael Shalese Strahan

## 2018-03-09 NOTE — ED Provider Notes (Addendum)
[facility]   CSN: [id number] Arrival date & time: 2017/11/23  0139     History   Chief Complaint Chief Complaint  Patient presents with  . Unresponsive    HPI [name] is a 78 y.o. female.  78 yo F with a cc of AMS.  Last seen normal at 1am.  Per EMS found unresponsive on the ground in her bedroom upstairs.  Assisting ventillations enroute.  Level 5 caveat altered mental status  The history is provided by the EMS personnel.  Illness  This is a new problem. The current episode started less than 1 hour ago. The problem occurs constantly. The problem has not changed since onset.Nothing aggravates the symptoms. Nothing relieves the symptoms. She has tried nothing for the symptoms. The treatment provided no relief.    Past Medical History:  Diagnosis Date  . Atrial fibrillation (HCC)   . Clavicle fracture 08/19/2016   left from fall at home   . COPD (chronic obstructive pulmonary disease) (HCC)   . Hypertension   . VAGINITIS, ATROPHIC 05/06/2006   Qualifier: Diagnosis of  By: Haydee SalterKivett, Whitney    . ZENKER'S DIVERTICULUM 10/04/2006   Qualifier: Diagnosis of  By: Georgiana ShoreMayans  MD, Vernona RiegerLaura      Patient Active Problem List   Diagnosis Date Noted  . Hemorrhagic stroke (HCC) 2017/06/09  . Insomnia 07/12/2017  . UTI symptoms 12/21/2016  . Mild malnutrition (HCC) 10/16/2016  . Closed displaced fracture of lateral end of left clavicle   . Closed nondisplaced fracture of left patella   . Cerumen impaction 06/27/2015  . Dupuytren's contracture of hand 07/22/2011  . Atrial fibrillation (HCC) 04/09/2010  . CAROTID BRUIT 11/07/2008  . Osteoporosis 11/06/2008  . TOBACCO DEPENDENCE 05/06/2006  . HYPERTENSION, BENIGN SYSTEMIC 05/06/2006  . COPD (chronic obstructive pulmonary disease) (HCC) 05/06/2006  . ARTHRITIS 05/06/2006    Past Surgical History:  Procedure Laterality Date  . HERNIA REPAIR       OB History   No obstetric  history on file.      Home Medications    Prior to Admission medications   Medication Sig Start Date End Date Taking? Authorizing Provider  acetaminophen (TYLENOL) 325 MG tablet Take 2 tablets (650 mg total) by mouth every 6 (six) hours as needed for mild pain (or Fever >/= 101). 08/20/16   Leland HerYoo, Elsia J, DO  albuterol (PROVENTIL HFA) 108 (90 Base) MCG/ACT inhaler Inhale 2 puffs into the lungs every 4 (four) hours as needed for wheezing. Inhale 2 puff using inhaler every four hours 11/27/16   Mikell, Antionette PolesAsiyah Zahra, MD  alendronate (FOSAMAX) 70 MG tablet TAKE ONE TABLET BY MOUTH ONCE EVERY 7 DAYS. TAKE WITH A FULL GLASS OF WATER ON AN EMPTY STOMACH Patient not taking: Reported on 08/23/2016 06/04/16   Berton BonMikell, Asiyah Zahra, MD  cephALEXin (KEFLEX) 125 MG/5ML suspension Take 20 mLs (500 mg total) by mouth 2 (two) times daily. 12/21/16   Tillman Sersiccio, Angela C, DO  Fluticasone-Salmeterol (ADVAIR) 500-50 MCG/DOSE AEPB INHALE 1 DOSE BY MOUTH TWICE DAILY 02/25/18   Mullis, Kiersten P, DO  ipratropium (ATROVENT HFA) 17 MCG/ACT inhaler Inhale 2 puffs into the lungs every 6 (six) hours. 11/27/16 03/21/19  Mikell, Antionette PolesAsiyah Zahra, MD  lisinopril (PRINIVIL,ZESTRIL) 10 MG tablet TAKE 2 TABLETS BY MOUTH ONCE DAILY 02/14/18   Mullis, Kiersten P, DO  Melatonin 10 MG TBCR Take 10 mg by mouth at bedtime. 07/12/17   Marquette SaaLancaster, Abigail Joseph, MD  metoprolol succinate (  TOPROL-XL) 25 MG 24 hr tablet TAKE 1 TABLET BY MOUTH ONCE DAILY 02/14/18   Mullis, Kiersten P, DO  nicotine (NICODERM CQ - DOSED IN MG/24 HOURS) 14 mg/24hr patch Place 7 mg onto the skin daily.    [provider]  Vitamin D, Ergocalciferol, (DRISDOL) 50000 units CAPS capsule Take 1 capsule (50,000 Units total) by mouth every 7 (seven) days. 07/09/15   Myra Rude, MD  warfarin (COUMADIN) 5 MG tablet Take as directed by [facility] 10/29/17   Lewayne Bunting, MD    Family History No family history on file.  Social History Social History    Tobacco Use  . Smoking status: Current Every Day Smoker    Packs/day: 1.00    Types: Cigarettes  . Smokeless tobacco: Never Used  . Tobacco comment: would like to try to quitt after 54 years!!!!!  Substance Use Topics  . Alcohol use: Yes    Comment: 1 glass of wine every day  . Drug use: No     Allergies   Doxycycline   Review of Systems Review of Systems  Unable to perform ROS: Patient unresponsive     Physical Exam Updated Vital Signs BP (!) 107/59   Pulse 66   Temp (!) 97.3 F (36.3 C) (Temporal)   Resp 17   Ht 5\' 1"  (1.549 m)   Wt 38.6 kg   SpO2 (!) 88%   BMI 16.06 kg/m   Physical Exam Vitals signs and nursing note reviewed.  Constitutional:      General: She is not in acute distress.    Appearance: She is well-developed. She is not diaphoretic.  HENT:     Head: Normocephalic and atraumatic.  Eyes:     Comments: Left pupil 4mm, R pupil 2mm.  Minimally reactive bilaterally.   Neck:     Musculoskeletal: Normal range of motion and neck supple.  Cardiovascular:     Rate and Rhythm: Normal rate and regular rhythm.     Heart sounds: No murmur. No friction rub. No gallop.   Pulmonary:     Effort: Pulmonary effort is normal.     Breath sounds: No wheezing or rales.  Abdominal:     General: There is no distension.     Palpations: Abdomen is soft.     Tenderness: There is no abdominal tenderness.  Musculoskeletal:        General: No tenderness.  Skin:    General: Skin is warm and dry.  Neurological:     Mental Status: She is alert.     GCS: GCS eye subscore is 1. GCS verbal subscore is 1. GCS motor subscore is 4.     Comments: Withdrawals from pain with bilateral lowers.  No response to upper extremities to pain.       ED Treatments / Results  Labs (all labs ordered are listed, but only abnormal results are displayed) Labs Reviewed  PROTIME-INR - Abnormal; Notable for the following components:      Result Value   Prothrombin Time 33.8 (*)     All other components within normal limits  APTT - Abnormal; Notable for the following components:   aPTT 47 (*)    All other components within normal limits  CBC - Abnormal; Notable for the following components:   WBC 12.3 (*)    Hemoglobin 15.7 (*)    HCT 49.4 (*)    All other components within normal limits  COMPREHENSIVE METABOLIC PANEL - Abnormal; Notable for the following  components:   Chloride 96 (*)    Glucose, Bld 176 (*)    All other components within normal limits  PROTIME-INR - Abnormal; Notable for the following components:   Prothrombin Time 15.3 (*)    All other components within normal limits  I-STAT CHEM 8, ED - Abnormal; Notable for the following components:   Sodium 134 (*)    Potassium 5.7 (*)    Glucose, Bld 174 (*)    Calcium, Ion 1.01 (*)    TCO2 35 (*)    Hemoglobin 17.0 (*)    HCT 50.0 (*)    All other components within normal limits  CBG MONITORING, ED - Abnormal; Notable for the following components:   Glucose-Capillary 158 (*)    All other components within normal limits  ETHANOL  DIFFERENTIAL  RAPID URINE DRUG SCREEN, HOSP PERFORMED  URINALYSIS, ROUTINE W REFLEX MICROSCOPIC  PROTIME-INR  PROTIME-INR  I-STAT TROPONIN, ED    EKG  ED ECG REPORT   Date: 03/07/2018  Rate: 92  Rhythm: normal sinus rhythm  QRS Axis: normal  Intervals: normal  ST/T Wave abnormalities: nonspecific ST changes  Conduction Disutrbances:none  Narrative Interpretation:   Old EKG Reviewed: unchanged  I have personally reviewed the EKG tracing and agree with the computerized printout as noted.  Radiology Ct Angio Head W Or Wo Contrast  Result Date: Mar 27, 2018 CLINICAL DATA:  Found down, altered mental status. Follow-up hemorrhage. EXAM: CT ANGIOGRAPHY HEAD AND NECK TECHNIQUE: Multidetector CT imaging of the head and neck was performed using the standard protocol during bolus administration of intravenous contrast. Multiplanar CT image reconstructions and MIPs were  obtained to evaluate the vascular anatomy. Carotid stenosis measurements (when applicable) are obtained utilizing NASCET criteria, using the distal internal carotid diameter as the denominator. CONTRAST:  ISOVUE-370 IOPAMIDOL (ISOVUE-370) INJECTION 76% COMPARISON:  CT HEAD Mar 27, 2018 FINDINGS: CTA NECK FINDINGS: AORTIC ARCH: Normal appearance of the thoracic arch, normal branch pattern. Mild calcific atherosclerosis aortic arch. The origins of the innominate, left Common carotid artery and subclavian artery are widely patent. RIGHT CAROTID SYSTEM: Common carotid artery is patent. Mild calcific atherosclerosis and intimal thickening of the carotid bifurcation without hemodynamically significant stenosis by NASCET criteria. Normal appearance of the internal carotid artery. LEFT CAROTID SYSTEM: Common carotid artery is patent. Mild calcific atherosclerosis of the carotid bifurcation without hemodynamically significant stenosis by NASCET criteria. Patent internal carotid artery, tonsillar loop. VERTEBRAL ARTERIES:Left vertebral artery is dominant. Normal appearance of the vertebral arteries, widely patent. SKELETON: No acute osseous process though bone windows have not been submitted. Severe C5-6 and C6-7 spondylosis. Old LEFT clavicle fracture. OTHER NECK: Soft tissues of the neck are nonacute though, not tailored for evaluation. Heterogeneous thyroid without dominant nodule. UPPER CHEST: Centrilobular emphysema. 2.3 cm spiculated upper lobe nodule. 4 mm LEFT upper lobe nodule. 6 mm RIGHT upper lobe pulmonary nodule. Life support lines in place. CTA HEAD FINDINGS: ANTERIOR CIRCULATION: Patent cervical internal carotid arteries, petrous, cavernous and supra clinoid internal carotid arteries. Patent anterior communicating artery. Patent anterior and middle cerebral arteries. Severe tandem stenoses bilateral ACA and to lesser extent RIGHT MCA. No large vessel occlusion, significant stenosis, contrast  extravasation or aneurysm. POSTERIOR CIRCULATION: Patent vertebral arteries, vertebrobasilar junction and basilar artery, as well as main branch vessels. Patent posterior cerebral arteries, moderate luminal irregularity. Severe stenosis LEFT P3 bilateral posterior communicating arteries present. No large vessel occlusion, significant stenosis, contrast extravasation or aneurysm. VENOUS SINUSES: Major dural venous sinuses are patent though not tailored  for evaluation on this angiographic examination. ANATOMIC VARIANTS: None. . DELAYED PHASE: Not performed. MIP images reviewed. IMPRESSION: CTA NECK: 1. No hemodynamically significant stenosis ICA's. Patent vertebral arteries. 2. **An incidental finding of potential clinical significance has been found. Multiple pulmonary nodules including 2.3 cm nodule LEFT upper lobe. Non-contrast chest CT at 3-6 months is recommended. If the nodules are stable at time of repeat CT, then future CT at 18-24 months (from today's scan) is considered optional for low-risk patients, but is recommended for high-risk patients. This recommendation follows the consensus statement: Guidelines for Management of Incidental Pulmonary Nodules Detected on CT Images: From the Fleischner Society 2017; Radiology 2017; 284:228-243.** CTA HEAD: 1. No emergent large vessel occlusion. 2. Advanced intracranial atherosclerosis; multifocal severe stenosis anterior and posterior circulation. Emphysema (ICD10-J43.9).  Aortic Atherosclerosis (ICD10-I70.0). Electronically Signed   By: Awilda Metro M.D.   On: 2018-03-03 02:36   Ct Angio Neck W Or Wo Contrast  Result Date: 03/03/2018 CLINICAL DATA:  Found down, altered mental status. Follow-up hemorrhage. EXAM: CT ANGIOGRAPHY HEAD AND NECK TECHNIQUE: Multidetector CT imaging of the head and neck was performed using the standard protocol during bolus administration of intravenous contrast. Multiplanar CT image reconstructions and MIPs were obtained to  evaluate the vascular anatomy. Carotid stenosis measurements (when applicable) are obtained utilizing NASCET criteria, using the distal internal carotid diameter as the denominator. CONTRAST:  ISOVUE-370 IOPAMIDOL (ISOVUE-370) INJECTION 76% COMPARISON:  CT HEAD 03/03/18 FINDINGS: CTA NECK FINDINGS: AORTIC ARCH: Normal appearance of the thoracic arch, normal branch pattern. Mild calcific atherosclerosis aortic arch. The origins of the innominate, left Common carotid artery and subclavian artery are widely patent. RIGHT CAROTID SYSTEM: Common carotid artery is patent. Mild calcific atherosclerosis and intimal thickening of the carotid bifurcation without hemodynamically significant stenosis by NASCET criteria. Normal appearance of the internal carotid artery. LEFT CAROTID SYSTEM: Common carotid artery is patent. Mild calcific atherosclerosis of the carotid bifurcation without hemodynamically significant stenosis by NASCET criteria. Patent internal carotid artery, tonsillar loop. VERTEBRAL ARTERIES:Left vertebral artery is dominant. Normal appearance of the vertebral arteries, widely patent. SKELETON: No acute osseous process though bone windows have not been submitted. Severe C5-6 and C6-7 spondylosis. Old LEFT clavicle fracture. OTHER NECK: Soft tissues of the neck are nonacute though, not tailored for evaluation. Heterogeneous thyroid without dominant nodule. UPPER CHEST: Centrilobular emphysema. 2.3 cm spiculated upper lobe nodule. 4 mm LEFT upper lobe nodule. 6 mm RIGHT upper lobe pulmonary nodule. Life support lines in place. CTA HEAD FINDINGS: ANTERIOR CIRCULATION: Patent cervical internal carotid arteries, petrous, cavernous and supra clinoid internal carotid arteries. Patent anterior communicating artery. Patent anterior and middle cerebral arteries. Severe tandem stenoses bilateral ACA and to lesser extent RIGHT MCA. No large vessel occlusion, significant stenosis, contrast extravasation or  aneurysm. POSTERIOR CIRCULATION: Patent vertebral arteries, vertebrobasilar junction and basilar artery, as well as main branch vessels. Patent posterior cerebral arteries, moderate luminal irregularity. Severe stenosis LEFT P3 bilateral posterior communicating arteries present. No large vessel occlusion, significant stenosis, contrast extravasation or aneurysm. VENOUS SINUSES: Major dural venous sinuses are patent though not tailored for evaluation on this angiographic examination. ANATOMIC VARIANTS: None. . DELAYED PHASE: Not performed. MIP images reviewed. IMPRESSION: CTA NECK: 1. No hemodynamically significant stenosis ICA's. Patent vertebral arteries. 2. **An incidental finding of potential clinical significance has been found. Multiple pulmonary nodules including 2.3 cm nodule LEFT upper lobe. Non-contrast chest CT at 3-6 months is recommended. If the nodules are stable at time of repeat CT, then  future CT at 18-24 months (from today's scan) is considered optional for low-risk patients, but is recommended for high-risk patients. This recommendation follows the consensus statement: Guidelines for Management of Incidental Pulmonary Nodules Detected on CT Images: From the Fleischner Society 2017; Radiology 2017; 284:228-243.** CTA HEAD: 1. No emergent large vessel occlusion. 2. Advanced intracranial atherosclerosis; multifocal severe stenosis anterior and posterior circulation. Emphysema (ICD10-J43.9).  Aortic Atherosclerosis (ICD10-I70.0). Electronically Signed   By: Awilda Metro M.D.   On: March 24, 2018 02:36   Ct C-spine No Charge  Result Date: March 24, 2018 CLINICAL DATA:  Found down.  Follow-up intracranial hemorrhage. EXAM: CT CERVICAL SPINE WITHOUT CONTRAST TECHNIQUE: Multidetector CT imaging of the cervical spine were reconstructed from CT angiogram head and neck from same day, reported separately. Multiplanar CT image reconstructions were also generated. COMPARISON:  CT HEAD August 20, 2017 and CT  cervical spine August 19, 2017 FINDINGS: ALIGNMENT: Straightened cervical lordosis. Minimal grade 1 C7-T1 anterolisthesis. SKULL BASE AND VERTEBRAE: Vertebral bodies intact. Severe C5-6 and C6-7 disc height loss with endplate sclerosis and marginal spurring consistent with degenerative discs. Multilevel moderate and severe facet arthropathy. C1-2 articulation maintained with severe osteoarthrosis and subchondral cyst formation. No destructive bony lesions. Old LEFT clavicle fracture. SOFT TISSUES AND SPINAL CANAL: Nonacute. Life support lines in place. DISC LEVELS: Mild canal stenosis C5-6. Severe LEFT C3-4, bilateral C4-5, bilateral C5-6, LEFT C6-7 neural foraminal narrowing. UPPER CHEST: Upper lobe pulmonary nodules as described on CTA NECK. Centrilobular emphysema. OTHER: None. IMPRESSION: 1. No acute fracture.  Minimal grade 1 C7-T1. 2. Multilevel severe neural foraminal narrowing. Emphysema (ICD10-J43.9). Electronically Signed   By: Awilda Metro M.D.   On: 03/24/2018 03:15   Dg Chest Port 1 View  Result Date: 03-24-2018 CLINICAL DATA:  Endotracheal tube placement. Patient found down. EXAM: PORTABLE CHEST 1 VIEW COMPARISON:  Chest radiograph performed 08/19/2016 FINDINGS: The patient's endotracheal tube is seen ending 5-6 cm above the carina. The lungs are well-aerated. Mild right infrahilar airspace opacity may reflect pneumonia. There is no evidence of pleural effusion or pneumothorax. The cardiomediastinal silhouette is within normal limits. No acute osseous abnormalities are seen. IMPRESSION: 1. Endotracheal tube seen ending 5-6 cm above the carina. 2. Mild right infrahilar airspace opacity may reflect pneumonia. Electronically Signed   By: Roanna Raider M.D.   On: 2018-03-24 02:06   Ct Head Code Stroke Wo Contrast  Result Date: Mar 24, 2018 CLINICAL DATA:  Code stroke. Found on bed room floor, altered mental status. History of hypertension and atrial fibrillation. EXAM: CT HEAD WITHOUT CONTRAST  TECHNIQUE: Contiguous axial images were obtained from the base of the skull through the vertex without intravenous contrast. COMPARISON:  CT HEAD August 20, 2016 FINDINGS: BRAIN: 4.1 x 4.1 x 4.1 cm (volume = 36 cc) LEFT basal ganglia and thalamus hemorrhage with intraventricular extension, ballooning of the lateral ventricles with severe hydrocephalus, hematocrit level bilateral occipital horns. Hemorrhage along medial RIGHT basal ganglia and to mid brain, ventrally displacing and flattening the pons. Hemorrhage distended fourth ventricle. No acute large vascular territory infarct. No abnormal extra-axial fluid collections. Basal cisterns are patent. VASCULAR: Moderate calcific atherosclerosis of the carotid siphons. SKULL: No skull fracture. No significant scalp soft tissue swelling. SINUSES/ORBITS: Trace paranasal sinus mucosal thickening. Mastoid air cells are well aerated.The included ocular globes and orbital contents are non-suspicious. OTHER: Life support lines in place. RIGHT sternocleidomastoid lipoma. ASPECTS [facility] Stroke Program Early CT Score) Not applicable. IMPRESSION: 1. Acute 36 cc LEFT basal ganglia and thalamus hemorrhage with intraventricular extension. Acute  severe obstructive hydrocephalus. 2. Severe chronic small vessel ischemic changes, superimposed interstitial edema/transependymal flow cerebral spinal fluid. 3. Critical Value/emergent results were called by telephone at the time of interpretation on Mar 28, 2017 at 2:15 am to Dr. Wilford CornerArora, Neurology, who verbally acknowledged these results. Electronically Signed   By: Awilda Metroourtnay  Bloomer M.D.   On: Mar 28, 2017 02:18    Procedures Procedure Name: Intubation Date/Time: Mar 28, 2017 2:37 AM Performed by: Melene PlanFloyd, Jessicaann Overbaugh, DO Pre-anesthesia Checklist: Patient identified, Emergency Drugs available, Suction available, Patient being monitored and Timeout performed Oxygen Delivery Method: Ambu bag Preoxygenation: Pre-oxygenation with 100%  oxygen Induction Type: Rapid sequence Ventilation: Mask ventilation without difficulty Laryngoscope Size: Glidescope Grade View: Grade I Tube size: 7.5 mm Number of attempts: 1 Airway Equipment and Method: Video-laryngoscopy Placement Confirmation: ETT inserted through vocal cords under direct vision,  CO2 detector and Breath sounds checked- equal and bilateral Secured at: 20 cm Tube secured with: ETT holder Difficulty Due To: Difficulty was unanticipated Future Recommendations: Recommend- induction with short-acting agent, and alternative techniques readily available      (including critical care time)  Medications Ordered in ED Medications  fentaNYL (SUBLIMAZE) injection 75 mcg (0 mcg Intravenous Hold 16-May-2017 0345)  fentaNYL 2500mcg in NS 250mL (3910mcg/ml) infusion-PREMIX (0 mcg/hr Intravenous Hold 16-May-2017 0345)  fentaNYL (SUBLIMAZE) bolus via infusion 25 mcg (has no administration in time range)  clevidipine (CLEVIPREX) infusion 0.5 mg/mL (0 mg/hr Intravenous Paused 16-May-2017 0230)  morphine 4 MG/ML injection 8 mg (0 mg Intravenous Hold 16-May-2017 0614)  acetaminophen (TYLENOL) tablet 650 mg (has no administration in time range)    Or  acetaminophen (TYLENOL) suppository 650 mg (has no administration in time range)  haloperidol (HALDOL) tablet 0.5 mg (has no administration in time range)    Or  haloperidol (HALDOL) 2 MG/ML solution 0.5 mg (has no administration in time range)    Or  haloperidol lactate (HALDOL) injection 0.5 mg (has no administration in time range)  ondansetron (ZOFRAN-ODT) disintegrating tablet 4 mg (has no administration in time range)    Or  ondansetron (ZOFRAN) injection 4 mg (has no administration in time range)  glycopyrrolate (ROBINUL) tablet 1 mg (has no administration in time range)    Or  glycopyrrolate (ROBINUL) injection 0.2 mg (has no administration in time range)    Or  glycopyrrolate (ROBINUL) injection 0.2 mg (has no administration in time  range)  antiseptic oral rinse (BIOTENE) solution 15 mL (has no administration in time range)  polyvinyl alcohol (LIQUIFILM TEARS) 1.4 % ophthalmic solution 1 drop (has no administration in time range)  HYDROmorphone (DILAUDID) injection 0.5 mg (has no administration in time range)  LORazepam (ATIVAN) tablet 1 mg (has no administration in time range)    Or  LORazepam (ATIVAN) injection 1 mg (has no administration in time range)  etomidate (AMIDATE) injection 10 mg (10 mg Intravenous Given 16-May-2017 0148)  prothrombin complex conc human (KCENTRA) IVPB 1,073 Units (0 Units Intravenous Stopped 16-May-2017 0352)  phytonadione (VITAMIN K) 10 mg in dextrose 5 % 50 mL IVPB (0 mg Intravenous Stopped 16-May-2017 0415)  iopamidol (ISOVUE-370) 76 % injection 100 mL (100 mLs Intravenous Contrast Given 16-May-2017 0218)  morphine 4 MG/ML injection 4 mg (4 mg Intravenous Given 16-May-2017 0514)     Initial Impression / Assessment and Plan / ED Course  I have reviewed the triage vital signs and the nursing notes.  Pertinent labs & imaging results that were available during my care of the patient were reviewed by me and considered in my medical decision  making (see chart for details).     78 yo F with a cc of AMS.  Patient last seen normal about an hour ago.  Now unresponsive.  Assisting with ventillations enroute.  Patient's GCS was a 6, intubated.  Taken urgently back to CT as a code stroke.  Found to have a very large intercerebral hemorrhage.  Nonsurvivable per neurology.  They will discuss with the family.  She is on Coumadin and so was reversed with Kcentra, started on a Cardene drip for her hypertension.  CRITICAL CARE Performed by: Rae Roam   Total critical care time: 80 minutes  Critical care time was exclusive of separately billable procedures and treating other patients.  Critical care was necessary to treat or prevent imminent or life-threatening deterioration.  Critical care was time  spent personally by me on the following activities: development of treatment plan with patient and/or surrogate as well as nursing, discussions with consultants, evaluation of patient's response to treatment, examination of patient, obtaining history from patient or surrogate, ordering and performing treatments and interventions, ordering and review of laboratory studies, ordering and review of radiographic studies, pulse oximetry and re-evaluation of patient's condition.  I have reassessed the patient and now she is no longer breathing over the vent her right pupil is now dilated and both pupils are unreactive.  She has not required any sedation and I doubt this is related to that.  Dr. Jerrell Belfast also independently evaluated the patient and he thinks that she is likely brain dead.  He will come back to perform a repeat exam and discussed with the family their wishes.   Repeat exam by the neurologist as well as me without pupillary response, some decorticate posturing was noted by neurology.  Discussed with family he would like to make the patient comfort care.  The patient was palliatively extubated in the ED and she is now breathing spontaneously.  Will discuss with family medicine for palliative care admission.  The patients results and plan were reviewed and discussed.   Any x-rays performed were independently reviewed by myself.   Differential diagnosis were considered with the presenting HPI.  Medications  fentaNYL (SUBLIMAZE) injection 75 mcg (0 mcg Intravenous Hold 03/09/18 0345)  fentaNYL in NS (38mcg/ml) infusion-PREMIX (0 mcg/hr Intravenous Hold March 09, 2018 0345)  fentaNYL (SUBLIMAZE) bolus via infusion 25 mcg (has no administration in time range)  clevidipine (CLEVIPREX) infusion 0.5 mg/mL (0 mg/hr Intravenous Paused 03/09/2018 0230)  morphine 4 MG/ML injection 8 mg (0 mg Intravenous Hold 2018-03-09 0614)  acetaminophen (TYLENOL) tablet 650 mg (has no administration in time range)     Or  acetaminophen (TYLENOL) suppository 650 mg (has no administration in time range)  haloperidol (HALDOL) tablet 0.5 mg (has no administration in time range)    Or  haloperidol (HALDOL) 2 MG/ML solution 0.5 mg (has no administration in time range)    Or  haloperidol lactate (HALDOL) injection 0.5 mg (has no administration in time range)  ondansetron (ZOFRAN-ODT) disintegrating tablet 4 mg (has no administration in time range)    Or  ondansetron (ZOFRAN) injection 4 mg (has no administration in time range)  glycopyrrolate (ROBINUL) tablet 1 mg (has no administration in time range)    Or  glycopyrrolate (ROBINUL) injection 0.2 mg (has no administration in time range)    Or  glycopyrrolate (ROBINUL) injection 0.2 mg (has no administration in time range)  antiseptic oral rinse (BIOTENE) solution 15 mL (has no administration in time range)  polyvinyl alcohol (  LIQUIFILM TEARS) 1.4 % ophthalmic solution 1 drop (has no administration in time range)  HYDROmorphone (DILAUDID) injection 0.5 mg (has no administration in time range)  LORazepam (ATIVAN) tablet 1 mg (has no administration in time range)    Or  LORazepam (ATIVAN) injection 1 mg (has no administration in time range)  etomidate (AMIDATE) injection 10 mg (10 mg Intravenous Given 21-Mar-2018 0148)  prothrombin complex conc human (KCENTRA) IVPB 1,073 Units (0 Units Intravenous Stopped March 21, 2018 0352)  phytonadione (VITAMIN K) 10 mg in dextrose 5 % 50 mL IVPB (0 mg Intravenous Stopped 2018-03-21 0415)  iopamidol (ISOVUE-370) 76 % injection 100 mL (100 mLs Intravenous Contrast Given Mar 21, 2018 0218)  morphine 4 MG/ML injection 4 mg (4 mg Intravenous Given 03/21/18 0514)    Vitals:   2018-03-21 0600 21-Mar-2018 0605 03/21/2018 0609 03-21-18 0610  BP: 110/63 110/61  (!) 107/59  Pulse: 72 64 65 66  Resp: 18 17 17 17   Temp:      TempSrc:      SpO2: (!) 88% (!) 88% (!) 88% (!) 88%  Weight:      Height:        Final diagnoses:  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    Final Clinical Impressions(s) / ED Diagnoses   Final diagnoses:  Nontraumatic intracerebral hemorrhage in brainstem, unspecified laterality [facility])    ED Discharge Orders    None       Melene Plan, DO 03/21/2018 0622    Melene Plan, DO 03/07/18 1550

## 2018-03-09 NOTE — ED Notes (Signed)
Patient's family advised they were going to step out for a little while, family made aware of patient's room number.

## 2018-03-09 NOTE — ED Notes (Signed)
[facility] called to cancel code stroke per Dr Arora 

## 2018-03-09 NOTE — ED Triage Notes (Signed)
Patient last seen normal at 0100 by family, was found at home on the floor in bedroom, responding to very noxious painful stimuli with minimal twitching as a response.  Patient was given 10mg  albuterol and 0.5mg  atrovent, with 125mg  solumedrol en route to ED via EMS.  Patient has unequal pupils upon arrival to ED.

## 2018-03-09 NOTE — ED Notes (Signed)
Dr. Wilford CornerArora speaking with patient's family in consultation room at this time

## 2018-03-09 DEATH — deceased

## 2018-03-10 ENCOUNTER — Telehealth: Payer: Self-pay | Admitting: Family Medicine

## 2018-03-10 NOTE — Telephone Encounter (Signed)
Death Certificate was dropped off for patient and has been placed in PCP's box for completion. Please use blue or black ink. Please no strike throughs, white out or highlight.  Please return to [place] once completed.

## 2018-03-15 NOTE — Telephone Encounter (Signed)
I just received this from my box. What is the deadline to have this completed by?

## 2018-03-15 NOTE — Telephone Encounter (Signed)
I made a mistake on the form and filled out a portion I wasn't supposed to. I will need a new form.

## 2018-03-15 NOTE — Telephone Encounter (Signed)
Phone call from [employer] to check on status of death certificate. Needs this as soon as possible please.  Ples Specter, RN [facility] RN)

## 2018-03-16 ENCOUNTER — Telehealth: Payer: Self-pay

## 2018-03-16 NOTE — Telephone Encounter (Signed)
Spoke to [employer] and they will bring over another death certificate to be filled out.  It is vital that we fill this out ASAP.  Marland Kitchen.Glennie HawkSimpson, Linzi Ohlinger R, CMA
